# Patient Record
Sex: Male | Born: 1945
Health system: Southern US, Community
[De-identification: ages and names within clinical notes are randomized; demographics above are authoritative.]

## PROBLEM LIST (undated history)

## (undated) DIAGNOSIS — E7800 Pure hypercholesterolemia, unspecified: Secondary | ICD-10-CM

## (undated) DIAGNOSIS — M5030 Other cervical disc degeneration, unspecified cervical region: Secondary | ICD-10-CM

## (undated) DIAGNOSIS — K635 Polyp of colon: Secondary | ICD-10-CM

## (undated) DIAGNOSIS — D45 Polycythemia vera: Secondary | ICD-10-CM

## (undated) DIAGNOSIS — G43909 Migraine, unspecified, not intractable, without status migrainosus: Secondary | ICD-10-CM

## (undated) DIAGNOSIS — N400 Enlarged prostate without lower urinary tract symptoms: Secondary | ICD-10-CM

## (undated) HISTORY — PX: EYE SURGERY: SHX253

---

## 2015-06-30 ENCOUNTER — Ambulatory Visit
Admission: RE | Admit: 2015-06-30 | Discharge: 2015-06-30 | Disposition: A | Discharge status: Home / Self Care | Payer: Medicare Other | Source: Ambulatory Visit | Attending: Family Medicine | Admitting: Family Medicine

## 2015-06-30 ENCOUNTER — Other Ambulatory Visit: Payer: Self-pay | Admitting: Family Medicine

## 2015-06-30 DIAGNOSIS — M549 Dorsalgia, unspecified: Secondary | ICD-10-CM

## 2015-07-14 ENCOUNTER — Other Ambulatory Visit: Payer: Self-pay | Admitting: Family Medicine

## 2015-07-14 DIAGNOSIS — M5030 Other cervical disc degeneration, unspecified cervical region: Secondary | ICD-10-CM

## 2015-07-15 ENCOUNTER — Ambulatory Visit
Admission: RE | Admit: 2015-07-15 | Discharge: 2015-07-15 | Disposition: A | Discharge status: Home / Self Care | Payer: Medicare Other | Source: Ambulatory Visit | Attending: Family Medicine | Admitting: Family Medicine

## 2015-07-15 ENCOUNTER — Other Ambulatory Visit: Payer: Self-pay | Admitting: Family Medicine

## 2015-07-15 DIAGNOSIS — T1592XA Foreign body on external eye, part unspecified, left eye, initial encounter: Secondary | ICD-10-CM

## 2015-07-15 DIAGNOSIS — M5030 Other cervical disc degeneration, unspecified cervical region: Secondary | ICD-10-CM

## 2015-11-14 ENCOUNTER — Encounter (HOSPITAL_COMMUNITY): Payer: Self-pay | Admitting: *Deleted

## 2015-11-14 ENCOUNTER — Emergency Department (HOSPITAL_COMMUNITY): Payer: Medicare Other

## 2015-11-14 ENCOUNTER — Emergency Department (HOSPITAL_COMMUNITY)
Admission: EM | Admit: 2015-11-14 | Discharge: 2015-11-14 | Disposition: A | Discharge status: Home / Self Care | Payer: Medicare Other | Attending: Emergency Medicine | Admitting: Emergency Medicine

## 2015-11-14 DIAGNOSIS — N451 Epididymitis: Secondary | ICD-10-CM | POA: Diagnosis not present

## 2015-11-14 DIAGNOSIS — N50812 Left testicular pain: Secondary | ICD-10-CM | POA: Diagnosis present

## 2015-11-14 DIAGNOSIS — R52 Pain, unspecified: Secondary | ICD-10-CM

## 2015-11-14 LAB — URINALYSIS, ROUTINE W REFLEX MICROSCOPIC
Glucose, UA: NEGATIVE mg/dL
Hgb urine dipstick: NEGATIVE
Ketones, ur: 15 mg/dL — AB
LEUKOCYTES UA: NEGATIVE
NITRITE: NEGATIVE
PH: 6 (ref 5.0–8.0)
Protein, ur: 30 mg/dL — AB
SPECIFIC GRAVITY, URINE: 1.035 — AB (ref 1.005–1.030)

## 2015-11-14 LAB — URINE MICROSCOPIC-ADD ON: RBC / HPF: NONE SEEN RBC/hpf (ref 0–5)

## 2015-11-14 MED ORDER — CEPHALEXIN 500 MG PO CAPS: 500.0000 mg | ORAL_CAPSULE | Freq: Four times a day (QID) | ORAL | Status: DC

## 2015-11-14 MED ORDER — MORPHINE SULFATE (PF) 4 MG/ML IV SOLN
4.0000 mg | Freq: Once | INTRAVENOUS | Status: AC
  Administered 2015-11-14: 4 mg via INTRAVENOUS
  Filled 2015-11-14: qty 1

## 2015-11-14 MED ORDER — CEPHALEXIN 250 MG PO CAPS
500.0000 mg | ORAL_CAPSULE | Freq: Once | ORAL | Status: AC
  Administered 2015-11-14: 500 mg via ORAL
  Filled 2015-11-14: qty 2

## 2015-11-14 MED ORDER — HYDROCODONE-ACETAMINOPHEN 5-325 MG PO TABS: 1.0000 | ORAL_TABLET | Freq: Four times a day (QID) | ORAL | Status: DC | PRN

## 2015-11-14 MED ORDER — ONDANSETRON HCL 4 MG/2ML IJ SOLN
4.0000 mg | Freq: Once | INTRAMUSCULAR | Status: AC
  Administered 2015-11-14: 4 mg via INTRAVENOUS
  Filled 2015-11-14: qty 2

## 2015-11-14 MED ORDER — SODIUM CHLORIDE 0.9 % IV SOLN: INTRAVENOUS | Status: DC

## 2015-11-14 MED ORDER — HYDROCODONE-ACETAMINOPHEN 5-325 MG PO TABS
1.0000 | ORAL_TABLET | Freq: Once | ORAL | Status: AC
  Administered 2015-11-14: 1 via ORAL
  Filled 2015-11-14: qty 1

## 2015-11-14 NOTE — ED Notes (Signed)
Pt reports scrotal swelling for several days. Pt states that it has worsened today and he has had difficulty urinating since this morning as well.

## 2015-11-14 NOTE — ED Provider Notes (Signed)
CSN: [id number]     Arrival date & time 11/14/15  1025 History   First MD Initiated Contact with Patient 11/14/15 1110     Chief Complaint  Patient presents with  . Testicle Pain     (Consider location/radiation/quality/duration/timing/severity/associated sxs/prior Treatment) Patient is a 69 y.o. male presenting with testicular pain. The history is provided by the patient.  Testicle Pain Pertinent negatives include no chest pain, no abdominal pain, no headaches and no shortness of breath.  Patient c/o left testicle pain for the past couple days. Symptoms gradual in onset, constant, persistent, moderate-severe. No hx same pain. No trauma to area. No dysuria, although states at times feels as if not emptying bladder fully. No abd distension. No nv. No fever or chills.       History reviewed. No pertinent past medical history. Past Surgical History  Procedure Laterality Date  . Eye surgery     No family history on file. Social History  Substance Use Topics  . Smoking status: Never Smoker   . Smokeless tobacco: None  . Alcohol Use: None    Review of Systems  Constitutional: Negative for fever and chills.  HENT: Negative for sore throat.   Eyes: Negative for redness.  Respiratory: Negative for shortness of breath.   Cardiovascular: Negative for chest pain.  Gastrointestinal: Negative for vomiting, abdominal pain and diarrhea.  Genitourinary: Positive for testicular pain. Negative for dysuria and flank pain.  Musculoskeletal: Negative for back pain and neck pain.  Skin: Negative for rash.  Neurological: Negative for headaches.  Hematological: Does not bruise/bleed easily.  Psychiatric/Behavioral: Negative for confusion.      Allergies  Review of patient's allergies indicates no known allergies.  Home Medications   Prior to Admission medications   Not on File   BP 110/79 mmHg  Pulse 62  Temp(Src) 97.3 F (36.3 C) (Axillary)  Resp 18  SpO2 100% Physical Exam   Constitutional: He is oriented to person, place, and time. He appears well-developed and well-nourished. No distress.  HENT:  Mouth/Throat: Oropharynx is clear and moist.  Eyes: Conjunctivae are normal.  Neck: Neck supple. No tracheal deviation present.  Cardiovascular: Normal rate.   Pulmonary/Chest: Effort normal. No accessory muscle usage. No respiratory distress.  Abdominal: Soft. Bowel sounds are normal. He exhibits no distension and no mass. There is no tenderness. There is no rebound and no guarding.  Genitourinary:  Normal ext genitalia. Left testicle tender diffusely and ?esp epididymus, ?mild swelling.  Musculoskeletal: Normal range of motion.  Neurological: He is alert and oriented to person, place, and time.  Skin: Skin is warm and dry. No rash noted. He is not diaphoretic.  Psychiatric: He has a normal mood and affect.  Nursing note and vitals reviewed.   ED Course  Procedures (including critical care time) Labs Review  Results for orders placed or performed during the hospital encounter of 11/14/15  Urinalysis, Routine w reflex microscopic (not at [facility])  Result Value Ref Range   Color, Urine AMBER (A) YELLOW   APPearance CLEAR CLEAR   Specific Gravity, Urine 1.035 (H) 1.005 - 1.030   pH 6.0 5.0 - 8.0   Glucose, UA NEGATIVE NEGATIVE mg/dL   Hgb urine dipstick NEGATIVE NEGATIVE   Bilirubin Urine SMALL (A) NEGATIVE   Ketones, ur 15 (A) NEGATIVE mg/dL   Protein, ur 30 (A) NEGATIVE mg/dL   Nitrite NEGATIVE NEGATIVE   Leukocytes, UA NEGATIVE NEGATIVE  Urine microscopic-add on  Result Value Ref Range   Squamous Epithelial /  LPF 0-5 (A) NONE SEEN   WBC, UA 0-5 0 - 5 WBC/hpf   RBC / HPF NONE SEEN 0 - 5 RBC/hpf   Bacteria, UA FEW (A) NONE SEEN   Urine-Other MUCOUS PRESENT    US Scrotum  11/14/2015  CLINICAL DATA:  Left testicular pain for 3 days EXAM: ULTRASOUND OF SCROTUM TECHNIQUE: Complete ultrasound examination of the testicles, epididymis, and other scrotal  structures was performed. COMPARISON:  None. FINDINGS: Right testicle Measurements: 4.9 x 2.6 x 3.1 cm. No mass or microlithiasis visualized. Left testicle Measurements: 4.2 x 2.4 x 3.1 cm. No mass or microlithiasis visualized. Right epididymis:  Normal in size and appearance. Left epididymis: The left epididymal tail is enlarged without increased flow. Hydrocele:  There is a right hydrocele. Varicocele:  There is a left varicocele. IMPRESSION: Normal bilateral testis. Left epididymal tail is enlarged without increased flow to suggest epididymitis. This is nonspecific. Right hydrocele.  Left varicocele. Electronically Signed   By: Abelardo Diesel M.D.   On: 11/14/2015 11:58   [name] Art/ven Flow Abd Pelv Doppler  11/14/2015  CLINICAL DATA:  Left testicular pain for 3 days EXAM: ULTRASOUND OF SCROTUM TECHNIQUE: Complete ultrasound examination of the testicles, epididymis, and other scrotal structures was performed. COMPARISON:  None. FINDINGS: Right testicle Measurements: 4.9 x 2.6 x 3.1 cm. No mass or microlithiasis visualized. Left testicle Measurements: 4.2 x 2.4 x 3.1 cm. No mass or microlithiasis visualized. Right epididymis:  Normal in size and appearance. Left epididymis: The left epididymal tail is enlarged without increased flow. Hydrocele:  There is a right hydrocele. Varicocele:  There is a left varicocele. IMPRESSION: Normal bilateral testis. Left epididymal tail is enlarged without increased flow to suggest epididymitis. This is nonspecific. Right hydrocele.  Left varicocele. Electronically Signed   By: Abelardo Diesel M.D.   On: 11/14/2015 11:58      I have personally reviewed and evaluated these images and lab results as part of my medical decision-making.    MDM   Iv ns. Pt requests pain med. Morphine iv. zofran iv.  Ultrasound.  Reviewed nursing notes and prior charts for additional history.   Bacteria in urine, and markedly tender left epididymus - will culture urine and rx.   U/s neg  for torsion.  Recheck pain improved.  No nv.    Recheck pt.  No abd or flank pain. abd soft nt. Afeb. Pt indicates feels ready for d/c.       Lajean Saver, MD 11/14/15 1359

## 2015-11-14 NOTE — Discharge Instructions (Signed)
It was our pleasure to provide your ER care today - we hope that you feel better.  Drink plenty of fluids.  Take motrin or aleve as need for pain. You may also take hydrocodone as need for pain. No driving when taking hydrocodone. Also, do not take tylenol or acetaminophen containing medication when taking hydrocodone.  Take keflex (antibiotic) as prescribed.  Follow up with your doctor in the next couple days for recheck if symptoms fail to improve/resolve.  Return to ER right away if worse, new symptoms, fevers, severe or intractable pain, severe abdominal or flank pain, other concern.  You were given pain medication in the ER - no driving for the next 4 hours.    Epididymitis Epididymitis is swelling (inflammation) of the epididymis. The epididymis is a cord-like structure that is located along the top and back part of the testicle. It collects and stores sperm from the testicle. This condition can also cause pain and swelling of the testicle and scrotum. Symptoms usually start suddenly (acute epididymitis). Sometimes epididymitis starts gradually and lasts for a while (chronic epididymitis). This type may be harder to treat. CAUSES In men 93 and younger, this condition is usually caused by a bacterial infection or sexually transmitted disease (STD), such as:  Gonorrhea.  Chlamydia.  In men 77 and older who do not have anal sex, this condition is usually caused by bacteria from a blockage or abnormalities in the urinary system. These can result from:  Having a tube placed into the bladder (urinary catheter).  Having an enlarged or inflamed prostate gland.  Having recent urinary tract surgery. In men who have a condition that weakens the body's defense system (immune system), such as HIV, this condition can be caused by:   Other bacteria, including tuberculosis and syphilis.  Viruses.  Fungi. Sometimes this condition occurs without infection. That may happen if urine flows  backward into the epididymis after heavy lifting or straining. RISK FACTORS This condition is more likely to develop in men:  Who have unprotected sex with more than one partner.  Who have anal sex.   Who have recently had surgery.   Who have a urinary catheter.  Who have urinary problems.  Who have a suppressed immune system. SYMPTOMS  This condition usually begins suddenly with chills, fever, and pain behind the scrotum and in the testicle. Other symptoms include:   Swelling of the scrotum, testicle, or both.  Pain whenejaculatingor urinating.  Pain in the back or belly.  Nausea.  Itching and discharge from the penis.  Frequent need to pass urine.  Redness and tenderness of the scrotum. DIAGNOSIS Your health care provider can diagnose this condition based on your symptoms and medical history. Your health care provider will also do a physical exam to ask about your symptoms and check your scrotum and testicle for swelling, pain, and redness. You may also have other tests, including:   Examination of discharge from the penis.  Urine tests for infections, such as STDs.  Your health care provider may test you for other STDs, including HIV. TREATMENT Treatment for this condition depends on the cause. If your condition is caused by a bacterial infection, oral antibiotic medicine may be prescribed. If the bacterial infection has spread to your blood, you may need to receive IV antibiotics. Nonbacterial epididymitis is treated with home care that includes bed rest and elevation of the scrotum. Surgery may be needed to treat:  Bacterial epididymitis that causes pus to build up in the  scrotum (abscess).  Chronic epididymitis that has not responded to other treatments. HOME CARE INSTRUCTIONS Medicines  Take over-the-counter and prescription medicines only as told by your health care provider.   If you were prescribed an antibiotic medicine, take it as told by your  health care provider. Do not stop taking the antibiotic even if your condition improves. Sexual Activity  If your epididymitis was caused by an STD, avoid sexual activity until your treatment is complete.  Inform your sexual partner or partners if you test positive for an STD. They may need to be treated.Do not engage in sexual activity with your partner or partners until their treatment is completed. General Instructions  Return to your normal activities as told by your health care provider. Ask your health care provider what activities are safe for you.  Keep your scrotum elevated and supported while resting. Ask your health care provider if you should wear a scrotal support, such as a jockstrap. Wear it as told by your health care provider.  If directed, apply ice to the affected area:   Put ice in a plastic bag.  Place a towel between your skin and the bag.  Leave the ice on for 20 minutes, 2-3 times per day.  Try taking a sitz bath to help with discomfort. This is a warm water bath that is taken while you are sitting down. The water should only come up to your hips and should cover your buttocks. Do this 3-4 times per day or as told by your health care provider.  Keep all follow-up visits as told by your health care provider. This is important. SEEK MEDICAL CARE IF:   You have a fever.   Your pain medicine is not helping.   Your pain is getting worse.   Your symptoms do not improve within three days.   This information is not intended to replace advice given to you by your health care provider. Make sure you discuss any questions you have with your health care provider.   Document Released: 11/02/2000 Document Revised: 07/27/2015 Document Reviewed: 03/23/2015 Elsevier Interactive Patient Education [employer].

## 2015-11-14 NOTE — ED Notes (Signed)
Returned from U/S

## 2015-11-14 NOTE — ED Notes (Signed)
Patient transported to Ultrasound 

## 2015-11-15 LAB — URINE CULTURE: CULTURE: NO GROWTH

## 2019-10-23 ENCOUNTER — Inpatient Hospital Stay (HOSPITAL_BASED_OUTPATIENT_CLINIC_OR_DEPARTMENT_OTHER)
Admission: EM | Admit: 2019-10-23 | Discharge: 2019-10-27 | DRG: 092 | Disposition: A | Discharge status: Home / Self Care | Payer: Medicare Other | Attending: Internal Medicine | Admitting: Internal Medicine

## 2019-10-23 ENCOUNTER — Emergency Department (HOSPITAL_BASED_OUTPATIENT_CLINIC_OR_DEPARTMENT_OTHER): Payer: Medicare Other

## 2019-10-23 ENCOUNTER — Encounter (HOSPITAL_BASED_OUTPATIENT_CLINIC_OR_DEPARTMENT_OTHER): Payer: Self-pay

## 2019-10-23 ENCOUNTER — Other Ambulatory Visit: Payer: Self-pay

## 2019-10-23 DIAGNOSIS — E785 Hyperlipidemia, unspecified: Secondary | ICD-10-CM | POA: Diagnosis present

## 2019-10-23 DIAGNOSIS — M5030 Other cervical disc degeneration, unspecified cervical region: Secondary | ICD-10-CM | POA: Diagnosis present

## 2019-10-23 DIAGNOSIS — D473 Essential (hemorrhagic) thrombocythemia: Secondary | ICD-10-CM | POA: Diagnosis present

## 2019-10-23 DIAGNOSIS — Z79891 Long term (current) use of opiate analgesic: Secondary | ICD-10-CM | POA: Diagnosis not present

## 2019-10-23 DIAGNOSIS — R42 Dizziness and giddiness: Secondary | ICD-10-CM | POA: Diagnosis not present

## 2019-10-23 DIAGNOSIS — G43909 Migraine, unspecified, not intractable, without status migrainosus: Secondary | ICD-10-CM | POA: Diagnosis present

## 2019-10-23 DIAGNOSIS — Z791 Long term (current) use of non-steroidal anti-inflammatories (NSAID): Secondary | ICD-10-CM

## 2019-10-23 DIAGNOSIS — N183 Chronic kidney disease, stage 3 unspecified: Secondary | ICD-10-CM | POA: Diagnosis present

## 2019-10-23 DIAGNOSIS — S40022A Contusion of left upper arm, initial encounter: Secondary | ICD-10-CM

## 2019-10-23 DIAGNOSIS — G08 Intracranial and intraspinal phlebitis and thrombophlebitis: Principal | ICD-10-CM | POA: Diagnosis present

## 2019-10-23 DIAGNOSIS — I161 Hypertensive emergency: Secondary | ICD-10-CM | POA: Diagnosis present

## 2019-10-23 DIAGNOSIS — D45 Polycythemia vera: Secondary | ICD-10-CM | POA: Diagnosis present

## 2019-10-23 DIAGNOSIS — D708 Other neutropenia: Secondary | ICD-10-CM | POA: Diagnosis present

## 2019-10-23 DIAGNOSIS — X58XXXA Exposure to other specified factors, initial encounter: Secondary | ICD-10-CM | POA: Diagnosis present

## 2019-10-23 DIAGNOSIS — E7800 Pure hypercholesterolemia, unspecified: Secondary | ICD-10-CM | POA: Diagnosis present

## 2019-10-23 DIAGNOSIS — N179 Acute kidney failure, unspecified: Secondary | ICD-10-CM | POA: Diagnosis not present

## 2019-10-23 DIAGNOSIS — D471 Chronic myeloproliferative disease: Secondary | ICD-10-CM

## 2019-10-23 DIAGNOSIS — Z86010 Personal history of colonic polyps: Secondary | ICD-10-CM | POA: Diagnosis not present

## 2019-10-23 DIAGNOSIS — Z20828 Contact with and (suspected) exposure to other viral communicable diseases: Secondary | ICD-10-CM | POA: Diagnosis present

## 2019-10-23 DIAGNOSIS — Z79899 Other long term (current) drug therapy: Secondary | ICD-10-CM

## 2019-10-23 DIAGNOSIS — N400 Enlarged prostate without lower urinary tract symptoms: Secondary | ICD-10-CM | POA: Diagnosis present

## 2019-10-23 DIAGNOSIS — D75839 Thrombocytosis, unspecified: Secondary | ICD-10-CM

## 2019-10-23 HISTORY — DX: Other cervical disc degeneration, unspecified cervical region: M50.30

## 2019-10-23 HISTORY — DX: Migraine, unspecified, not intractable, without status migrainosus: G43.909

## 2019-10-23 HISTORY — DX: Pure hypercholesterolemia, unspecified: E78.00

## 2019-10-23 HISTORY — DX: Benign prostatic hyperplasia without lower urinary tract symptoms: N40.0

## 2019-10-23 HISTORY — DX: Polycythemia vera: D45

## 2019-10-23 HISTORY — DX: Polyp of colon: K63.5

## 2019-10-23 LAB — CBC WITH DIFFERENTIAL/PLATELET
Abs Immature Granulocytes: 0.26 10*3/uL — ABNORMAL HIGH (ref 0.00–0.07)
Basophils Absolute: 0.8 10*3/uL — ABNORMAL HIGH (ref 0.0–0.1)
Basophils Relative: 5 %
Eosinophils Absolute: 1.1 10*3/uL — ABNORMAL HIGH (ref 0.0–0.5)
Eosinophils Relative: 7 %
HCT: 51.7 % (ref 39.0–52.0)
Hemoglobin: 14.7 g/dL (ref 13.0–17.0)
Immature Granulocytes: 2 %
Lymphocytes Relative: 8 %
Lymphs Abs: 1.3 10*3/uL (ref 0.7–4.0)
MCH: 19.5 pg — ABNORMAL LOW (ref 26.0–34.0)
MCHC: 28.4 g/dL — ABNORMAL LOW (ref 30.0–36.0)
MCV: 68.8 fL — ABNORMAL LOW (ref 80.0–100.0)
Monocytes Absolute: 1.4 10*3/uL — ABNORMAL HIGH (ref 0.1–1.0)
Monocytes Relative: 9 %
Neutro Abs: 11.2 10*3/uL — ABNORMAL HIGH (ref 1.7–7.7)
Neutrophils Relative %: 69 %
Platelets: 1005 10*3/uL (ref 150–400)
RBC: 7.52 MIL/uL — ABNORMAL HIGH (ref 4.22–5.81)
RDW: 27.1 % — ABNORMAL HIGH (ref 11.5–15.5)
Smear Review: INCREASED
WBC: 16.1 10*3/uL — ABNORMAL HIGH (ref 4.0–10.5)
nRBC: 0.8 % — ABNORMAL HIGH (ref 0.0–0.2)

## 2019-10-23 LAB — PROTIME-INR
INR: 1.1 (ref 0.8–1.2)
Prothrombin Time: 14.3 seconds (ref 11.4–15.2)

## 2019-10-23 LAB — BASIC METABOLIC PANEL
Anion gap: 8 (ref 5–15)
BUN: 28 mg/dL — ABNORMAL HIGH (ref 8–23)
CO2: 23 mmol/L (ref 22–32)
Calcium: 9.2 mg/dL (ref 8.9–10.3)
Chloride: 105 mmol/L (ref 98–111)
Creatinine, Ser: 1.51 mg/dL — ABNORMAL HIGH (ref 0.61–1.24)
GFR calc Af Amer: 53 mL/min — ABNORMAL LOW (ref >60)
GFR calc non Af Amer: 45 mL/min — ABNORMAL LOW (ref >60)
Glucose, Bld: 99 mg/dL (ref 70–99)
Potassium: 4.2 mmol/L (ref 3.5–5.1)
Sodium: 136 mmol/L (ref 135–145)

## 2019-10-23 LAB — SARS CORONAVIRUS 2 AG (30 MIN TAT): SARS Coronavirus 2 Ag: NEGATIVE

## 2019-10-23 MED ORDER — IOHEXOL 300 MG/ML  SOLN
100.0000 mL | Freq: Once | INTRAMUSCULAR | Status: AC | PRN
  Administered 2019-10-23: 19:00:00 75 mL via INTRAVENOUS

## 2019-10-23 NOTE — ED Provider Notes (Signed)
[facility] EMERGENCY DEPARTMENT Provider Note   CSN: [id number] Arrival date & time: 10/23/19  1323     History   Chief Complaint Chief Complaint  Patient presents with  . Arm Problem    HPI Dillon Hill is a 73 y.o. male.  He is here with complaints of left arm pain and swelling that began 4 to 5 days ago.  He does not recall any trauma.  The pain is primarily at his inner elbow but extends into his forearm and up into the biceps.  There is a large area of bruising throughout this area.  He went to [facility] urgent care today who referred him here with concerns of a blood clot.  He has no prior history of blood clots and is not on blood thinners.  No fevers or chills no chest pain or shortness of breath.  Has not noticed any other bleeding from gums or blood in his stool or urine.     The history is provided by the patient.  Arm Injury Location:  Arm and elbow Arm location:  L forearm and L upper arm Elbow location:  L elbow Injury: no   Pain details:    Quality:  Aching   Severity:  Moderate   Onset quality:  Sudden   Duration:  4 days   Timing:  Constant   Progression:  Unchanged Relieved by:  None tried Worsened by:  Movement Ineffective treatments:  None tried Associated symptoms: swelling   Associated symptoms: no fever, no muscle weakness, no neck pain, no numbness and no tingling     Past Medical History:  Diagnosis Date  . BPH (benign prostatic hyperplasia)   . Colon polyp   . DDD (degenerative disc disease), cervical   . High cholesterol   . Migraine     There are no active problems to display for this patient.   Past Surgical History:  Procedure Laterality Date  . EYE SURGERY          Home Medications    Prior to Admission medications   Medication Sig Start Date End Date Taking? Authorizing Provider  cephALEXin (KEFLEX) 500 MG capsule Take 1 capsule (500 mg total) by mouth 4 (four) times daily. 11/14/15   Lajean Saver, MD   HYDROcodone-acetaminophen (NORCO/VICODIN) 5-325 MG tablet Take 1-2 tablets by mouth every 6 (six) hours as needed for moderate pain. 11/14/15   Lajean Saver, MD  SUMAtriptan (IMITREX) 100 MG tablet Take 100 mg by mouth as needed for migraine or headache.  10/21/15   [provider]  topiramate (TOPAMAX) 50 MG tablet Take 50 mg by mouth daily. 11/11/15   [provider]    Family History No family history on file.  Social History Social History   Tobacco Use  . Smoking status: Never Smoker  . Smokeless tobacco: Never Used  . Tobacco comment: smoked x 1 year at age 65  Substance Use Topics  . Alcohol use: Yes    Comment: occ  . Drug use: Never     Allergies   Patient has no known allergies.   Review of Systems Review of Systems  Constitutional: Negative for fever.  HENT: Negative for sore throat.   Eyes: Negative for visual disturbance.  Respiratory: Negative for shortness of breath.   Cardiovascular: Negative for chest pain.  Gastrointestinal: Negative for abdominal pain.  Genitourinary: Negative for dysuria.  Musculoskeletal: Negative for neck pain.  Skin: Positive for color change. Negative for rash.  Neurological: Negative  for headaches.     Physical Exam Updated Vital Signs BP (!) 164/116 (BP Location: Right Arm)   Pulse 80   Temp 98.7 F (37.1 C) (Oral)   Resp 14   Ht 5\' 11"  (1.803 m)   Wt 70.3 kg   SpO2 97%   BMI 21.62 kg/m   Physical Exam Vitals signs and nursing note reviewed.  Constitutional:      Appearance: He is well-developed.  HENT:     Head: Normocephalic and atraumatic.  Eyes:     Conjunctiva/sclera: Conjunctivae normal.  Neck:     Musculoskeletal: Neck supple.  Cardiovascular:     Rate and Rhythm: Normal rate and regular rhythm.     Heart sounds: No murmur.  Pulmonary:     Effort: Pulmonary effort is normal. No respiratory distress.     Breath sounds: Normal breath sounds.  Abdominal:     Palpations: Abdomen is  soft.     Tenderness: There is no abdominal tenderness.  Musculoskeletal: Normal range of motion.        General: Tenderness present.     Comments: He has some tenderness and bruising throughout his medial upper arm medial elbow and forearm.  Full range of motion.  Distal pulses and sensation intact.  Cap refill brisk.  Skin:    General: Skin is warm and dry.     Capillary Refill: Capillary refill takes less than 2 seconds.     Findings: Bruising present.  Neurological:     General: No focal deficit present.     Mental Status: He is alert.     Sensory: No sensory deficit.     Motor: No weakness.      ED Treatments / Results  Labs (all labs ordered are listed, but only abnormal results are displayed) Labs Reviewed  BASIC METABOLIC PANEL - Abnormal; Notable for the following components:      Result Value   BUN 28 (*)    Creatinine, Ser 1.51 (*)    GFR calc non Af Amer 45 (*)    GFR calc Af Amer 53 (*)    All other components within normal limits  CBC WITH DIFFERENTIAL/PLATELET - Abnormal; Notable for the following components:   WBC 16.1 (*)    RBC 7.52 (*)    MCV 68.8 (*)    MCH 19.5 (*)    MCHC 28.4 (*)    RDW 27.1 (*)    Platelets 1,005 (*)    nRBC 0.8 (*)    Neutro Abs 11.2 (*)    Monocytes Absolute 1.4 (*)    Eosinophils Absolute 1.1 (*)    Basophils Absolute 0.8 (*)    Abs Immature Granulocytes 0.26 (*)    All other components within normal limits  PROTIME-INR  PATHOLOGIST SMEAR REVIEW    EKG None  Radiology Dg Elbow Complete Left  Result Date: 10/23/2019 CLINICAL DATA:  Arm pain and swelling EXAM: LEFT ELBOW - COMPLETE 3+ VIEW COMPARISON:  None. FINDINGS: No fracture or malalignment. No significant elbow effusion. Diffuse soft tissue edema IMPRESSION: No acute osseous abnormality Electronically Signed   By: Donavan Foil M.D.   On: 10/23/2019 16:48   US Venous Img Upper Left (dvt Study)  Result Date: 10/23/2019 CLINICAL DATA:  Bruising and swelling of  left arm from the biceps to the wrist for past 5 days. Evaluate for DVT. EXAM: LEFT UPPER EXTREMITY VENOUS DOPPLER ULTRASOUND TECHNIQUE: Gray-scale sonography with graded compression, as well as color Doppler and duplex ultrasound were  performed to evaluate the upper extremity deep venous system from the level of the subclavian vein and including the jugular, axillary, basilic, radial, ulnar and upper cephalic vein. Spectral Doppler was utilized to evaluate flow at rest and with distal augmentation maneuvers. COMPARISON:  None. FINDINGS: Contralateral Subclavian Vein: Respiratory phasicity is normal and symmetric with the symptomatic side. No evidence of thrombus. Normal compressibility. Internal Jugular Vein: No evidence of thrombus. Normal compressibility, respiratory phasicity and response to augmentation. Subclavian Vein: No evidence of thrombus. Normal compressibility, respiratory phasicity and response to augmentation. Axillary Vein: No evidence of thrombus. Normal compressibility, respiratory phasicity and response to augmentation. Cephalic Vein: No evidence of thrombus. Normal compressibility, respiratory phasicity and response to augmentation. Basilic Vein: No evidence of thrombus. Normal compressibility, respiratory phasicity and response to augmentation. Brachial Veins: No evidence of thrombus. Normal compressibility, respiratory phasicity and response to augmentation. Radial Veins: No evidence of thrombus. Normal compressibility, respiratory phasicity and response to augmentation. Ulnar Veins: No evidence of thrombus. Normal compressibility, respiratory phasicity and response to augmentation. Venous Reflux:  None visualized. Other Findings: There are 2 adjacent soft tissue nodules at the mid humeral aspect of the left upper arm associated with the adjacent bicipital musculature with dominant nodule measuring approximately 2.9 x 2.1 cm and smaller adjacent nodule measuring 1.3 x 1.6 x 1.1 cm. IMPRESSION:  1. No evidence of DVT within the left upper extremity. 2. Soft tissue nodules at the mid humeral aspect of the left upper arm associated with the bicipital musculature, nonspecific though could represent a bicipital muscular tear (either partial or complete) with associated hematoma. Clinical correlation is advised. Electronically Signed   By: Sandi Mariscal M.D.   On: 10/23/2019 17:00    Procedures Procedures (including critical care time)  Medications Ordered in ED Medications - No data to display   Initial Impression / Assessment and Plan / ED Course  I have reviewed the triage vital signs and the nursing notes.  Pertinent labs & imaging results that were available during my care of the patient were reviewed by me and considered in my medical decision making (see chart for details).  Clinical Course as of Oct 22 1758  Fri Oct 23, 2019  1409 Patient here with acute onset of atraumatic left elbow and arm pain with bruising.  Differential includes unrealized trauma, hematoma, less likely DVT.  Arterial injury unlikely as has great distal pulses and sensation.   [MB]  [id number] Patient's creatinine is at 1.5 unclear baseline no prior labs.  CBC coming back with elevated white count normal hemoglobin and markedly elevated platelets.  Patient signed out to oncoming physician Dr. Sherry Ruffing to follow-up on ultrasound and probably to communicate with heme-onc regarding recommendations.   [MB]    Clinical Course User Index [MB] Hayden Rasmussen, MD       Final Clinical Impressions(s) / ED Diagnoses   Final diagnoses:  Hematoma of arm, left, initial encounter  Thrombocythemia [facility])    ED Discharge Orders    None       Hayden Rasmussen, MD 10/23/19 1800

## 2019-10-23 NOTE — ED Notes (Signed)
Patient transported to CT 

## 2019-10-23 NOTE — ED Triage Notes (Signed)
Pt c/o pain to left arm x 5 days-bruising/swelling x 4 days-denies injury-sent from [facility] to r/o blood clot per pt-NAD-steady gait

## 2019-10-23 NOTE — ED Notes (Signed)
  Patient ambulated to restroom.  Patient tolerated well and required no assistance.

## 2019-10-23 NOTE — ED Notes (Signed)
ED Provider at bedside. 

## 2019-10-23 NOTE — ED Provider Notes (Signed)
3:19 PM Care assumed from Dr. Melina Copa.  At time of transfer of care, patient is awaiting results of x-ray and ultrasound of the upper extremity to help determine etiology of the patient's bruising and arm pain.  Patient had some lab work that returned showing elevated creatinine 1.51 although there is no baseline to compare for AKI.  CBC also is abnormal with platelets elevated over 1 million and white blood cell count elevated.  No elevation in hemoglobin.  INR is normal.  Anticipate touching base with hematology oncology to determine follow-up recommendations for the elevated cell lines.  If DVT ultrasound is positive, anticipate admission for further work-up of the blood abnormalities and anticoagulation in the setting of possibly elevated creatinine.  6:30 PM  Patient's ultrasound of the arm did not show DVT and showed what is likely a hematoma related to muscle tear in the biceps.  This fits with the patient's bruising and pain.  However, patient's lab abnormalities were concerning so I spoke with hematology to further discuss the plan.  Just spoke with hematology/oncology with Dr. Irene Limbo who recommends the patient be admitted to [facility] long for hematology evaluation tomorrow morning and to figure out his thrombocytosis.  They are concerned about a possible blood malignancy.  I informed the patient of this concern and he also would like to be worked up because he says his blood pressures have been elevated in the 190s today when he does not have high blood pressure normally and is also had headache for the last several weeks which is new.  In the setting of this new thrombocytosis, will get CTV to rule out a venous sinus thrombosis.  Once CT has returned, anticipate admission to [facility] long for further work-up.  8:22 PM Radiologist called to report the CTV was concerning for possible dural venous sinus thrombosis.  They did not feel completely confident and recommended MRI with and without contrast  and MRV to evaluate tonight.  Given his need for MRI overnight, will likely call to admit at [facility] instead of [place] [facility] given the likely neurology involvement.  We will touch base with neurology to confirm plan.  8:37 PM Neurology feels that patient would best be served at [place] long so the oncology team can see him.  As his headache is gone on for several weeks, they did not think emergent anticoagulation was needed until he has the MR the and MRI with and without contrast.  Neurology did not feel ED to ED transfer to [facility] was needed at this time.  They recommended admission to [facility] long as we can get his evaluation by [place] and MRIs in the morning or overnight if he gets to [place] in time.   Medicine team will be called at [facility] long for admission.  Patient will be admitted to [facility] long for further work-up.    Clinical Impression: 1. Hematoma of arm, left, initial encounter   2. Thrombocythemia ([place])     Disposition: Admit  This note was prepared with assistance of Dragon voice recognition software. Occasional wrong-word or sound-a-like substitutions may have occurred due to the inherent limitations of voice recognition software.     , Gwenyth Allegra, MD 10/23/19 [phone]

## 2019-10-24 ENCOUNTER — Inpatient Hospital Stay (HOSPITAL_COMMUNITY): Payer: Medicare Other

## 2019-10-24 ENCOUNTER — Encounter: Payer: Self-pay | Admitting: Internal Medicine

## 2019-10-24 DIAGNOSIS — S40022A Contusion of left upper arm, initial encounter: Secondary | ICD-10-CM

## 2019-10-24 DIAGNOSIS — D473 Essential (hemorrhagic) thrombocythemia: Secondary | ICD-10-CM | POA: Diagnosis present

## 2019-10-24 DIAGNOSIS — D75839 Thrombocytosis, unspecified: Secondary | ICD-10-CM | POA: Diagnosis present

## 2019-10-24 DIAGNOSIS — N179 Acute kidney failure, unspecified: Secondary | ICD-10-CM

## 2019-10-24 LAB — IRON AND TIBC
Iron: 35 ug/dL — ABNORMAL LOW (ref 45–182)
Saturation Ratios: 8 % — ABNORMAL LOW (ref 17.9–39.5)
TIBC: 426 ug/dL (ref 250–450)
UIBC: 391 ug/dL

## 2019-10-24 LAB — URINALYSIS, ROUTINE W REFLEX MICROSCOPIC
Bacteria, UA: NONE SEEN
Bilirubin Urine: NEGATIVE
Glucose, UA: NEGATIVE mg/dL
Hgb urine dipstick: NEGATIVE
Ketones, ur: NEGATIVE mg/dL
Leukocytes,Ua: NEGATIVE
Nitrite: NEGATIVE
Protein, ur: 100 mg/dL — AB
Specific Gravity, Urine: 1.013 (ref 1.005–1.030)
pH: 6 (ref 5.0–8.0)

## 2019-10-24 LAB — SARS CORONAVIRUS 2 (TAT 6-24 HRS): SARS Coronavirus 2: NEGATIVE

## 2019-10-24 LAB — CBC
HCT: 53.8 % — ABNORMAL HIGH (ref 39.0–52.0)
Hemoglobin: 15 g/dL (ref 13.0–17.0)
MCH: 19.2 pg — ABNORMAL LOW (ref 26.0–34.0)
MCHC: 27.9 g/dL — ABNORMAL LOW (ref 30.0–36.0)
MCV: 69 fL — ABNORMAL LOW (ref 80.0–100.0)
Platelets: 917 10*3/uL (ref 150–400)
RBC: 7.8 MIL/uL — ABNORMAL HIGH (ref 4.22–5.81)
RDW: 26.9 % — ABNORMAL HIGH (ref 11.5–15.5)
WBC: 16.7 10*3/uL — ABNORMAL HIGH (ref 4.0–10.5)
nRBC: 0.9 % — ABNORMAL HIGH (ref 0.0–0.2)

## 2019-10-24 LAB — C-REACTIVE PROTEIN: CRP: 0.6 mg/dL (ref <1.0)

## 2019-10-24 LAB — CREATININE, SERUM
Creatinine, Ser: 1.42 mg/dL — ABNORMAL HIGH (ref 0.61–1.24)
GFR calc Af Amer: 57 mL/min — ABNORMAL LOW (ref >60)
GFR calc non Af Amer: 49 mL/min — ABNORMAL LOW (ref >60)

## 2019-10-24 LAB — SEDIMENTATION RATE: Sed Rate: 2 mm/hr (ref 0–16)

## 2019-10-24 LAB — FERRITIN: Ferritin: 31 ng/mL (ref 24–336)

## 2019-10-24 LAB — PROTIME-INR
INR: 1.2 (ref 0.8–1.2)
Prothrombin Time: 14.6 seconds (ref 11.4–15.2)

## 2019-10-24 LAB — LACTATE DEHYDROGENASE: LDH: 339 U/L — ABNORMAL HIGH (ref 98–192)

## 2019-10-24 LAB — APTT: aPTT: 43 seconds — ABNORMAL HIGH (ref 24–36)

## 2019-10-24 LAB — SODIUM, URINE, RANDOM: Sodium, Ur: 70 mmol/L

## 2019-10-24 MED ORDER — POTASSIUM CHLORIDE IN NACL 20-0.9 MEQ/L-% IV SOLN
INTRAVENOUS | Status: DC
  Administered 2019-10-24: 15:00:00 via INTRAVENOUS
  Filled 2019-10-24: qty 1000

## 2019-10-24 MED ORDER — HYDROCODONE-ACETAMINOPHEN 5-325 MG PO TABS
1.0000 | ORAL_TABLET | ORAL | Status: DC | PRN
  Administered 2019-10-24 – 2019-10-25 (×4): 1 via ORAL
  Administered 2019-10-25: 2 via ORAL
  Administered 2019-10-25 – 2019-10-26 (×2): 1 via ORAL
  Administered 2019-10-26 – 2019-10-27 (×2): 2 via ORAL
  Filled 2019-10-24 (×3): qty 1
  Filled 2019-10-24 (×2): qty 2
  Filled 2019-10-24: qty 1
  Filled 2019-10-24: qty 2
  Filled 2019-10-24 (×2): qty 1

## 2019-10-24 MED ORDER — SODIUM CHLORIDE 0.9 % IV SOLN: 250.0000 mL | INTRAVENOUS | Status: DC | PRN

## 2019-10-24 MED ORDER — HEPARIN SODIUM (PORCINE) 5000 UNIT/ML IJ SOLN
5000.0000 [IU] | Freq: Three times a day (TID) | INTRAMUSCULAR | Status: DC
  Administered 2019-10-24 (×2): 5000 [IU] via SUBCUTANEOUS
  Filled 2019-10-24 (×2): qty 1

## 2019-10-24 MED ORDER — ADULT MULTIVITAMIN W/MINERALS CH
1.0000 | ORAL_TABLET | Freq: Every day | ORAL | Status: DC
  Administered 2019-10-24 – 2019-10-27 (×4): 1 via ORAL
  Filled 2019-10-24 (×4): qty 1

## 2019-10-24 MED ORDER — HEPARIN (PORCINE) 25000 UT/250ML-% IV SOLN
850.0000 [IU]/h | INTRAVENOUS | Status: DC
  Administered 2019-10-24: 21:00:00 850 [IU]/h via INTRAVENOUS
  Filled 2019-10-24: qty 250

## 2019-10-24 MED ORDER — SODIUM CHLORIDE 0.9 % IV SOLN
INTRAVENOUS | Status: DC
  Administered 2019-10-24 – 2019-10-26 (×6): via INTRAVENOUS

## 2019-10-24 MED ORDER — ONDANSETRON HCL 4 MG/2ML IJ SOLN
4.0000 mg | Freq: Four times a day (QID) | INTRAMUSCULAR | Status: DC | PRN
  Administered 2019-10-25: 4 mg via INTRAVENOUS
  Filled 2019-10-24 (×4): qty 2

## 2019-10-24 MED ORDER — SODIUM CHLORIDE 0.9% FLUSH
3.0000 mL | Freq: Two times a day (BID) | INTRAVENOUS | Status: DC
  Administered 2019-10-26 – 2019-10-27 (×2): 3 mL via INTRAVENOUS

## 2019-10-24 MED ORDER — GADOBUTROL 1 MMOL/ML IV SOLN
7.5000 mL | Freq: Once | INTRAVENOUS | Status: AC | PRN
  Administered 2019-10-24: 17:00:00 7.5 mL via INTRAVENOUS

## 2019-10-24 MED ORDER — ASPIRIN EC 81 MG PO TBEC
81.0000 mg | DELAYED_RELEASE_TABLET | Freq: Every day | ORAL | Status: DC
  Administered 2019-10-24 – 2019-10-27 (×4): 81 mg via ORAL
  Filled 2019-10-24 (×4): qty 1

## 2019-10-24 MED ORDER — SODIUM CHLORIDE 0.9% FLUSH
3.0000 mL | Freq: Two times a day (BID) | INTRAVENOUS | Status: DC
  Administered 2019-10-26 – 2019-10-27 (×4): 3 mL via INTRAVENOUS

## 2019-10-24 MED ORDER — FOLIC ACID 1 MG PO TABS
1.0000 mg | ORAL_TABLET | Freq: Every day | ORAL | Status: DC
  Administered 2019-10-24 – 2019-10-27 (×4): 1 mg via ORAL
  Filled 2019-10-24 (×4): qty 1

## 2019-10-24 MED ORDER — SODIUM CHLORIDE 0.9% FLUSH
3.0000 mL | INTRAVENOUS | Status: DC | PRN
  Administered 2019-10-26: 10:00:00 3 mL via INTRAVENOUS
  Filled 2019-10-24: qty 3

## 2019-10-24 MED ORDER — HYDROXYUREA 500 MG PO CAPS
500.0000 mg | ORAL_CAPSULE | Freq: Two times a day (BID) | ORAL | Status: DC
  Administered 2019-10-24 – 2019-10-27 (×6): 500 mg via ORAL
  Filled 2019-10-24 (×8): qty 1

## 2019-10-24 MED ORDER — ONDANSETRON HCL 4 MG PO TABS: 4.0000 mg | ORAL_TABLET | Freq: Four times a day (QID) | ORAL | Status: DC | PRN

## 2019-10-24 NOTE — Plan of Care (Signed)
Plan of care initiated.

## 2019-10-24 NOTE — Consult Note (Signed)
[name]    HEMATOLOGY/ONCOLOGY CONSULTATION NOTE  Date of Service: 10/24/2019  Patient Care Team: [name], MD as PCP - General (Family Medicine)  CHIEF COMPLAINTS/PURPOSE OF CONSULTATION:  Severe thrombocytosis, polycythemia and leukocytosis concerning for myeloproliferative neoplasm Dural venous sinus thrombosis  HISTORY OF PRESENTING ILLNESS:   Dillon Hill is a wonderful 73 y.o. male who has been referred to [name] by Dr Hosie Poisson, MD  for evaluation and management of Severe thrombocytosis, polycythemia and leukocytosis concerning for myeloproliferative neoplasm.  Patient has a history of migraine headaches, dyslipidemia, cervical degenerative disc disease, BPH and overall poor primary care follow-up.  He presented with left arm hematoma ( no overt trauma but was moving some boxes) to [place] emergency room and had labs which showed platelets of 1005k, hematocrit of 51.7 and leukocytosis of 16.1k with ANC of 11.2k.  He apparently was also complaining of a headache occurring daily for the last 2 months occurring on both sides of his head.  He has been using ibuprofen quite frequently to address his headache. CT venogram was done which showed concerns for right sigmoid sinus dural venous sinus thrombosis. No intracranial hemorrhage or demarcated infarct noted.  Patient was admitted to [facility].  Neurology was consulted and MRI of the brain and MR venogram has been recommended.  Concern also for medication overuse headache and ibuprofen was limited.  I was consulted today by Dr. Karleen Hampshire from hospital medicine to weigh in regarding his severe thrombocytosis leukocytosis and polycythemia.  We discussed my initial concerns for a myeloproliferative neoplasm possibly polycythemia vera versus essential thrombocytosis.  Patient notes no other evidence of bleeding.  No previous history of venous thromboembolism. No family history of abnormal bleeding disorders or  clotting disorders.  No fevers/chills/night sweats/abnormal weight loss.  No acute new bone pains. No other acute new symptoms. Denies any focal neurological deficits. Denies being an active smoker.  No known history of COPD or chronic lung disease.   MEDICAL HISTORY:  Past Medical History:  Diagnosis Date   BPH (benign prostatic hyperplasia)    Colon polyp    DDD (degenerative disc disease), cervical    High cholesterol    Migraine     SURGICAL HISTORY: Past Surgical History:  Procedure Laterality Date   EYE SURGERY      SOCIAL HISTORY: Social History   Socioeconomic History   Marital status: Married    Spouse name: Not on file   Number of children: Not on file   Years of education: Not on file   Highest education level: Not on file  Occupational History   Not on file  Social Needs   Financial resource strain: Not on file   Food insecurity    Worry: Not on file    Inability: Not on file   Transportation needs    Medical: Not on file    Non-medical: Not on file  Tobacco Use   Smoking status: Never Smoker   Smokeless tobacco: Never Used   Tobacco comment: smoked x 1 year at age 41  Substance and Sexual Activity   Alcohol use: Yes    Comment: occ   Drug use: Never   Sexual activity: Not on file  Lifestyle   Physical activity    Days per week: Not on file    Minutes per session: Not on file   Stress: Not on file  Relationships   Social connections    Talks on phone: Not on file  Gets together: Not on file    Attends religious service: Not on file    Active member of club or organization: Not on file    Attends meetings of clubs or organizations: Not on file    Relationship status: Not on file   Intimate partner violence    Fear of current or ex partner: Not on file    Emotionally abused: Not on file    Physically abused: Not on file    Forced sexual activity: Not on file  Other Topics Concern   Not on file  Social History  Narrative   Not on file    FAMILY HISTORY: No family history on file.  ALLERGIES:  has No Known Allergies.  MEDICATIONS:  Current Facility-Administered Medications  Medication Dose Route Frequency Provider Last Rate Last Dose   0.9 %  sodium chloride infusion  250 mL Intravenous PRN Haq, Albertine Grates T, MD       0.9 % NaCl with KCl 20 mEq/ L  infusion   Intravenous Continuous Haq, Hadley Pen, MD       aspirin EC tablet 81 mg  81 mg Oral Daily Pearlean Brownie, MD   81 mg at [id number]   folic acid (FOLVITE) tablet 1 mg  1 mg Oral Daily Pearlean Brownie, MD   1 mg at 10/24/19 [id number]   heparin injection 5,000 Units  5,000 Units Subcutaneous Q8H Pearlean Brownie, MD   5,000 Units at 10/24/19 [id number]   HYDROcodone-acetaminophen (NORCO/VICODIN) 5-325 MG per tablet 1-2 tablet  1-2 tablet Oral Q4H PRN Pearlean Brownie, MD   1 tablet at 10/24/19 [id number]   multivitamin with minerals tablet 1 tablet  1 tablet Oral Daily Pearlean Brownie, MD   1 tablet at 10/24/19 0813   ondansetron (ZOFRAN) tablet 4 mg  4 mg Oral Q6H PRN Pearlean Brownie, MD       Or   ondansetron (ZOFRAN) injection 4 mg  4 mg Intravenous Q6H PRN Pearlean Brownie, MD       sodium chloride flush (NS) 0.9 % injection 3 mL  3 mL Intravenous Q12H Haq, Hadley Pen, MD       sodium chloride flush (NS) 0.9 % injection 3 mL  3 mL Intravenous Q12H Haq, Ahmad T, MD       sodium chloride flush (NS) 0.9 % injection 3 mL  3 mL Intravenous PRN Pearlean Brownie, MD        REVIEW OF SYSTEMS:    10 Point review of Systems was done is negative except as noted above.  PHYSICAL EXAMINATION: ECOG PERFORMANCE STATUS: 1 - Symptomatic but completely ambulatory  . Vitals:   10/24/19 0223 10/24/19 0602  BP: (!) 171/110 (!) 152/97  Pulse: 68 72  Resp: 18 16  Temp: 97.9 F (36.6 C) 97.9 F (36.6 C)  SpO2: 98% 98%   Filed Weights   10/23/19 1336  Weight: 155 lb (70.3 kg)   .Body mass index is 21.62 kg/m.  GENERAL:alert, in no acute distress and comfortable SKIN: no acute  rashes, no significant lesions EYES: conjunctiva are pink and non-injected, sclera anicteric OROPHARYNX: MMM, no exudates, no oropharyngeal erythema or ulceration NECK: supple, no JVD LYMPH:  no palpable lymphadenopathy in the cervical, axillary or inguinal regions LUNGS: clear to auscultation b/l with normal respiratory effort HEART: regular rate & rhythm ABDOMEN:  normoactive bowel sounds , non tender, not distended. Extremity: no pedal edema PSYCH: alert & oriented x 3 with fluent  speech NEURO: no focal motor/sensory deficits  LABORATORY DATA:  I have reviewed the data as listed  . CBC Latest Ref Rng & Units 10/23/2019  WBC 4.0 - 10.5 K/uL 16.1(H)  Hemoglobin 13.0 - 17.0 g/dL 14.7  Hematocrit 39.0 - 52.0 % 51.7  Platelets 150 - 400 K/uL 1,005(HH)    . CMP Latest Ref Rng & Units 10/24/2019 10/23/2019  Glucose 70 - 99 mg/dL - 99  BUN 8 - 23 mg/dL - 28(H)  Creatinine 0.61 - 1.24 mg/dL 1.42(H) 1.51(H)  Sodium 135 - 145 mmol/L - 136  Potassium 3.5 - 5.1 mmol/L - 4.2  Chloride 98 - 111 mmol/L - 105  CO2 22 - 32 mmol/L - 23  Calcium 8.9 - 10.3 mg/dL - 9.2     RADIOGRAPHIC STUDIES: I have personally reviewed the radiological images as listed and agreed with the findings in the report. Dg Elbow Complete Left  Result Date: 10/23/2019 CLINICAL DATA:  Arm pain and swelling EXAM: LEFT ELBOW - COMPLETE 3+ VIEW COMPARISON:  None. FINDINGS: No fracture or malalignment. No significant elbow effusion. Diffuse soft tissue edema IMPRESSION: No acute osseous abnormality Electronically Signed   By: Donavan Foil M.D.   On: 10/23/2019 16:48   US Venous Img Upper Left (dvt Study)  Result Date: 10/23/2019 CLINICAL DATA:  Bruising and swelling of left arm from the biceps to the wrist for past 5 days. Evaluate for DVT. EXAM: LEFT UPPER EXTREMITY VENOUS DOPPLER ULTRASOUND TECHNIQUE: Gray-scale sonography with graded compression, as well as color Doppler and duplex ultrasound were performed to  evaluate the upper extremity deep venous system from the level of the subclavian vein and including the jugular, axillary, basilic, radial, ulnar and upper cephalic vein. Spectral Doppler was utilized to evaluate flow at rest and with distal augmentation maneuvers. COMPARISON:  None. FINDINGS: Contralateral Subclavian Vein: Respiratory phasicity is normal and symmetric with the symptomatic side. No evidence of thrombus. Normal compressibility. Internal Jugular Vein: No evidence of thrombus. Normal compressibility, respiratory phasicity and response to augmentation. Subclavian Vein: No evidence of thrombus. Normal compressibility, respiratory phasicity and response to augmentation. Axillary Vein: No evidence of thrombus. Normal compressibility, respiratory phasicity and response to augmentation. Cephalic Vein: No evidence of thrombus. Normal compressibility, respiratory phasicity and response to augmentation. Basilic Vein: No evidence of thrombus. Normal compressibility, respiratory phasicity and response to augmentation. Brachial Veins: No evidence of thrombus. Normal compressibility, respiratory phasicity and response to augmentation. Radial Veins: No evidence of thrombus. Normal compressibility, respiratory phasicity and response to augmentation. Ulnar Veins: No evidence of thrombus. Normal compressibility, respiratory phasicity and response to augmentation. Venous Reflux:  None visualized. Other Findings: There are 2 adjacent soft tissue nodules at the mid humeral aspect of the left upper arm associated with the adjacent bicipital musculature with dominant nodule measuring approximately 2.9 x 2.1 cm and smaller adjacent nodule measuring 1.3 x 1.6 x 1.1 cm. IMPRESSION: 1. No evidence of DVT within the left upper extremity. 2. Soft tissue nodules at the mid humeral aspect of the left upper arm associated with the bicipital musculature, nonspecific though could represent a bicipital muscular tear (either partial or  complete) with associated hematoma. Clinical correlation is advised. Electronically Signed   By: Sandi Mariscal M.D.   On: 10/23/2019 17:00   Ct Venogram Head  Result Date: 10/23/2019 CLINICAL DATA:  Dural venous sinus thrombosis suspected, several weeks of headaches, platelets over 1 million, concern for venous sinus thrombosis versus bleed. EXAM: CT VENOGRAM HEAD TECHNIQUE: Noncontrast head CT  was performed. Coronal and sagittal reconstructions were also submitted for evaluation. Postcontrast CT venography of the head was also performed. Coronal and sagittal reconstructions as well as MIP reconstructions were submitted for evaluation. CONTRAST:  15mL OMNIPAQUE IOHEXOL 300 MG/ML  SOLN COMPARISON:  No pertinent prior studies available for comparison. FINDINGS: There is no evidence of acute intracranial hemorrhage. No demarcated cortical infarction. No evidence of intracranial mass. No midline shift or extra-axial fluid collection. Moderate patchy hypodensity within the cerebral white matter is nonspecific, but consistent with chronic small vessel ischemic disease. Mild generalized parenchymal atrophy. There is paucity of enhancement within portions of the right sigmoid sinus concerning for possible dural venous sinus thrombosis (for instance as seen on series 13, images 13-24 and series 7, images 29-30). The superior sagittal sinus is diminutive inferiorly, although patent without without convincing evidence of dural venous sinus thrombosis. The internal cerebral veins, vein of Galen, straight sinus, bilateral transverse and left sigmoid sinuses are patent. No hyperdense vessel. No calvarial fracture or suspicious osseous lesion. Visualized orbits demonstrate no acute abnormality. Minimal ethmoid sinus mucosal thickening. No significant mastoid effusion. These results were called by telephone at the time of interpretation on 10/23/2019 at 8:14 pm to provider [facility] , who verbally acknowledged these  results. IMPRESSION: 1. Paucity of enhancement within portions of the right sigmoid sinus concerning for possible dural venous sinus thrombosis. MRI and MRV head with and without contrast is recommended for further evaluation. 2. No evidence of intracranial hemorrhage or demarcated infarct. 3. Mild generalized parenchymal atrophy and chronic small vessel ischemic disease. Electronically Signed   By: Kellie Simmering DO   On: 10/23/2019 20:15    ASSESSMENT & PLAN:   73 year old male with  #1 Severe thrombocytosis with platelets of 1005k. #2 leukocytosis/primary neutrophilia. #3 polycythemia with a hematocrit of 51.7. #4 concern for right sigmoid dural venous sinus thrombosis. #5 left arm hematoma likely related to a bicipital tendon injury. #6 RBC microcytosis likely from iron depletion from possible MPN Plan -Patient's overall presentation is highly concerning for a newly diagnosed myeloproliferative neoplasm likely polycythemia vera or essential thrombocytosis. No acute inflammatory process or infection to suggest a reactive picture though his hematoma could cause some slight reactive thrombocytosis but not usually to this degree. -Polycythemia vera/essential thrombocytosis can certainly be a risk factor for venous thromboembolism and could potentially be a risk factor for his suspected right sigmoid dural venous sinus thrombosis. -Polycythemia could also potentially cause headaches and neurological deficits. -In this patient with severely high platelets of more than 1 million there would be significant risk of thrombotic events unless his platelet counts are lowered. -Also with platelet counts more than 1 million there is increased risk of bleeding due to possible acquired von Willebrand disease due to absorption of von Willebrand factor on the platelet surface making it unavailable for its function. -Would avoid excessive use of NSAIDs. -Patient will likely need therapeutic anticoagulation once  dural venous sinus thrombosis is confirmed. -I have ordered work-up for clonal studies including Jak 2 V617F mutation, JAK2 exon 12 mutation, Calreticulin mutation and BCR ABL mutation. -Would recommend aggressive IV hydration to address hypovolemia and get a true sense of the degree of polycythemia. -If after aggressive hydration the patient is still significantly polycythemic would need to consider possible therapeutic phlebotomy to target a hematocrit of 45 or below to reduce the risk of thrombosis. -While awaiting confirmation of the patient's myeloproliferative neoplasm with his clonal mutation testing would need to start the patient on  hydroxyurea to start addressing his thrombocytosis to reduce his risk of further thrombosis or bleeding. -I explained in detail all the findings and recommendations to the patient and obtained his consent for the work-up and to start hydroxyurea. -We discussed in detail the pros and cons of starting hydroxyurea, the possible diagnosis, the rational for use of hydroxyurea and possible adverse effects. -Did send out a ferritin and iron profile to evaluate for iron deficiency.  I would recommend against replacing iron since the iron deficiency is likely controlling his degree of polycythemia and giving the patient iron would worsen this. -Hematology service will continue to follow. -Appreciate excellent care by the hospitalist team  All of the patients questions were answered with apparent satisfaction. The patient knows to call the clinic with any problems, questions or concerns.  I spent 60 minutes counseling the patient face to face. The total time spent in the appointment was 80 minutes and more than 50% was on counseling and direct patient cares.    Sullivan Lone MD [place] AAHIVMS [facility] Hematology/Oncology Physician [facility]  (Office):       [phone] (Work cell):  [phone] (Fax):           [phone]  10/24/2019 1:22 PM

## 2019-10-24 NOTE — Consult Note (Signed)
Requesting Physician: Dr. Sherry Ruffing    Chief Complaint: Left arm swelling  History obtained from: Patient and Chart    HPI:                                                                                                                                       Dillon Hill is a 73 y.o. male with past medical history significant for migraine headaches, BPH, hyperlipidemia presents to [place] [facility] with large hematoma in the left arm and noted to have thrombocytosis with platelets greater than 1000.  The patient also has been complaining of a headache for the last 2 months that this daily.  He describes his headache as occurring on both side of his head as well as the top of his head, pressure-like, intensity is the most in the morning after which she takes 3 ibuprofen tabs which relieves his headache but returns in about 4 hours prompting him to take additional ibuprofen.  For the last 2 months he has been taking 9 tablets of ibuprofen daily.  Headache is associated with photophobia at times.  He also has noticed his blood pressure is elevated during his headache.  He has a history of migraines throughout his adult life, however frequency had reduced as he had gotten older to one episode every 2 to 3 months.  Due to headache history and thrombocytosis, CT venogram was performed at [facility] was concerning for right sigmoid sinus filling defect.    Past Medical History:  Diagnosis Date  . BPH (benign prostatic hyperplasia)   . Colon polyp   . DDD (degenerative disc disease), cervical   . High cholesterol   . Migraine     Past Surgical History:  Procedure Laterality Date  . EYE SURGERY      No family history on file. Social History:  reports that he has never smoked. He has never used smokeless tobacco. He reports current alcohol use. He reports that he does not use drugs.  Allergies: No Known Allergies  Medications:                                                                                                                         I reviewed home medications   ROS:  14 systems reviewed and negative except above    Examination:                                                                                                      General: Appears well-developed and well-nourished.  Psych: Affect appropriate to situation Eyes: No scleral injection HENT: No OP obstrucion Head: Normocephalic.  Cardiovascular: Normal rate and regular rhythm.  Respiratory: Effort normal and breath sounds normal to anterior ascultation GI: Soft.  No distension. There is no tenderness.  Skin: WDI    Neurological Examination Mental Status: Alert, oriented, thought content appropriate.  Speech fluent without evidence of aphasia. Able to follow 3 step commands without difficulty. Cranial Nerves: II: Visual fields grossly normal,  III,IV, VI: ptosis not present, extra-ocular motions intact bilaterally, pupils equal, round, reactive to light and accommodation V,VII: smile symmetric, facial light touch sensation normal bilaterally VIII: hearing normal bilaterally IX,X: uvula rises symmetrically XI: bilateral shoulder shrug XII: midline tongue extension Motor: Right : Upper extremity   5/5    Left:     Upper extremity   5/5  Lower extremity   5/5     Lower extremity   5/5 Tone and bulk:normal tone throughout; no atrophy noted Sensory: Pinprick and light touch intact throughout, bilaterally Deep Tendon Reflexes: 2+ and symmetric throughout Plantars: Right: downgoing   Left: downgoing Cerebellar: normal finger-to-nose, normal rapid alternating movements and normal heel-to-shin test Gait: normal gait and station     Lab Results: Basic Metabolic Panel: Recent Labs  Lab 10/23/19 1411  NA 136  K 4.2  CL 105  CO2 23  GLUCOSE 99  BUN 28*   CREATININE 1.51*  CALCIUM 9.2    CBC: Recent Labs  Lab 10/23/19 1411  WBC 16.1*  NEUTROABS 11.2*  HGB 14.7  HCT 51.7  MCV 68.8*  PLT 1,005*    Coagulation Studies: Recent Labs    10/23/19 1411  LABPROT 14.3  INR 1.1    Imaging: Dg Elbow Complete Left  Result Date: 10/23/2019 CLINICAL DATA:  Arm pain and swelling EXAM: LEFT ELBOW - COMPLETE 3+ VIEW COMPARISON:  None. FINDINGS: No fracture or malalignment. No significant elbow effusion. Diffuse soft tissue edema IMPRESSION: No acute osseous abnormality Electronically Signed   By: Donavan Foil M.D.   On: 10/23/2019 16:48   US Venous Img Upper Left (dvt Study)  Result Date: 10/23/2019 CLINICAL DATA:  Bruising and swelling of left arm from the biceps to the wrist for past 5 days. Evaluate for DVT. EXAM: LEFT UPPER EXTREMITY VENOUS DOPPLER ULTRASOUND TECHNIQUE: Gray-scale sonography with graded compression, as well as color Doppler and duplex ultrasound were performed to evaluate the upper extremity deep venous system from the level of the subclavian vein and including the jugular, axillary, basilic, radial, ulnar and upper cephalic vein. Spectral Doppler was utilized to evaluate flow at rest and with distal augmentation maneuvers. COMPARISON:  None. FINDINGS: Contralateral Subclavian Vein: Respiratory phasicity is normal and symmetric with the symptomatic side. No evidence of thrombus. Normal compressibility. Internal Jugular Vein: No evidence of thrombus. Normal compressibility,  respiratory phasicity and response to augmentation. Subclavian Vein: No evidence of thrombus. Normal compressibility, respiratory phasicity and response to augmentation. Axillary Vein: No evidence of thrombus. Normal compressibility, respiratory phasicity and response to augmentation. Cephalic Vein: No evidence of thrombus. Normal compressibility, respiratory phasicity and response to augmentation. Basilic Vein: No evidence of thrombus. Normal compressibility,  respiratory phasicity and response to augmentation. Brachial Veins: No evidence of thrombus. Normal compressibility, respiratory phasicity and response to augmentation. Radial Veins: No evidence of thrombus. Normal compressibility, respiratory phasicity and response to augmentation. Ulnar Veins: No evidence of thrombus. Normal compressibility, respiratory phasicity and response to augmentation. Venous Reflux:  None visualized. Other Findings: There are 2 adjacent soft tissue nodules at the mid humeral aspect of the left upper arm associated with the adjacent bicipital musculature with dominant nodule measuring approximately 2.9 x 2.1 cm and smaller adjacent nodule measuring 1.3 x 1.6 x 1.1 cm. IMPRESSION: 1. No evidence of DVT within the left upper extremity. 2. Soft tissue nodules at the mid humeral aspect of the left upper arm associated with the bicipital musculature, nonspecific though could represent a bicipital muscular tear (either partial or complete) with associated hematoma. Clinical correlation is advised. Electronically Signed   By: Sandi Mariscal M.D.   On: 10/23/2019 17:00   Ct Venogram Head  Result Date: 10/23/2019 CLINICAL DATA:  Dural venous sinus thrombosis suspected, several weeks of headaches, platelets over 1 million, concern for venous sinus thrombosis versus bleed. EXAM: CT VENOGRAM HEAD TECHNIQUE: Noncontrast head CT was performed. Coronal and sagittal reconstructions were also submitted for evaluation. Postcontrast CT venography of the head was also performed. Coronal and sagittal reconstructions as well as MIP reconstructions were submitted for evaluation. CONTRAST:  65mL OMNIPAQUE IOHEXOL 300 MG/ML  SOLN COMPARISON:  No pertinent prior studies available for comparison. FINDINGS: There is no evidence of acute intracranial hemorrhage. No demarcated cortical infarction. No evidence of intracranial mass. No midline shift or extra-axial fluid collection. Moderate patchy hypodensity within the  cerebral white matter is nonspecific, but consistent with chronic small vessel ischemic disease. Mild generalized parenchymal atrophy. There is paucity of enhancement within portions of the right sigmoid sinus concerning for possible dural venous sinus thrombosis (for instance as seen on series 13, images 13-24 and series 7, images 29-30). The superior sagittal sinus is diminutive inferiorly, although patent without without convincing evidence of dural venous sinus thrombosis. The internal cerebral veins, vein of Galen, straight sinus, bilateral transverse and left sigmoid sinuses are patent. No hyperdense vessel. No calvarial fracture or suspicious osseous lesion. Visualized orbits demonstrate no acute abnormality. Minimal ethmoid sinus mucosal thickening. No significant mastoid effusion. These results were called by telephone at the time of interpretation on 10/23/2019 at 8:14 pm to provider [facility] , who verbally acknowledged these results. IMPRESSION: 1. Paucity of enhancement within portions of the right sigmoid sinus concerning for possible dural venous sinus thrombosis. MRI and MRV head with and without contrast is recommended for further evaluation. 2. No evidence of intracranial hemorrhage or demarcated infarct. 3. Mild generalized parenchymal atrophy and chronic small vessel ischemic disease. Electronically Signed   By: Kellie Simmering DO   On: 10/23/2019 20:15     I have reviewed the above imaging: CT head    ASSESSMENT AND PLAN  73 y.o. male with past medical history significant for migraine headaches, BPH, hyperlipidemia presents to [place] [facility] with large hematoma in the left arm and noted to have thrombocytosis with platelets greater than 1000. Neurology consulted  for history of headache as well as concerning CT findings.  Description of headache as well as waxing and waning symptoms favors migraine double medication overuse headache given his excessive ibuprofen  use. However MRI brain and MR venogram pending which would confirm whether patient has sinus venous thrombosis. There is no need to emergently anticoagulate the patient as CT head does not show any significant edema, stroke or hemorrhage.   Medication overuse headache versus migraine Possible sinus venous thrombosis, pending MR venogram   Recommendations MRI brain and MR venogram Limit ibuprofen to less than 4 times per week Consider nortriptyline for headache prophylaxis if MR venogram is negative  We will continue to follow and make further recommendations  Sushanth Aroor [facility] Pager Number [phone]

## 2019-10-24 NOTE — Progress Notes (Signed)
ANTICOAGULATION CONSULT NOTE - Initial Consult  Pharmacy Consult for heparin Indication: Dural venous sinus thrombosis  No Known Allergies  Patient Measurements: Height: 5\' 11"  (180.3 cm) Weight: 155 lb (70.3 kg) IBW/kg (Calculated) : 75.3 Heparin Dosing Weight = n/a. Use TBW since TBW < IBW. TBW = 70 kg  Vital Signs: Temp: 97.8 F (36.6 C) (12/05 1731) Temp Source: Oral (12/05 1731) BP: 158/97 (12/05 1731) Pulse Rate: 74 (12/05 1731)  Labs: Recent Labs    10/23/19 1411 10/24/19 0756  HGB 14.7 15.0  HCT 51.7 53.8*  PLT 1,005* 917*  LABPROT 14.3  --   INR 1.1  --   CREATININE 1.51* 1.42*    Estimated Creatinine Clearance: 46.8 mL/min (A) (by C-G formula based on SCr of 1.42 mg/dL (H)).   Medical History: Past Medical History:  Diagnosis Date  . BPH (benign prostatic hyperplasia)   . Colon polyp   . DDD (degenerative disc disease), cervical   . High cholesterol   . Migraine     Assessment: Pharmacy consulted to dose/monitor heparin infusion in this 67 yoM diagnosed with dural venous sinus thrombosis. Pt was not on anticoagulants PTA, has been on subcutaneous heparin for DVT ppx while inpatient. Pt found to have thrombocytopenia on admission, neurology and oncology following.   Discussed with MD - will initiate heparin infusion with NO boluses and target lower end of therapeutic range due to bleeding risk.  Today, 10/24/19  Hgb 15 - WNL  Plt 917, significantly elevated  Last dose of subcutaneous heparin given 12/5 @ 1506. Has been discontinued.  SCr 1.42, CrCl 46 mL/min  Goal of Therapy:  Heparin level 0.3-0.5 units/ml Monitor platelets by anticoagulation protocol: Yes   Plan:   No heparin bolus  Initiate heparin infusion at 850 units/hr  Check HL in 8 hours  CBC and HL daily while on heparin infusion  Monitor closely for any signs/symptoms of bleeding  Follow along for transition to PO anticoagulation.   Lenis Noon,  PharmD 10/24/2019,7:24 PM

## 2019-10-24 NOTE — Progress Notes (Signed)
CRITICAL VALUE ALERT  Critical Value:  Platelets, 917  Date & Time Notied:  10/24/2019; report given from lab to Geoffry Paradise at 1415  Provider Notified: Karleen Hampshire, MD at 1450 via text page  Orders Received/Actions taken: N/A

## 2019-10-24 NOTE — H&P (Signed)
History and Physical    Dillon Hill [id number] DOB: Jan 25, 1946 DOA: 10/23/2019  PCP:  I have personally briefly reviewed patient's medical records from the emergency room.  Chief Complaint: Left arm hematoma  HPI: Dillon Hill is a 73 y.o. male with medical history significant of for migraine headaches benign prostatic hypertrophy and hyperlipidemia who presented to the [facility] in [facility] with large hematoma to left arm.  He was noted to have thrombocytopenia.  Other concerns will be noted.  He has been transferred for further work-up here ED Course: In the emergency room patient had work-up including including which was concerning for possible dural venous sinus thrombosis and MRI with and without contrast and MRV to evaluate was slated to be done at [facility] long.. Patient had no significant symptoms of any kind.  Review of Systems: As per HPI otherwise 10 point review of systems negative.  He does not have any significant pain in the site of the hematoma.  He denies any shortness of breath.  He has not had any chest pain.  He denies any abdominal pain.  Has not had any nausea or vomiting.  He has not noted any blood in the stool or the urine.  He has not had any neurologic symptoms stigmata such as headache dizziness or focal weakness.  Urinary complaints have been noted  Past Medical History:  Diagnosis Date   BPH (benign prostatic hyperplasia)    Colon polyp    DDD (degenerative disc disease), cervical    High cholesterol    Migraine     Past Surgical History:  Procedure Laterality Date   EYE SURGERY       reports that he has never smoked. He has never used smokeless tobacco. He reports current alcohol use. He reports that he does not use drugs.  No Known Allergies  No family history on file.  Prior to Admission medications   Medication Sig Start Date End Date Taking? Authorizing Provider  hydroxypropyl methylcellulose / hypromellose (ISOPTO TEARS /  GONIOVISC) 2.5 % ophthalmic solution Place 1 drop into both eyes 3 (three) times daily as needed for dry eyes.   Yes [provider]  ibuprofen (ADVIL) 200 MG tablet Take 600 mg by mouth every 6 (six) hours as needed for moderate pain.   Yes [provider]  naproxen sodium (ALEVE) 220 MG tablet Take 220 mg by mouth 2 (two) times daily as needed (pain).   Yes [provider]  SUMAtriptan (IMITREX) 100 MG tablet Take 100 mg by mouth as needed for migraine or headache.  10/21/15  Yes [provider]  cephALEXin (KEFLEX) 500 MG capsule Take 1 capsule (500 mg total) by mouth 4 (four) times daily. 11/14/15   Lajean Saver, MD  HYDROcodone-acetaminophen (NORCO/VICODIN) 5-325 MG tablet Take 1-2 tablets by mouth every 6 (six) hours as needed for moderate pain. Patient not taking: Reported on 10/24/2019 11/14/15   Lajean Saver, MD  topiramate (TOPAMAX) 50 MG tablet Take 50 mg by mouth daily. 11/11/15   [provider]    Physical Exam: Vitals:   10/23/19 2345 10/24/19 0015 10/24/19 0223 10/24/19 0602  BP:   (!) 171/110 (!) 152/97  Pulse: 69 66 68 72  Resp: 13 13 18 16   Temp:   97.9 F (36.6 C) 97.9 F (36.6 C)  TempSrc:   Oral Oral  SpO2: 98% 97% 98% 98%  Weight:      Height:        Constitutional: NAD, calm,  comfortable.  Resting comfortably Vitals:   10/23/19 2345 10/24/19 0015 10/24/19 0223 10/24/19 0602  BP:   (!) 171/110 (!) 152/97  Pulse: 69 66 68 72  Resp: 13 13 18 16   Temp:   97.9 F (36.6 C) 97.9 F (36.6 C)  TempSrc:   Oral Oral  SpO2: 98% 97% 98% 98%  Weight:      Height:      Blood pressure has starting to come down. Eye examination is unremarkable, Throat shows no erythema or exudate Neck is supple no JVD is noted. Chest is symmetric, Lungs are clear with diminished breath sounds in lower lung fields. Abdomen is nontender bowel sounds are present. Hematoma in the medial left arm noted. Skin turgor is otherwise  normal. Grossly nonfocal neurologic examination. Mood is normal he does not appear to be depressed Extremities reveal no significant edema.  No calf tenderness is noted Pedal pulses are diminished bilateral.   (Labs on Admission: I have personally reviewed following labs and imaging studies: White count 16.1 with absolute neutrophil count at 11.2 MCV is 68.8 platelet count is 100,5000 His BUN is 28 and creatinine is 1.51 with estimated GFR of 44 Recent Labs  Lab 10/23/19 1411  WBC 16.1*  NEUTROABS 11.2*  HGB 14.7  HCT 51.7  MCV 68.8*  PLT 0000000*   Basic Metabolic Panel: Recent Labs  Lab 10/23/19 1411  NA 136  K 4.2  CL 105  CO2 23  GLUCOSE 99  BUN 28*  CREATININE 1.51*  CALCIUM 9.2   GFR: Estimated Creatinine Clearance: 44 mL/min (A) (by C-G formula based on SCr of 1.51 mg/dL (H)). Liver Function Tests: No results for input(s): AST, ALT, ALKPHOS, BILITOT, PROT, ALBUMIN in the last 168 hours. No results for input(s): LIPASE, AMYLASE in the last 168 hours. No results for input(s): AMMONIA in the last 168 hours. Coagulation Profile: Recent Labs  Lab 10/23/19 1411  INR 1.1   Cardiac Enzymes: No results for input(s): CKTOTAL, CKMB, CKMBINDEX, TROPONINI in the last 168 hours. BNP (last 3 results) No results for input(s): PROBNP in the last 8760 hours. HbA1C: No results for input(s): HGBA1C in the last 72 hours. CBG: No results for input(s): GLUCAP in the last 168 hours. Lipid Profile: No results for input(s): CHOL, HDL, LDLCALC, TRIG, CHOLHDL, LDLDIRECT in the last 72 hours. Thyroid Function Tests: No results for input(s): TSH, T4TOTAL, FREET4, T3FREE, THYROIDAB in the last 72 hours. Anemia Panel: No results for input(s): VITAMINB12, FOLATE, FERRITIN, TIBC, IRON, RETICCTPCT in the last 72 hours. Urine analysis:    Component Value Date/Time   COLORURINE AMBER (A) 11/14/2015 1218   APPEARANCEUR CLEAR 11/14/2015 1218   LABSPEC 1.035 (H) 11/14/2015 1218    PHURINE 6.0 11/14/2015 [place] 11/14/2015 1218   HGBUR NEGATIVE 11/14/2015 1218   BILIRUBINUR SMALL (A) 11/14/2015 1218   KETONESUR 15 (A) 11/14/2015 1218   PROTEINUR 30 (A) 11/14/2015 1218   NITRITE NEGATIVE 11/14/2015 1218   LEUKOCYTESUR NEGATIVE 11/14/2015 1218    Radiological Exams on Admission: Dg Elbow Complete Left  Result Date: 10/23/2019 CLINICAL DATA:  Arm pain and swelling EXAM: LEFT ELBOW - COMPLETE 3+ VIEW COMPARISON:  None. FINDINGS: No fracture or malalignment. No significant elbow effusion. Diffuse soft tissue edema IMPRESSION: No acute osseous abnormality Electronically Signed   By: Donavan Foil M.D.   On: 10/23/2019 16:48   US Venous Img Upper Left (dvt Study)  Result Date: 10/23/2019 CLINICAL DATA:  Bruising and swelling of left  arm from the biceps to the wrist for past 5 days. Evaluate for DVT. EXAM: LEFT UPPER EXTREMITY VENOUS DOPPLER ULTRASOUND TECHNIQUE: Gray-scale sonography with graded compression, as well as color Doppler and duplex ultrasound were performed to evaluate the upper extremity deep venous system from the level of the subclavian vein and including the jugular, axillary, basilic, radial, ulnar and upper cephalic vein. Spectral Doppler was utilized to evaluate flow at rest and with distal augmentation maneuvers. COMPARISON:  None. FINDINGS: Contralateral Subclavian Vein: Respiratory phasicity is normal and symmetric with the symptomatic side. No evidence of thrombus. Normal compressibility. Internal Jugular Vein: No evidence of thrombus. Normal compressibility, respiratory phasicity and response to augmentation. Subclavian Vein: No evidence of thrombus. Normal compressibility, respiratory phasicity and response to augmentation. Axillary Vein: No evidence of thrombus. Normal compressibility, respiratory phasicity and response to augmentation. Cephalic Vein: No evidence of thrombus. Normal compressibility, respiratory phasicity and response to  augmentation. Basilic Vein: No evidence of thrombus. Normal compressibility, respiratory phasicity and response to augmentation. Brachial Veins: No evidence of thrombus. Normal compressibility, respiratory phasicity and response to augmentation. Radial Veins: No evidence of thrombus. Normal compressibility, respiratory phasicity and response to augmentation. Ulnar Veins: No evidence of thrombus. Normal compressibility, respiratory phasicity and response to augmentation. Venous Reflux:  None visualized. Other Findings: There are 2 adjacent soft tissue nodules at the mid humeral aspect of the left upper arm associated with the adjacent bicipital musculature with dominant nodule measuring approximately 2.9 x 2.1 cm and smaller adjacent nodule measuring 1.3 x 1.6 x 1.1 cm. IMPRESSION: 1. No evidence of DVT within the left upper extremity. 2. Soft tissue nodules at the mid humeral aspect of the left upper arm associated with the bicipital musculature, nonspecific though could represent a bicipital muscular tear (either partial or complete) with associated hematoma. Clinical correlation is advised. Electronically Signed   By: Sandi Mariscal M.D.   On: 10/23/2019 17:00   Ct Venogram Head  Result Date: 10/23/2019 CLINICAL DATA:  Dural venous sinus thrombosis suspected, several weeks of headaches, platelets over 1 million, concern for venous sinus thrombosis versus bleed. EXAM: CT VENOGRAM HEAD TECHNIQUE: Noncontrast head CT was performed. Coronal and sagittal reconstructions were also submitted for evaluation. Postcontrast CT venography of the head was also performed. Coronal and sagittal reconstructions as well as MIP reconstructions were submitted for evaluation. CONTRAST:  17mL OMNIPAQUE IOHEXOL 300 MG/ML  SOLN COMPARISON:  No pertinent prior studies available for comparison. FINDINGS: There is no evidence of acute intracranial hemorrhage. No demarcated cortical infarction. No evidence of intracranial mass. No midline  shift or extra-axial fluid collection. Moderate patchy hypodensity within the cerebral white matter is nonspecific, but consistent with chronic small vessel ischemic disease. Mild generalized parenchymal atrophy. There is paucity of enhancement within portions of the right sigmoid sinus concerning for possible dural venous sinus thrombosis (for instance as seen on series 13, images 13-24 and series 7, images 29-30). The superior sagittal sinus is diminutive inferiorly, although patent without without convincing evidence of dural venous sinus thrombosis. The internal cerebral veins, vein of Galen, straight sinus, bilateral transverse and left sigmoid sinuses are patent. No hyperdense vessel. No calvarial fracture or suspicious osseous lesion. Visualized orbits demonstrate no acute abnormality. Minimal ethmoid sinus mucosal thickening. No significant mastoid effusion. These results were called by telephone at the time of interpretation on 10/23/2019 at 8:14 pm to provider [facility] , who verbally acknowledged these results. IMPRESSION: 1. Paucity of enhancement within portions of the right sigmoid sinus concerning for  possible dural venous sinus thrombosis. MRI and MRV head with and without contrast is recommended for further evaluation. 2. No evidence of intracranial hemorrhage or demarcated infarct. 3. Mild generalized parenchymal atrophy and chronic small vessel ischemic disease. Electronically Signed   By: Kellie Simmering DO   On: 10/23/2019 20:15  No DVT noted in the arm. CT venogram showed possible dural venous sinus thrombosis RIN MRV are recommended he also has chronic small vessel disease  EKG: N/A  Assessment/Plan Active Problems:   Thrombocytosis (HCC) Hematoma of the arm. Significant microcytosis is noted without anemia.  Thrombocytosis could be in response to the microcytosis however it will need to be elucidated further Plan: Gentle hydration.  Supportive treatment. Hematology  consultation. Daughter and an MR I and MRV of the head to elucidate the extent of venous sinus thrombosis further  DVT prophylaxis: Contraindicated at this time Code Status: Full Family Communication: No family at bedside Disposition Plan: Home Consults called: Neurology has been consulted Admission status: Full   Pearlean Brownie MD [facility] Pager [phone]  If 7PM-7AM, please contact night-coverage [email or web address] Password [facility]  10/24/2019, 6:26 AM

## 2019-10-24 NOTE — Plan of Care (Addendum)
MRV positive for dural venous sinus thrombosis right sigmoid sinus. No acute stroke on MRI.  No cerebral edema.  Recommendations: -Full dose Lovenox. -Transition to oral Coumadin. -Appreciate hematology/oncology recommendations-please follow-up with the hematology oncology given the new confirmed findings of the dural venous sinus thrombosis. -Outpatient neurology follow-up in 4 to 6 weeks after discharge  Neurology service will be available as needed.  Plan relayed to primary hospitalist Dr. Karleen Hampshire  -- Amie Portland, MD [facility] Pager: [phone] If 7pm to 7am, please call on call as listed on AMION.

## 2019-10-24 NOTE — Progress Notes (Signed)
PROGRESS NOTE    Dillon Hill  JTT:[id number] DOB: [birth year] DOA: 10/23/2019 PCP: [name], MD    Brief Narrative: 73 year old gentleman with prior history of migraine headaches, BPH, degenerative disc disease, hyperlipidemia, presents to ED with left arm hematoma and migraine headaches..  On arrival to ED he was found to have thrombocytosis with platelet count in 1000000, polycythemia.  CT venogram of the head Paucity of enhancement within portions of the right sigmoid sinus concerning for possible dural venous sinus thrombosis. MRI and MRV head with and without contrast is recommended for further evaluation.  Neurology was consulted who recommended getting MRI of the head with and without contrast and MRV of the head.  Meanwhile oncology consulted for evaluation of thrombocytosis.  Patient seen and examined today and he reports his migraine headache is around 3-4 out of 10.  Assessment & Plan:   Active Problems:   Thrombocytosis (HCC)   Thrombocythemia ([place])   Acute migraine headaches Going on for few months now.  He reports his pain is better today.  Differential include right sigmoid dural venous sinus thrombosis.  Neurology consulted and recommended getting an MRI and MRV of the head with and without contrast.    Severe thrombocytosis with platelet counts greater than 1000k Oncology consulted and appreciate recommendations.  Patient's differential diagnosis include essential thrombocytosis versus polycythemia vera. Jak 2 V617F mutation, JAK2 exon 12 mutation, Calreticulin mutation and BCR ABL mutation ordered by oncology. Further recommendations to follow depending on the MRI and MRV of the head and the gene studies.  Meanwhile patient was started on hydroxyurea and aggressive hydration.   Stage III CKD Unclear what his baseline creatinine is.  Get urine analysis and ultrasound kidneys. Hydrate and repeat renal parameters in the morning.   DVT prophylaxis:  (Heparin Code Status: Full code Family Communication: None at bedside Disposition Plan: Pending clinical improvement and further evaluation of thrombocytosis and venous thrombosis   Consultants:   Oncology  Neurology  Procedures: (MRI of the head without and with contrast MRV of the head    Antimicrobials: None  Subjective: Headache better.  Patient denies any nausea vomiting or abdominal pain patient denies any dizziness or blurry vision.  No chest pain or shortness of breath  Objective: Vitals:   10/23/19 2345 10/24/19 0015 10/24/19 0223 10/24/19 0602  BP:   (!) 171/110 (!) 152/97  Pulse: 69 66 68 72  Resp: _0 Temp:   97.9 F (36.6 C) 97.9 F (36.6 C)  TempSrc:   Oral Oral  SpO2: 98% 97% 98% 98%  Weight:      Height:       No intake or output data in the 24 hours ending 10/24/19 1558 Filed Weights   10/23/19 1336  Weight: 70.3 kg    Examination:  General exam: Appears calm and comfortable  Respiratory system: Clear to auscultation. Respiratory effort normal. Cardiovascular system: S1 & S2 heard, RRR.[name] No pedal edema. Gastrointestinal system: Abdomen is nondistended, soft and nontender. No organomegaly or masses felt. Normal bowel sounds heard. Central nervous system: Alert and oriented. No focal neurological deficits. Extremities: Left upper extremity swelling and tenderness present Skin: No rashes, lesions or ulcers Psychiatry:  Mood & affect appropriate.     Data Reviewed: I have personally reviewed following labs and imaging studies  CBC: Recent Labs  Lab 10/23/19 1411 10/24/19 0756  WBC 16.1* 16.7*  NEUTROABS 11.2*  --   HGB 14.7 15.0  HCT 51.7 53.8*  MCV 68.8*  69.0*  PLT 1,005* 852*   Basic Metabolic Panel: Recent Labs  Lab 10/23/19 1411 10/24/19 0756  NA 136  --   K 4.2  --   CL 105  --   CO2 23  --   GLUCOSE 99  --   BUN 28*  --   CREATININE 1.51* 1.42*  CALCIUM 9.2  --    GFR: Estimated Creatinine Clearance: 46.8  mL/min (A) (by C-G formula based on SCr of 1.42 mg/dL (H)). Liver Function Tests: No results for input(s): AST, ALT, ALKPHOS, BILITOT, PROT, ALBUMIN in the last 168 hours. No results for input(s): LIPASE, AMYLASE in the last 168 hours. No results for input(s): AMMONIA in the last 168 hours. Coagulation Profile: Recent Labs  Lab 10/23/19 1411  INR 1.1   Cardiac Enzymes: No results for input(s): CKTOTAL, CKMB, CKMBINDEX, TROPONINI in the last 168 hours. BNP (last 3 results) No results for input(s): PROBNP in the last 8760 hours. HbA1C: No results for input(s): HGBA1C in the last 72 hours. CBG: No results for input(s): GLUCAP in the last 168 hours. Lipid Profile: No results for input(s): CHOL, HDL, LDLCALC, TRIG, CHOLHDL, LDLDIRECT in the last 72 hours. Thyroid Function Tests: No results for input(s): TSH, T4TOTAL, FREET4, T3FREE, THYROIDAB in the last 72 hours. Anemia Panel: Recent Labs    10/24/19 1417  FERRITIN 31  TIBC 426  IRON 35*   Sepsis Labs: No results for input(s): PROCALCITON, LATICACIDVEN in the last 168 hours.  Recent Results (from the past 240 hour(s))  SARS Coronavirus 2 Ag (30 min TAT) - Nasal Swab (BD Veritor Kit)     Status: None   Collection Time: 10/23/19  8:22 PM   Specimen: Nasal Swab (BD Veritor Kit)  Result Value Ref Range Status   SARS Coronavirus 2 Ag NEGATIVE NEGATIVE Final    Comment: (NOTE) SARS-CoV-2 antigen NOT DETECTED.  Negative results are presumptive.  Negative results do not preclude SARS-CoV-2 infection and should not be used as the sole basis for treatment or other patient management decisions, including infection  control decisions, particularly in the presence of clinical signs and  symptoms consistent with COVID-19, or in those who have been in contact with the virus.  Negative results must be combined with clinical observations, patient history, and epidemiological information. The expected result is Negative. Fact Sheet for  Patients: PodPark.tn Fact Sheet for Healthcare Providers: GiftContent.is This test is not yet approved or cleared by the [place] FDA and  has been authorized for detection and/or diagnosis of SARS-CoV-2 by FDA under an Emergency Use Authorization (EUA).  This EUA will remain in effect (meaning this test can be used) for the duration of  the COVID-19 de claration under Section 564(b)(1) of the Act, 21 U.S.C. section 360bbb-3(b)(1), unless the authorization is terminated or revoked sooner. Performed at [facility], [address] [place], [place] [address]   SARS CORONAVIRUS 2 (TAT 6-24 HRS) Nasopharyngeal Nasopharyngeal Swab     Status: None   Collection Time: 10/23/19  9:11 PM   Specimen: Nasopharyngeal Swab  Result Value Ref Range Status   SARS Coronavirus 2 NEGATIVE NEGATIVE Final    Comment: (NOTE) SARS-CoV-2 target nucleic acids are NOT DETECTED. The SARS-CoV-2 RNA is generally detectable in upper and lower respiratory specimens during the acute phase of infection. Negative results do not preclude SARS-CoV-2 infection, do not rule out co-infections with other pathogens, and should not be used as the sole basis for treatment or other patient  management decisions. Negative results must be combined with clinical observations, patient history, and epidemiological information. The expected result is Negative. Fact Sheet for Patients: SugarRoll.be Fact Sheet for Healthcare Providers: https://www.woods-mathews.com/ This test is not yet approved or cleared by the [place] FDA and  has been authorized for detection and/or diagnosis of SARS-CoV-2 by FDA under an Emergency Use Authorization (EUA). This EUA will remain  in effect (meaning this test can be used) for the duration of the COVID-19 declaration under Section 56 4(b)(1) of the Act, 21 U.S.C. section  360bbb-3(b)(1), unless the authorization is terminated or revoked sooner. Performed at [facility], [place] [address], [place], [place] [address]          Radiology Studies: Dg Elbow Complete Left  Result Date: 10/23/2019 CLINICAL DATA:  Arm pain and swelling EXAM: LEFT ELBOW - COMPLETE 3+ VIEW COMPARISON:  None. FINDINGS: No fracture or malalignment. No significant elbow effusion. Diffuse soft tissue edema IMPRESSION: No acute osseous abnormality Electronically Signed   By: Donavan Foil M.D.   On: 10/23/2019 16:48   US Venous Img Upper Left (dvt Study)  Result Date: 10/23/2019 CLINICAL DATA:  Bruising and swelling of left arm from the biceps to the wrist for past 5 days. Evaluate for DVT. EXAM: LEFT UPPER EXTREMITY VENOUS DOPPLER ULTRASOUND TECHNIQUE: Gray-scale sonography with graded compression, as well as color Doppler and duplex ultrasound were performed to evaluate the upper extremity deep venous system from the level of the subclavian vein and including the jugular, axillary, basilic, radial, ulnar and upper cephalic vein. Spectral Doppler was utilized to evaluate flow at rest and with distal augmentation maneuvers. COMPARISON:  None. FINDINGS: Contralateral Subclavian Vein: Respiratory phasicity is normal and symmetric with the symptomatic side. No evidence of thrombus. Normal compressibility. Internal Jugular Vein: No evidence of thrombus. Normal compressibility, respiratory phasicity and response to augmentation. Subclavian Vein: No evidence of thrombus. Normal compressibility, respiratory phasicity and response to augmentation. Axillary Vein: No evidence of thrombus. Normal compressibility, respiratory phasicity and response to augmentation. Cephalic Vein: No evidence of thrombus. Normal compressibility, respiratory phasicity and response to augmentation. Basilic Vein: No evidence of thrombus. Normal compressibility, respiratory phasicity and response to augmentation. Brachial  Veins: No evidence of thrombus. Normal compressibility, respiratory phasicity and response to augmentation. Radial Veins: No evidence of thrombus. Normal compressibility, respiratory phasicity and response to augmentation. Ulnar Veins: No evidence of thrombus. Normal compressibility, respiratory phasicity and response to augmentation. Venous Reflux:  None visualized. Other Findings: There are 2 adjacent soft tissue nodules at the mid humeral aspect of the left upper arm associated with the adjacent bicipital musculature with dominant nodule measuring approximately 2.9 x 2.1 cm and smaller adjacent nodule measuring 1.3 x 1.6 x 1.1 cm. IMPRESSION: 1. No evidence of DVT within the left upper extremity. 2. Soft tissue nodules at the mid humeral aspect of the left upper arm associated with the bicipital musculature, nonspecific though could represent a bicipital muscular tear (either partial or complete) with associated hematoma. Clinical correlation is advised. Electronically Signed   By: Sandi Mariscal M.D.   On: 10/23/2019 17:00   Ct Venogram Head  Result Date: 10/23/2019 CLINICAL DATA:  Dural venous sinus thrombosis suspected, several weeks of headaches, platelets over 1 million, concern for venous sinus thrombosis versus bleed. EXAM: CT VENOGRAM HEAD TECHNIQUE: Noncontrast head CT was performed. Coronal and sagittal reconstructions were also submitted for evaluation. Postcontrast CT venography of the head was also performed. Coronal and sagittal reconstructions as well as  MIP reconstructions were submitted for evaluation. CONTRAST:  46m OMNIPAQUE IOHEXOL 300 MG/ML  SOLN COMPARISON:  No pertinent prior studies available for comparison. FINDINGS: There is no evidence of acute intracranial hemorrhage. No demarcated cortical infarction. No evidence of intracranial mass. No midline shift or extra-axial fluid collection. Moderate patchy hypodensity within the cerebral white matter is nonspecific, but consistent with  chronic small vessel ischemic disease. Mild generalized parenchymal atrophy. There is paucity of enhancement within portions of the right sigmoid sinus concerning for possible dural venous sinus thrombosis (for instance as seen on series 13, images 13-24 and series 7, images 29-30). The superior sagittal sinus is diminutive inferiorly, although patent without without convincing evidence of dural venous sinus thrombosis. The internal cerebral veins, vein of [place], straight sinus, bilateral transverse and left sigmoid sinuses are patent. No hyperdense vessel. No calvarial fracture or suspicious osseous lesion. Visualized orbits demonstrate no acute abnormality. Minimal ethmoid sinus mucosal thickening. No significant mastoid effusion. These results were called by telephone at the time of interpretation on 10/23/2019 at 8:14 pm [facility] who verbally acknowledged these results. IMPRESSION: 1. Paucity of enhancement within portions of the right sigmoid sinus concerning for possible dural venous sinus thrombosis. MRI and MRV head with and without contrast is recommended for further evaluation. 2. No evidence of intracranial hemorrhage or demarcated infarct. 3. Mild generalized parenchymal atrophy and chronic small vessel ischemic disease. Electronically Signed   By: KKellie SimmeringDO   On: 10/23/2019 20:15        Scheduled Meds:  aspirin EC  81 mg Oral Daily   folic acid  1 mg Oral Daily   heparin  5,000 Units Subcutaneous Q8H   hydroxyurea  500 mg Oral BID WC   multivitamin with minerals  1 tablet Oral Daily   sodium chloride flush  3 mL Intravenous Q12H   sodium chloride flush  3 mL Intravenous Q12H   Continuous Infusions:  sodium chloride     0.9 % NaCl with KCl 20 mEq / L 75 mL/hr at 10/24/19 1510     LOS: 1 day        VHosie Poisson MD [facility] 10/24/2019, 3:58 PM

## 2019-10-25 ENCOUNTER — Inpatient Hospital Stay (HOSPITAL_COMMUNITY): Payer: Medicare Other

## 2019-10-25 ENCOUNTER — Encounter (HOSPITAL_COMMUNITY): Payer: Self-pay | Admitting: Hematology

## 2019-10-25 DIAGNOSIS — D473 Essential (hemorrhagic) thrombocythemia: Secondary | ICD-10-CM

## 2019-10-25 DIAGNOSIS — D471 Chronic myeloproliferative disease: Secondary | ICD-10-CM

## 2019-10-25 DIAGNOSIS — D45 Polycythemia vera: Secondary | ICD-10-CM

## 2019-10-25 DIAGNOSIS — G08 Intracranial and intraspinal phlebitis and thrombophlebitis: Principal | ICD-10-CM

## 2019-10-25 LAB — CBC WITH DIFFERENTIAL/PLATELET
Abs Immature Granulocytes: 0.34 10*3/uL — ABNORMAL HIGH (ref 0.00–0.07)
Basophils Absolute: 0.7 10*3/uL — ABNORMAL HIGH (ref 0.0–0.1)
Basophils Relative: 4 %
Eosinophils Absolute: 1.1 10*3/uL — ABNORMAL HIGH (ref 0.0–0.5)
Eosinophils Relative: 7 %
HCT: 51.9 % (ref 39.0–52.0)
Hemoglobin: 14.3 g/dL (ref 13.0–17.0)
Immature Granulocytes: 2 %
Lymphocytes Relative: 11 %
Lymphs Abs: 1.9 10*3/uL (ref 0.7–4.0)
MCH: 19.2 pg — ABNORMAL LOW (ref 26.0–34.0)
MCHC: 27.6 g/dL — ABNORMAL LOW (ref 30.0–36.0)
MCV: 69.7 fL — ABNORMAL LOW (ref 80.0–100.0)
Monocytes Absolute: 1.6 10*3/uL — ABNORMAL HIGH (ref 0.1–1.0)
Monocytes Relative: 10 %
Neutro Abs: 11 10*3/uL — ABNORMAL HIGH (ref 1.7–7.7)
Neutrophils Relative %: 66 %
Platelets: 865 10*3/uL — ABNORMAL HIGH (ref 150–400)
RBC: 7.45 MIL/uL — ABNORMAL HIGH (ref 4.22–5.81)
RDW: 26.7 % — ABNORMAL HIGH (ref 11.5–15.5)
WBC: 16.7 10*3/uL — ABNORMAL HIGH (ref 4.0–10.5)
nRBC: 1 % — ABNORMAL HIGH (ref 0.0–0.2)

## 2019-10-25 LAB — COMPREHENSIVE METABOLIC PANEL
ALT: 13 U/L (ref 0–44)
AST: 27 U/L (ref 15–41)
Albumin: 3.2 g/dL — ABNORMAL LOW (ref 3.5–5.0)
Alkaline Phosphatase: 112 U/L (ref 38–126)
Anion gap: 10 (ref 5–15)
BUN: 27 mg/dL — ABNORMAL HIGH (ref 8–23)
CO2: 24 mmol/L (ref 22–32)
Calcium: 8.9 mg/dL (ref 8.9–10.3)
Chloride: 105 mmol/L (ref 98–111)
Creatinine, Ser: 1.63 mg/dL — ABNORMAL HIGH (ref 0.61–1.24)
GFR calc Af Amer: 48 mL/min — ABNORMAL LOW (ref >60)
GFR calc non Af Amer: 41 mL/min — ABNORMAL LOW (ref >60)
Glucose, Bld: 91 mg/dL (ref 70–99)
Potassium: 4.1 mmol/L (ref 3.5–5.1)
Sodium: 139 mmol/L (ref 135–145)
Total Bilirubin: 1.2 mg/dL (ref 0.3–1.2)
Total Protein: 6 g/dL — ABNORMAL LOW (ref 6.5–8.1)

## 2019-10-25 LAB — PROTIME-INR
INR: 1.2 (ref 0.8–1.2)
Prothrombin Time: 15 seconds (ref 11.4–15.2)

## 2019-10-25 LAB — HEPARIN LEVEL (UNFRACTIONATED)
Heparin Unfractionated: 0.18 IU/mL — ABNORMAL LOW (ref 0.30–0.70)
Heparin Unfractionated: 0.17 IU/mL — ABNORMAL LOW (ref 0.30–0.70)

## 2019-10-25 MED ORDER — IBUPROFEN 200 MG PO TABS: 400.0000 mg | ORAL_TABLET | Freq: Four times a day (QID) | ORAL | Status: DC | PRN

## 2019-10-25 MED ORDER — LABETALOL HCL 5 MG/ML IV SOLN
10.0000 mg | INTRAVENOUS | Status: DC | PRN
  Filled 2019-10-25 (×2): qty 4

## 2019-10-25 MED ORDER — MORPHINE SULFATE (PF) 2 MG/ML IV SOLN
2.0000 mg | INTRAVENOUS | Status: DC | PRN
  Administered 2019-10-26: 4 mg via INTRAVENOUS
  Filled 2019-10-25: qty 2

## 2019-10-25 MED ORDER — HYDRALAZINE HCL 20 MG/ML IJ SOLN
10.0000 mg | Freq: Four times a day (QID) | INTRAMUSCULAR | Status: DC | PRN
  Administered 2019-10-25 – 2019-10-27 (×3): 10 mg via INTRAVENOUS
  Filled 2019-10-25 (×5): qty 1

## 2019-10-25 MED ORDER — CHLORHEXIDINE GLUCONATE CLOTH 2 % EX PADS
6.0000 | MEDICATED_PAD | Freq: Every day | CUTANEOUS | Status: DC
  Administered 2019-10-26 – 2019-10-27 (×2): 6 via TOPICAL

## 2019-10-25 MED ORDER — SUMATRIPTAN SUCCINATE 50 MG PO TABS
100.0000 mg | ORAL_TABLET | ORAL | Status: DC | PRN
  Administered 2019-10-25 – 2019-10-27 (×4): 100 mg via ORAL
  Filled 2019-10-25 (×9): qty 2

## 2019-10-25 MED ORDER — HYDRALAZINE HCL 20 MG/ML IJ SOLN
10.0000 mg | Freq: Once | INTRAMUSCULAR | Status: AC
  Administered 2019-10-25: 10 mg via INTRAVENOUS
  Filled 2019-10-25: qty 1

## 2019-10-25 MED ORDER — MORPHINE SULFATE (PF) 2 MG/ML IV SOLN
1.0000 mg | INTRAVENOUS | Status: DC | PRN
  Administered 2019-10-25: 1 mg via INTRAVENOUS
  Administered 2019-10-25: 2 mg via INTRAVENOUS
  Filled 2019-10-25 (×2): qty 1

## 2019-10-25 MED ORDER — HYDRALAZINE HCL 50 MG PO TABS: 50.0000 mg | ORAL_TABLET | Freq: Four times a day (QID) | ORAL | Status: DC | PRN

## 2019-10-25 MED ORDER — HYDRALAZINE HCL 25 MG PO TABS
25.0000 mg | ORAL_TABLET | Freq: Four times a day (QID) | ORAL | Status: DC | PRN
  Administered 2019-10-25: 25 mg via ORAL
  Filled 2019-10-25: qty 1

## 2019-10-25 MED ORDER — HEPARIN (PORCINE) 25000 UT/250ML-% IV SOLN
1200.0000 [IU]/h | INTRAVENOUS | Status: AC
  Administered 2019-10-25: 23:00:00 1200 [IU]/h via INTRAVENOUS
  Filled 2019-10-25 (×2): qty 250

## 2019-10-25 MED ORDER — HEPARIN (PORCINE) 25000 UT/250ML-% IV SOLN
1000.0000 [IU]/h | INTRAVENOUS | Status: DC
  Filled 2019-10-25: qty 250

## 2019-10-25 MED ORDER — ORAL CARE MOUTH RINSE: 15.0000 mL | Freq: Two times a day (BID) | OROMUCOSAL | Status: DC

## 2019-10-25 NOTE — Progress Notes (Signed)
Patient noted to have elevated blood pressure (190/114) taken on  Right leg . Repeat BP 162/112 in right arm. Patient   complaining of a headache 10/10. Dr. Karleen Hampshire notified new orders received

## 2019-10-25 NOTE — Progress Notes (Signed)
[name]   HEMATOLOGY/ONCOLOGY INPATIENT PROGRESS NOTE  Date of Service: 10/25/2019  Inpatient Attending: .Hosie Poisson, MD   SUBJECTIVE  Patient was seen for medical oncology/hematology follow-up today.  He notes no acute new focal neurological deficits.  Still has some headaches and has been feeling a little woozy after receiving narcotic pain medications.  Notes his left arm/forearm swelling is resolving.  His MRI/MRV yesterday confirmed dural venous sinus thrombosis and the patient was started on IV heparin.  His platelets are starting to trend downwards with his hydroxyurea.  He has received IV fluids overnight but notes that he could do a better job with oral fluid intake which was encouraged.  Hematocrit still at around 52.  Given dural venous sinus thrombosis and concern for polycythemia did recommend therapeutic phlebotomy of about 1 pint of blood.  Patient has agreed to this.    OBJECTIVE:  NAD  PHYSICAL EXAMINATION: . Vitals:   10/25/19 0938 10/25/19 1140 10/25/19 1245 10/25/19 1246  BP: (!) 184/104 (!) 187/106 (!) 188/100 (!) 188/84  Pulse: 79 83    Resp:      Temp:      TempSrc:      SpO2: 99% 99%    Weight:      Height:       Filed Weights   10/23/19 1336  Weight: 155 lb (70.3 kg)   .Body mass index is 21.62 kg/m.  GENERAL:alert, in no acute distress and comfortable SKIN: skin color, texture, turgor are normal, no rashes or significant lesions EYES: normal, conjunctiva are pink and non-injected, sclera clear OROPHARYNX:no exudate, no erythema and lips, buccal mucosa, and tongue normal  NECK: supple, no JVD, thyroid normal size, non-tender, without nodularity LYMPH:  no palpable lymphadenopathy in the cervical, axillary or inguinal LUNGS: clear to auscultation with normal respiratory effort HEART: regular rate & rhythm,  no murmurs and no lower extremity edema ABDOMEN: abdomen soft, non-tender, normoactive bowel sounds  Musculoskeletal: Decreasing left upper  extremity arm and forearm swelling. PSYCH: alert & oriented x 3 with fluent speech NEURO: no focal motor/sensory deficits  MEDICAL HISTORY:  Past Medical History:  Diagnosis Date   BPH (benign prostatic hyperplasia)    Colon polyp    DDD (degenerative disc disease), cervical    High cholesterol    Migraine     SURGICAL HISTORY: Past Surgical History:  Procedure Laterality Date   EYE SURGERY      SOCIAL HISTORY: Social History   Socioeconomic History   Marital status: Married    Spouse name: Not on file   Number of children: Not on file   Years of education: Not on file   Highest education level: Not on file  Occupational History   Not on file  Social Needs   Financial resource strain: Not on file   Food insecurity    Worry: Not on file    Inability: Not on file   Transportation needs    Medical: Not on file    Non-medical: Not on file  Tobacco Use   Smoking status: Never Smoker   Smokeless tobacco: Never Used   Tobacco comment: smoked x 1 year at age 44  Substance and Sexual Activity   Alcohol use: Yes    Comment: occ   Drug use: Never   Sexual activity: Not on file  Lifestyle   Physical activity    Days per week: Not on file    Minutes per session: Not on file   Stress: Not on file  Relationships   Social Herbalist on phone: Not on file    Gets together: Not on file    Attends religious service: Not on file    Active member of club or organization: Not on file    Attends meetings of clubs or organizations: Not on file    Relationship status: Not on file   Intimate partner violence    Fear of current or ex partner: Not on file    Emotionally abused: Not on file    Physically abused: Not on file    Forced sexual activity: Not on file  Other Topics Concern   Not on file  Social History Narrative   Not on file    FAMILY HISTORY: No family history on file.  ALLERGIES:  has No Known Allergies.  MEDICATIONS:    Scheduled Meds:  aspirin EC  81 mg Oral Daily   folic acid  1 mg Oral Daily   hydroxyurea  500 mg Oral BID WC   multivitamin with minerals  1 tablet Oral Daily   sodium chloride flush  3 mL Intravenous Q12H   sodium chloride flush  3 mL Intravenous Q12H   Continuous Infusions:  sodium chloride     sodium chloride 125 mL/hr at 10/25/19 1042   heparin 1,000 Units/hr (10/25/19 0849)   PRN Meds:.sodium chloride, hydrALAZINE, HYDROcodone-acetaminophen, ondansetron **OR** ondansetron (ZOFRAN) IV, sodium chloride flush  REVIEW OF SYSTEMS:    10 Point review of Systems was done is negative except as noted above.   LABORATORY DATA:  I have reviewed the data as listed  . CBC Latest Ref Rng & Units 10/25/2019 10/24/2019 10/23/2019  WBC 4.0 - 10.5 K/uL 16.7(H) 16.7(H) 16.1(H)  Hemoglobin 13.0 - 17.0 g/dL 14.3 15.0 14.7  Hematocrit 39.0 - 52.0 % 51.9 53.8(H) 51.7  Platelets 150 - 400 K/uL 865(H) 917(HH) 1,005(HH)    . CMP Latest Ref Rng & Units 10/25/2019 10/24/2019 10/23/2019  Glucose 70 - 99 mg/dL 91 - 99  BUN 8 - 23 mg/dL 27(H) - 28(H)  Creatinine 0.61 - 1.24 mg/dL 1.63(H) 1.42(H) 1.51(H)  Sodium 135 - 145 mmol/L 139 - 136  Potassium 3.5 - 5.1 mmol/L 4.1 - 4.2  Chloride 98 - 111 mmol/L 105 - 105  CO2 22 - 32 mmol/L 24 - 23  Calcium 8.9 - 10.3 mg/dL 8.9 - 9.2  Total Protein 6.5 - 8.1 g/dL 6.0(L) - -  Total Bilirubin 0.3 - 1.2 mg/dL 1.2 - -  Alkaline Phos 38 - 126 U/L 112 - -  AST 15 - 41 U/L 27 - -  ALT 0 - 44 U/L 13 - -     RADIOGRAPHIC STUDIES: I have personally reviewed the radiological images as listed and agreed with the findings in the report. Dg Elbow Complete Left  Result Date: 10/23/2019 CLINICAL DATA:  Arm pain and swelling EXAM: LEFT ELBOW - COMPLETE 3+ VIEW COMPARISON:  None. FINDINGS: No fracture or malalignment. No significant elbow effusion. Diffuse soft tissue edema IMPRESSION: No acute osseous abnormality Electronically Signed   By: Donavan Foil M.D.    On: 10/23/2019 16:48   Mr Brain W And Wo Contrast  Addendum Date: 10/24/2019   ADDENDUM REPORT: 10/24/2019 18:32 ADDENDUM: Study discussed by telephone with Dr. Karleen Hampshire on 10/24/2019 at 1818 hours Electronically Signed   By: Genevie Ann M.D.   On: 10/24/2019 18:32   Result Date: 10/24/2019 CLINICAL DATA:  73 year old male with suspected dural sinus thrombosis. Severe polycythemia, thrombocytosis, persistent  headache. Suspicious CTV. EXAM: MRI HEAD WITHOUT AND WITH CONTRAST MR VENOGRAM HEAD WITHOUT AND WITH CONTRAST TECHNIQUE: Multiplanar, multiecho pulse sequences of the brain and surrounding structures were obtained without and with intravenous contrast. Angiographic images of the intracranial venous structures were obtained using MRV technique without and with intravenous contrast. CONTRAST:  7.24m GADAVIST GADOBUTROL 1 MMOL/ML IV SOLN COMPARISON:  CT venogram yesterday. FINDINGS: MRI HEAD: Brain: No restricted diffusion or evidence of acute infarction. No midline shift, mass effect, evidence of mass lesion, ventriculomegaly, extra-axial collection or acute intracranial hemorrhage. Cervicomedullary junction and pituitary are within normal limits. Patchy and mild to moderate for age scattered cerebral white matter T2 and FLAIR hyperintensity in a nonspecific configuration. No cortical edema or encephalomalacia identified. No chronic cerebral blood products. Deep gray nuclei, brainstem and cerebellum appear negative. No abnormal hyperenhancement, no abnormal dural thickening identified. Vascular: Abnormal loss of the portions of the superior sagittal sinus, right sigmoid sinus and IJ bulb flow voids, with associated abnormal venous enhancement in the region which is further detailed below. Major arterial vascular flow voids at the skull base appear preserved. The left vertebral artery appears dominant. Skull and upper cervical spine: Negative visible cervical spine. Heterogeneous bone marrow signal throughout the  skull and visible spine although no destructive osseous lesion is identified. There is associated abnormal diffusion signal in the visible skeleton. Sinuses/Orbits: Postoperative changes to both globes. Otherwise negative orbits. Paranasal sinuses are clear. Other: Mild left mastoid effusion appears inconsequential. Visible internal auditory structures appear normal. MRV HEAD: 2D and 3D time-of-flight images demonstrate areas of lost signal abnormality in the posterosuperior sagittal sinus corresponding to heterogeneous enhancement of the sinus. There is absent flow signal in the right transverse sinus, sigmoid sinus and IJ bulb. There is preserved flow signal in the torcula, straight sinus, vein of Galen, internal cerebral veins, basal veins of Rosenthal as well as non dominant appearing left transverse sinus, left sigmoid sinus, and left IJ bulb. Post-contrast images corroborate areas of posterosuperior sagittal sinus thrombosis (beginning at the vertex series 4, image 120) and continuing through the torcula (image 54). Intermittent thrombosis of the right transverse sinus. More pronounced thrombosis of the right sigmoid sinus continuing to the right IJ bulb (image 16). And also thrombosis of the right paravertebral emissary vein on series 4, image 10. IMPRESSION: 1. Positive for dural venous sinus thrombosis occurring intermittently from the posterosuperior sagittal sinus through the dominant right transverse and sigmoid sinuses, also involving the right IJ bulb and another emissary vein at the right skull base. 2. Negative for associated acute infarct or cerebral edema. 3. Abnormal bone marrow signal suspicious for an infiltrative marrow process in this clinical setting. No destructive osseous lesion identified. 4. Mild to moderate for age nonspecific cerebral white matter signal changes, most commonly due to chronic small vessel disease. Electronically Signed: By: HGenevie AnnM.D. On: 10/24/2019 18:09   [place]orea Renal  Result Date: 10/24/2019 CLINICAL DATA:  Acute renal insufficiency EXAM: RENAL / URINARY TRACT ULTRASOUND COMPLETE COMPARISON:  None. FINDINGS: Right Kidney: Renal measurements: 10.3 x 3.5 x 5.1 cm = volume: 96.7 mL . Echogenicity within normal limits. No mass or hydronephrosis visualized. Left Kidney: Renal measurements: 10.4 x 6.0 x 4.4 cm = volume: 141.5 mL. Echogenicity within normal limits. No mass or hydronephrosis visualized. Bladder: Appears normal for degree of bladder distention. Other: None. IMPRESSION: Normal study.  No cause for acute renal insufficiency identified. Electronically Signed   By: DDorise BullionIII M.D  On: 10/24/2019 19:16   US Venous Img Upper Left (dvt Study)  Result Date: 10/23/2019 CLINICAL DATA:  Bruising and swelling of left arm from the biceps to the wrist for past 5 days. Evaluate for DVT. EXAM: LEFT UPPER EXTREMITY VENOUS DOPPLER ULTRASOUND TECHNIQUE: Gray-scale sonography with graded compression, as well as color Doppler and duplex ultrasound were performed to evaluate the upper extremity deep venous system from the level of the subclavian vein and including the jugular, axillary, basilic, radial, ulnar and upper cephalic vein. Spectral Doppler was utilized to evaluate flow at rest and with distal augmentation maneuvers. COMPARISON:  None. FINDINGS: Contralateral Subclavian Vein: Respiratory phasicity is normal and symmetric with the symptomatic side. No evidence of thrombus. Normal compressibility. Internal Jugular Vein: No evidence of thrombus. Normal compressibility, respiratory phasicity and response to augmentation. Subclavian Vein: No evidence of thrombus. Normal compressibility, respiratory phasicity and response to augmentation. Axillary Vein: No evidence of thrombus. Normal compressibility, respiratory phasicity and response to augmentation. Cephalic Vein: No evidence of thrombus. Normal compressibility, respiratory phasicity and response to augmentation.  Basilic Vein: No evidence of thrombus. Normal compressibility, respiratory phasicity and response to augmentation. Brachial Veins: No evidence of thrombus. Normal compressibility, respiratory phasicity and response to augmentation. Radial Veins: No evidence of thrombus. Normal compressibility, respiratory phasicity and response to augmentation. Ulnar Veins: No evidence of thrombus. Normal compressibility, respiratory phasicity and response to augmentation. Venous Reflux:  None visualized. Other Findings: There are 2 adjacent soft tissue nodules at the mid humeral aspect of the left upper arm associated with the adjacent bicipital musculature with dominant nodule measuring approximately 2.9 x 2.1 cm and smaller adjacent nodule measuring 1.3 x 1.6 x 1.1 cm. IMPRESSION: 1. No evidence of DVT within the left upper extremity. 2. Soft tissue nodules at the mid humeral aspect of the left upper arm associated with the bicipital musculature, nonspecific though could represent a bicipital muscular tear (either partial or complete) with associated hematoma. Clinical correlation is advised. Electronically Signed   By: Sandi Mariscal M.D.   On: 10/23/2019 17:00   Mr Mrv Miguel Dibble FH Contrast  Addendum Date: 10/24/2019   ADDENDUM REPORT: 10/24/2019 18:32 ADDENDUM: Study discussed by telephone with Dr. Karleen Hampshire on 10/24/2019 at 1818 hours Electronically Signed   By: Genevie Ann M.D.   On: 10/24/2019 18:32   Result Date: 10/24/2019 CLINICAL DATA:  73 year old male with suspected dural sinus thrombosis. Severe polycythemia, thrombocytosis, persistent headache. Suspicious CTV. EXAM: MRI HEAD WITHOUT AND WITH CONTRAST MR VENOGRAM HEAD WITHOUT AND WITH CONTRAST TECHNIQUE: Multiplanar, multiecho pulse sequences of the brain and surrounding structures were obtained without and with intravenous contrast. Angiographic images of the intracranial venous structures were obtained using MRV technique without and with intravenous contrast. CONTRAST:   7.80m GADAVIST GADOBUTROL 1 MMOL/ML IV SOLN COMPARISON:  CT venogram yesterday. FINDINGS: MRI HEAD: Brain: No restricted diffusion or evidence of acute infarction. No midline shift, mass effect, evidence of mass lesion, ventriculomegaly, extra-axial collection or acute intracranial hemorrhage. Cervicomedullary junction and pituitary are within normal limits. Patchy and mild to moderate for age scattered cerebral white matter T2 and FLAIR hyperintensity in a nonspecific configuration. No cortical edema or encephalomalacia identified. No chronic cerebral blood products. Deep gray nuclei, brainstem and cerebellum appear negative. No abnormal hyperenhancement, no abnormal dural thickening identified. Vascular: Abnormal loss of the portions of the superior sagittal sinus, right sigmoid sinus and IJ bulb flow voids, with associated abnormal venous enhancement in the region which is further detailed below. Major arterial vascular  flow voids at the skull base appear preserved. The left vertebral artery appears dominant. Skull and upper cervical spine: Negative visible cervical spine. Heterogeneous bone marrow signal throughout the skull and visible spine although no destructive osseous lesion is identified. There is associated abnormal diffusion signal in the visible skeleton. Sinuses/Orbits: Postoperative changes to both globes. Otherwise negative orbits. Paranasal sinuses are clear. Other: Mild left mastoid effusion appears inconsequential. Visible internal auditory structures appear normal. MRV HEAD: 2D and 3D time-of-flight images demonstrate areas of lost signal abnormality in the posterosuperior sagittal sinus corresponding to heterogeneous enhancement of the sinus. There is absent flow signal in the right transverse sinus, sigmoid sinus and IJ bulb. There is preserved flow signal in the torcula, straight sinus, vein of Galen, internal cerebral veins, basal veins of Rosenthal as well as non dominant appearing left  transverse sinus, left sigmoid sinus, and left IJ bulb. Post-contrast images corroborate areas of posterosuperior sagittal sinus thrombosis (beginning at the vertex series 4, image 120) and continuing through the torcula (image 54). Intermittent thrombosis of the right transverse sinus. More pronounced thrombosis of the right sigmoid sinus continuing to the right IJ bulb (image 16). And also thrombosis of the right paravertebral emissary vein on series 4, image 10. IMPRESSION: 1. Positive for dural venous sinus thrombosis occurring intermittently from the posterosuperior sagittal sinus through the dominant right transverse and sigmoid sinuses, also involving the right IJ bulb and another emissary vein at the right skull base. 2. Negative for associated acute infarct or cerebral edema. 3. Abnormal bone marrow signal suspicious for an infiltrative marrow process in this clinical setting. No destructive osseous lesion identified. 4. Mild to moderate for age nonspecific cerebral white matter signal changes, most commonly due to chronic small vessel disease. Electronically Signed: By: Genevie Ann M.D. On: 10/24/2019 18:09   Ct Venogram Head  Result Date: 10/23/2019 CLINICAL DATA:  Dural venous sinus thrombosis suspected, several weeks of headaches, platelets over 1 million, concern for venous sinus thrombosis versus bleed. EXAM: CT VENOGRAM HEAD TECHNIQUE: Noncontrast head CT was performed. Coronal and sagittal reconstructions were also submitted for evaluation. Postcontrast CT venography of the head was also performed. Coronal and sagittal reconstructions as well as MIP reconstructions were submitted for evaluation. CONTRAST:  72m OMNIPAQUE IOHEXOL 300 MG/ML  SOLN COMPARISON:  No pertinent prior studies available for comparison. FINDINGS: There is no evidence of acute intracranial hemorrhage. No demarcated cortical infarction. No evidence of intracranial mass. No midline shift or extra-axial fluid collection. Moderate  patchy hypodensity within the cerebral white matter is nonspecific, but consistent with chronic small vessel ischemic disease. Mild generalized parenchymal atrophy. There is paucity of enhancement within portions of the right sigmoid sinus concerning for possible dural venous sinus thrombosis (for instance as seen on series 13, images 13-24 and series 7, images 29-30). The superior sagittal sinus is diminutive inferiorly, although patent without without convincing evidence of dural venous sinus thrombosis. The internal cerebral veins, vein of Galen, straight sinus, bilateral transverse and left sigmoid sinuses are patent. No hyperdense vessel. No calvarial fracture or suspicious osseous lesion. Visualized orbits demonstrate no acute abnormality. Minimal ethmoid sinus mucosal thickening. No significant mastoid effusion. These results were called by telephone at the time of interpretation on 10/23/2019 at 8:14 pm [facility] who verbally acknowledged these results. IMPRESSION: 1. Paucity of enhancement within portions of the right sigmoid sinus concerning for possible dural venous sinus thrombosis. MRI and MRV head with and without contrast is recommended for further  evaluation. 2. No evidence of intracranial hemorrhage or demarcated infarct. 3. Mild generalized parenchymal atrophy and chronic small vessel ischemic disease. Electronically Signed   By: Kellie Simmering DO   On: 10/23/2019 20:15    ASSESSMENT & PLAN:   73 year old male with  #1 Severe thrombocytosis with platelets of 1005k. #2 leukocytosis/primary neutrophilia. #3 polycythemia with a hematocrit of 51.7. #4 Dural venous sinus thrombosis -likely triggered by his polycythemia and thrombocytosis. #5 left arm hematoma likely related to a bicipital tendon injury. #6 RBC microcytosis likely from iron depletion from possible MPN  Patient's overall presentation is highly concerning for a newly diagnosed myeloproliferative neoplasm  likely polycythemia vera or essential thrombocytosis.  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 Plan -Labs done today are reviewed. -Platelets coming down to the 800k range. -No prohibitive toxicity from continuing hydroxyurea 5 mg p.o. twice daily with meals. -Hematocrit still around 52.  After obtaining consent from patient did place an order for therapeutic phlebotomy of about 1 pint of blood at bedside today. -Encouraged patient to maintain good p.o. hydration in addition to the IV fluids he is receiving. -Continues to be on IV heparin as per neurology and hospitalist.  Would be reasonable to transition to an oral anticoagulant on discharge. -We will check factor V Leiden and prothrombin gene mutation with a.m. labs to rule out any other additional risk factors for his dural venous sinus thrombosis. -Will need to get therapeutic phlebotomies once to twice a week to target hematocrit of 45 or less. -Management of headaches and migraine as per neurology and hospitalist. -I have ordered work-up for clonal studies including Jak 2 V617F mutation, JAK2 exon 12 mutation, Calreticulin mutation and BCR ABL mutation-pending will likely need at least 1 to 2 weeks to result. -No overt indication for bone marrow biopsy at this time will consider if needed. -Pending hemoglobinopathy evaluation in lab -I have sent a scheduling message to our oncology scheduler to schedule the patient for follow-up with me in about 1 week with labs and an appointment for his next therapeutic phlebotomy. -Anticipate discharge early next week if stable.   I spent 25 minutes counseling the patient face to face. The total time spent in the appointment was 35 minutes and more than 50% was on counseling and direct patient cares.    Sullivan Lone MD Dora AAHIVMS [facility] Hematology/Oncology Physician [facility]  (Office):       [phone] (Work cell):  [phone] (Fax):            [phone]  10/25/2019 1:41 PM

## 2019-10-25 NOTE — Progress Notes (Signed)
PROGRESS NOTE    Dillon Hill  OMB:[id number] DOB: 10-25-1946 DOA: 10/23/2019 PCP: [name], MD    Brief Narrative: 73 year old gentleman with prior history of migraine headaches, BPH, degenerative disc disease, hyperlipidemia, presents to ED with left arm hematoma and migraine headaches..  On arrival to ED he was found to have thrombocytosis with platelet count in 1000000, polycythemia.  CT venogram of the head Paucity of enhancement within portions of the right sigmoid sinus concerning for possible dural venous sinus thrombosis. MRI and MRV head with and without contrast is recommended for further evaluation.  Neurology was consulted who recommended getting MRI of the head with and without contrast and MRV of the head.  Meanwhile oncology consulted for evaluation of thrombocythemia.  Patient seen and examined today at bedside in the morning and with his daughter later this evening.  Patient reports headache is 10 out of 10.  Discussed with the patient and daughter the complications thrombocythemia and sinus thrombosis and IV anticoagulation.  Assessment & Plan:   Active Problems:   Thrombocytosis (HCC)   Thrombocythemia (HCC)   Acute migraine headaches/  dural venous sinus thrombosis of the right sigmoid sinus Going on for few months now.    MRI brain and MRV shows extensive dural venous sinus thrombosis of the right sigmoid sinus.  Started the patient on IV heparin. Discussed theresults with Dr Rory Percy and the patient and his daughter at bedside,  Recommended outpatient follow up with neurology in 4 to 6 weeks.   Patient's headache is 8 out of 10 today we will repeat a CT of the head without contrast for further evaluation.  And transfer the patient to stepdown for closer monitoring and for therapeutic phlebotomy.     Hypertensive urgency/emergency As needed IV labetalol and IV hydralazine ordered.  Last blood pressure around 160/80.   Severe thrombocytosis with platelet  counts greater than 1000 k on admission.  Improving with aggressive hydration and hydroxyurea. Oncology consulted and appreciate recommendations.  Patient's differential diagnosis include essential thrombocytosis versus polycythemia vera.  In view of his dural venous sinus thrombosis and concern for polycythemia, oncology schedule therapeutic phlebotomy for 1 pint of blood to bring down the hematocrit to 45 or less.. Jak 2 V617F mutation, JAK2 exon 12 mutation, Calreticulin mutation and BCR ABL mutation ordered by oncology.  The results might take 1 to 2 weeks to come back. Continue with  hydroxyurea and aggressive hydration. Factor V Leyden and prothrombin gene mutation were also added to the a.m. labs.   Stage III CKD Unclear what his baseline creatinine is.  Ultrasound renal negative for hydronephrosis. Creatinine appears to be stable at 1.6   DVT prophylaxis: (Heparin Code Status: Full code Family Communication: Discussed with daughter at bedside Disposition Plan: Pending clinical improvement and further evaluation of thrombocytosis and dural venous sinus thrombosis   Consultants:   Oncology  Neurology  Procedures: MRI of the head without and with contrast MRV of the head    Antimicrobials: None  Subjective: Headache is still 8 out of 10.  Patient denies any nausea vomiting dizziness.  Patient denies any chest pain shortness of breath and cough.  Objective: Vitals:   10/25/19 0603 10/25/19 0938 10/25/19 0938 10/25/19 1140  BP: (!) 190/86 (!) 184/104 (!) 184/104 (!) 187/106  Pulse: 71  79 83  Resp: 16     Temp: 97.8 F (36.6 C)     TempSrc: Oral     SpO2: 98%  99% 99%  Weight:  Height:        Intake/Output Summary (Last 24 hours) at 10/25/2019 1231 Last data filed at 10/25/2019 1000 Gross per 24 hour  Intake 2836.65 ml  Output 150 ml  Net 2686.65 ml   Filed Weights   10/23/19 1336  Weight: 70.3 kg    Examination:  General exam: Mild distress from  headache Respiratory system: Clear to auscultation bilaterally, no wheezing or rhonchi Cardiovascular system: S1-S2 heard, regular rate rhythm, no pedal edema Gastrointestinal system: Abdomen is soft, nontender, nondistended, bowel sounds normal Central nervous system: Alert and oriented to place person and time, no focal deficits this morning. Extremities: Left upper extremity swelling improving Skin: No rashes Psychiatry: Patient appears anxious.    Data Reviewed: I have personally reviewed following labs and imaging studies  CBC: Recent Labs  Lab 10/23/19 1411 10/24/19 0756 10/25/19 0317  WBC 16.1* 16.7* 16.7*  NEUTROABS 11.2*  --  11.0*  HGB 14.7 15.0 14.3  HCT 51.7 53.8* 51.9  MCV 68.8* 69.0* 69.7*  PLT 1,005* 917* 902*   Basic Metabolic Panel: Recent Labs  Lab 10/23/19 1411 10/24/19 0756 10/25/19 0317  NA 136  --  139  K 4.2  --  4.1  CL 105  --  105  CO2 23  --  24  GLUCOSE 99  --  91  BUN 28*  --  27*  CREATININE 1.51* 1.42* 1.63*  CALCIUM 9.2  --  8.9   GFR: Estimated Creatinine Clearance: 40.7 mL/min (A) (by C-G formula based on SCr of 1.63 mg/dL (H)). Liver Function Tests: Recent Labs  Lab 10/25/19 0317  AST 27  ALT 13  ALKPHOS 112  BILITOT 1.2  PROT 6.0*  ALBUMIN 3.2*   No results for input(s): LIPASE, AMYLASE in the last 168 hours. No results for input(s): AMMONIA in the last 168 hours. Coagulation Profile: Recent Labs  Lab 10/23/19 1411 10/24/19 1941 10/25/19 0317  INR 1.1 1.2 1.2   Cardiac Enzymes: No results for input(s): CKTOTAL, CKMB, CKMBINDEX, TROPONINI in the last 168 hours. BNP (last 3 results) No results for input(s): PROBNP in the last 8760 hours. HbA1C: No results for input(s): HGBA1C in the last 72 hours. CBG: No results for input(s): GLUCAP in the last 168 hours. Lipid Profile: No results for input(s): CHOL, HDL, LDLCALC, TRIG, CHOLHDL, LDLDIRECT in the last 72 hours. Thyroid Function Tests: No results for input(s):  TSH, T4TOTAL, FREET4, T3FREE, THYROIDAB in the last 72 hours. Anemia Panel: Recent Labs    10/24/19 1417  FERRITIN 31  TIBC 426  IRON 35*   Sepsis Labs: No results for input(s): PROCALCITON, LATICACIDVEN in the last 168 hours.  Recent Results (from the past 240 hour(s))  SARS Coronavirus 2 Ag (30 min TAT) - Nasal Swab (BD Veritor Kit)     Status: None   Collection Time: 10/23/19  8:22 PM   Specimen: Nasal Swab (BD Veritor Kit)  Result Value Ref Range Status   SARS Coronavirus 2 Ag NEGATIVE NEGATIVE Final    Comment: (NOTE) SARS-CoV-2 antigen NOT DETECTED.  Negative results are presumptive.  Negative results do not preclude SARS-CoV-2 infection and should not be used as the sole basis for treatment or other patient management decisions, including infection  control decisions, particularly in the presence of clinical signs and  symptoms consistent with COVID-19, or in those who have been in contact with the virus.  Negative results must be combined with clinical observations, patient history, and epidemiological information. The expected result is Negative.  Fact Sheet for Patients: PodPark.tn Fact Sheet for Healthcare Providers: GiftContent.is This test is not yet approved or cleared by the [place] FDA and  has been authorized for detection and/or diagnosis of SARS-CoV-2 by FDA under an Emergency Use Authorization (EUA).  This EUA will remain in effect (meaning this test can be used) for the duration of  the COVID-19 de claration under Section 564(b)(1) of the Act, 21 U.S.C. section 360bbb-3(b)(1), unless the authorization is terminated or revoked sooner. Performed at [facility], [address] [place], [place] [address]   SARS CORONAVIRUS 2 (TAT 6-24 HRS) Nasopharyngeal Nasopharyngeal Swab     Status: None   Collection Time: 10/23/19  9:11 PM   Specimen: Nasopharyngeal Swab  Result Value Ref  Range Status   SARS Coronavirus 2 NEGATIVE NEGATIVE Final    Comment: (NOTE) SARS-CoV-2 target nucleic acids are NOT DETECTED. The SARS-CoV-2 RNA is generally detectable in upper and lower respiratory specimens during the acute phase of infection. Negative results do not preclude SARS-CoV-2 infection, do not rule out co-infections with other pathogens, and should not be used as the sole basis for treatment or other patient management decisions. Negative results must be combined with clinical observations, patient history, and epidemiological information. The expected result is Negative. Fact Sheet for Patients: SugarRoll.be Fact Sheet for Healthcare Providers: https://www.woods-mathews.com/ This test is not yet approved or cleared by the [place] FDA and  has been authorized for detection and/or diagnosis of SARS-CoV-2 by FDA under an Emergency Use Authorization (EUA). This EUA will remain  in effect (meaning this test can be used) for the duration of the COVID-19 declaration under Section 56 4(b)(1) of the Act, 21 U.S.C. section 360bbb-3(b)(1), unless the authorization is terminated or revoked sooner. Performed at [facility], [place], [place],  [address]          Radiology Studies: Dg Elbow Complete Left  Result Date: 10/23/2019 CLINICAL DATA:  Arm pain and swelling EXAM: LEFT ELBOW - COMPLETE 3+ VIEW COMPARISON:  None. FINDINGS: No fracture or malalignment. No significant elbow effusion. Diffuse soft tissue edema IMPRESSION: No acute osseous abnormality Electronically Signed   By: Donavan Foil M.D.   On: 10/23/2019 16:48   Mr Brain W And Wo Contrast  Addendum Date: 10/24/2019   ADDENDUM REPORT: 10/24/2019 18:32 ADDENDUM: Study discussed by telephone with Dr. Karleen Hampshire on 10/24/2019 at 1818 hours Electronically Signed   By: Genevie Ann M.D.   On: 10/24/2019 18:32   Result Date: 10/24/2019 CLINICAL DATA:  73 year old  male with suspected dural sinus thrombosis. Severe polycythemia, thrombocytosis, persistent headache. Suspicious CTV. EXAM: MRI HEAD WITHOUT AND WITH CONTRAST MR VENOGRAM HEAD WITHOUT AND WITH CONTRAST TECHNIQUE: Multiplanar, multiecho pulse sequences of the brain and surrounding structures were obtained without and with intravenous contrast. Angiographic images of the intracranial venous structures were obtained using MRV technique without and with intravenous contrast. CONTRAST:  7.63m GADAVIST GADOBUTROL 1 MMOL/ML IV SOLN COMPARISON:  CT venogram yesterday. FINDINGS: MRI HEAD: Brain: No restricted diffusion or evidence of acute infarction. No midline shift, mass effect, evidence of mass lesion, ventriculomegaly, extra-axial collection or acute intracranial hemorrhage. Cervicomedullary junction and pituitary are within normal limits. Patchy and mild to moderate for age scattered cerebral white matter T2 and FLAIR hyperintensity in a nonspecific configuration. No cortical edema or encephalomalacia identified. No chronic cerebral blood products. Deep gray nuclei, brainstem and cerebellum appear negative. No abnormal hyperenhancement, no abnormal dural thickening identified. Vascular: Abnormal loss  of the portions of the superior sagittal sinus, right sigmoid sinus and IJ bulb flow voids, with associated abnormal venous enhancement in the region which is further detailed below. Major arterial vascular flow voids at the skull base appear preserved. The left vertebral artery appears dominant. Skull and upper cervical spine: Negative visible cervical spine. Heterogeneous bone marrow signal throughout the skull and visible spine although no destructive osseous lesion is identified. There is associated abnormal diffusion signal in the visible skeleton. Sinuses/Orbits: Postoperative changes to both globes. Otherwise negative orbits. Paranasal sinuses are clear. Other: Mild left mastoid effusion appears inconsequential.  Visible internal auditory structures appear normal. MRV HEAD: 2D and 3D time-of-flight images demonstrate areas of lost signal abnormality in the posterosuperior sagittal sinus corresponding to heterogeneous enhancement of the sinus. There is absent flow signal in the right transverse sinus, sigmoid sinus and IJ bulb. There is preserved flow signal in the torcula, straight sinus, vein of Galen, internal cerebral veins, basal veins of Rosenthal as well as non dominant appearing left transverse sinus, left sigmoid sinus, and left IJ bulb. Post-contrast images corroborate areas of posterosuperior sagittal sinus thrombosis (beginning at the vertex series 4, image 120) and continuing through the torcula (image 54). Intermittent thrombosis of the right transverse sinus. More pronounced thrombosis of the right sigmoid sinus continuing to the right IJ bulb (image 16). And also thrombosis of the right paravertebral emissary vein on series 4, image 10. IMPRESSION: 1. Positive for dural venous sinus thrombosis occurring intermittently from the posterosuperior sagittal sinus through the dominant right transverse and sigmoid sinuses, also involving the right IJ bulb and another emissary vein at the right skull base. 2. Negative for associated acute infarct or cerebral edema. 3. Abnormal bone marrow signal suspicious for an infiltrative marrow process in this clinical setting. No destructive osseous lesion identified. 4. Mild to moderate for age nonspecific cerebral white matter signal changes, most commonly due to chronic small vessel disease. Electronically Signed: By: Genevie Ann M.D. On: 10/24/2019 18:09   US Renal  Result Date: 10/24/2019 CLINICAL DATA:  Acute renal insufficiency EXAM: RENAL / URINARY TRACT ULTRASOUND COMPLETE COMPARISON:  None. FINDINGS: Right Kidney: Renal measurements: 10.3 x 3.5 x 5.1 cm = volume: 96.7 mL . Echogenicity within normal limits. No mass or hydronephrosis visualized. Left Kidney: Renal  measurements: 10.4 x 6.0 x 4.4 cm = volume: 141.5 mL. Echogenicity within normal limits. No mass or hydronephrosis visualized. Bladder: Appears normal for degree of bladder distention. Other: None. IMPRESSION: Normal study.  No cause for acute renal insufficiency identified. Electronically Signed   By: Dorise Bullion III M.D   On: 10/24/2019 19:16   US Venous Img Upper Left (dvt Study)  Result Date: 10/23/2019 CLINICAL DATA:  Bruising and swelling of left arm from the biceps to the wrist for past 5 days. Evaluate for DVT. EXAM: LEFT UPPER EXTREMITY VENOUS DOPPLER ULTRASOUND TECHNIQUE: Gray-scale sonography with graded compression, as well as color Doppler and duplex ultrasound were performed to evaluate the upper extremity deep venous system from the level of the subclavian vein and including the jugular, axillary, basilic, radial, ulnar and upper cephalic vein. Spectral Doppler was utilized to evaluate flow at rest and with distal augmentation maneuvers. COMPARISON:  None. FINDINGS: Contralateral Subclavian Vein: Respiratory phasicity is normal and symmetric with the symptomatic side. No evidence of thrombus. Normal compressibility. Internal Jugular Vein: No evidence of thrombus. Normal compressibility, respiratory phasicity and response to augmentation. Subclavian Vein: No evidence of thrombus. Normal compressibility, respiratory phasicity and  response to augmentation. Axillary Vein: No evidence of thrombus. Normal compressibility, respiratory phasicity and response to augmentation. Cephalic Vein: No evidence of thrombus. Normal compressibility, respiratory phasicity and response to augmentation. Basilic Vein: No evidence of thrombus. Normal compressibility, respiratory phasicity and response to augmentation. Brachial Veins: No evidence of thrombus. Normal compressibility, respiratory phasicity and response to augmentation. Radial Veins: No evidence of thrombus. Normal compressibility, respiratory phasicity  and response to augmentation. Ulnar Veins: No evidence of thrombus. Normal compressibility, respiratory phasicity and response to augmentation. Venous Reflux:  None visualized. Other Findings: There are 2 adjacent soft tissue nodules at the mid humeral aspect of the left upper arm associated with the adjacent bicipital musculature with dominant nodule measuring approximately 2.9 x 2.1 cm and smaller adjacent nodule measuring 1.3 x 1.6 x 1.1 cm. IMPRESSION: 1. No evidence of DVT within the left upper extremity. 2. Soft tissue nodules at the mid humeral aspect of the left upper arm associated with the bicipital musculature, nonspecific though could represent a bicipital muscular tear (either partial or complete) with associated hematoma. Clinical correlation is advised. Electronically Signed   By: Sandi Mariscal M.D.   On: 10/23/2019 17:00   Mr Mrv Miguel Dibble RJ Contrast  Addendum Date: 10/24/2019   ADDENDUM REPORT: 10/24/2019 18:32 ADDENDUM: Study discussed by telephone with Dr. Karleen Hampshire on 10/24/2019 at 1818 hours Electronically Signed   By: Genevie Ann M.D.   On: 10/24/2019 18:32   Result Date: 10/24/2019 CLINICAL DATA:  73 year old male with suspected dural sinus thrombosis. Severe polycythemia, thrombocytosis, persistent headache. Suspicious CTV. EXAM: MRI HEAD WITHOUT AND WITH CONTRAST MR VENOGRAM HEAD WITHOUT AND WITH CONTRAST TECHNIQUE: Multiplanar, multiecho pulse sequences of the brain and surrounding structures were obtained without and with intravenous contrast. Angiographic images of the intracranial venous structures were obtained using MRV technique without and with intravenous contrast. CONTRAST:  7.86m GADAVIST GADOBUTROL 1 MMOL/ML IV SOLN COMPARISON:  CT venogram yesterday. FINDINGS: MRI HEAD: Brain: No restricted diffusion or evidence of acute infarction. No midline shift, mass effect, evidence of mass lesion, ventriculomegaly, extra-axial collection or acute intracranial hemorrhage. Cervicomedullary  junction and pituitary are within normal limits. Patchy and mild to moderate for age scattered cerebral white matter T2 and FLAIR hyperintensity in a nonspecific configuration. No cortical edema or encephalomalacia identified. No chronic cerebral blood products. Deep gray nuclei, brainstem and cerebellum appear negative. No abnormal hyperenhancement, no abnormal dural thickening identified. Vascular: Abnormal loss of the portions of the superior sagittal sinus, right sigmoid sinus and IJ bulb flow voids, with associated abnormal venous enhancement in the region which is further detailed below. Major arterial vascular flow voids at the skull base appear preserved. The left vertebral artery appears dominant. Skull and upper cervical spine: Negative visible cervical spine. Heterogeneous bone marrow signal throughout the skull and visible spine although no destructive osseous lesion is identified. There is associated abnormal diffusion signal in the visible skeleton. Sinuses/Orbits: Postoperative changes to both globes. Otherwise negative orbits. Paranasal sinuses are clear. Other: Mild left mastoid effusion appears inconsequential. Visible internal auditory structures appear normal. MRV HEAD: 2D and 3D time-of-flight images demonstrate areas of lost signal abnormality in the posterosuperior sagittal sinus corresponding to heterogeneous enhancement of the sinus. There is absent flow signal in the right transverse sinus, sigmoid sinus and IJ bulb. There is preserved flow signal in the torcula, straight sinus, vein of Galen, internal cerebral veins, basal veins of Rosenthal as well as non dominant appearing left transverse sinus, left sigmoid sinus, and left IJ  bulb. Post-contrast images corroborate areas of posterosuperior sagittal sinus thrombosis (beginning at the vertex series 4, image 120) and continuing through the torcula (image 54). Intermittent thrombosis of the right transverse sinus. More pronounced thrombosis  of the right sigmoid sinus continuing to the right IJ bulb (image 16). And also thrombosis of the right paravertebral emissary vein on series 4, image 10. IMPRESSION: 1. Positive for dural venous sinus thrombosis occurring intermittently from the posterosuperior sagittal sinus through the dominant right transverse and sigmoid sinuses, also involving the right IJ bulb and another emissary vein at the right skull base. 2. Negative for associated acute infarct or cerebral edema. 3. Abnormal bone marrow signal suspicious for an infiltrative marrow process in this clinical setting. No destructive osseous lesion identified. 4. Mild to moderate for age nonspecific cerebral white matter signal changes, most commonly due to chronic small vessel disease. Electronically Signed: By: Genevie Ann M.D. On: 10/24/2019 18:09   Ct Venogram Head  Result Date: 10/23/2019 CLINICAL DATA:  Dural venous sinus thrombosis suspected, several weeks of headaches, platelets over 1 million, concern for venous sinus thrombosis versus bleed. EXAM: CT VENOGRAM HEAD TECHNIQUE: Noncontrast head CT was performed. Coronal and sagittal reconstructions were also submitted for evaluation. Postcontrast CT venography of the head was also performed. Coronal and sagittal reconstructions as well as MIP reconstructions were submitted for evaluation. CONTRAST:  83m OMNIPAQUE IOHEXOL 300 MG/ML  SOLN COMPARISON:  No pertinent prior studies available for comparison. FINDINGS: There is no evidence of acute intracranial hemorrhage. No demarcated cortical infarction. No evidence of intracranial mass. No midline shift or extra-axial fluid collection. Moderate patchy hypodensity within the cerebral white matter is nonspecific, but consistent with chronic small vessel ischemic disease. Mild generalized parenchymal atrophy. There is paucity of enhancement within portions of the right sigmoid sinus concerning for possible dural venous sinus thrombosis (for instance as seen  on series 13, images 13-24 and series 7, images 29-30). The superior sagittal sinus is diminutive inferiorly, although patent without without convincing evidence of dural venous sinus thrombosis. The internal cerebral veins, vein of Galen, straight sinus, bilateral transverse and left sigmoid sinuses are patent. No hyperdense vessel. No calvarial fracture or suspicious osseous lesion. Visualized orbits demonstrate no acute abnormality. Minimal ethmoid sinus mucosal thickening. No significant mastoid effusion. These results were called by telephone at the time of interpretation on 10/23/2019 at 8:14 pm [facility] who verbally acknowledged these results. IMPRESSION: 1. Paucity of enhancement within portions of the right sigmoid sinus concerning for possible dural venous sinus thrombosis. MRI and MRV head with and without contrast is recommended for further evaluation. 2. No evidence of intracranial hemorrhage or demarcated infarct. 3. Mild generalized parenchymal atrophy and chronic small vessel ischemic disease. Electronically Signed   By: KKellie SimmeringDO   On: 10/23/2019 20:15        Scheduled Meds:  aspirin EC  81 mg Oral Daily   folic acid  1 mg Oral Daily   hydroxyurea  500 mg Oral BID WC   multivitamin with minerals  1 tablet Oral Daily   sodium chloride flush  3 mL Intravenous Q12H   sodium chloride flush  3 mL Intravenous Q12H   Continuous Infusions:  sodium chloride     sodium chloride 125 mL/hr at 10/25/19 1042   heparin 1,000 Units/hr (10/25/19 0849)     LOS: 2 days        VHosie Poisson MD [facility] 10/25/2019, 12:31 PM

## 2019-10-25 NOTE — Plan of Care (Signed)
  Problem: Clinical Measurements: Goal: Diagnostic test results will improve Outcome: Progressing   Problem: Nutrition: Goal: Adequate nutrition will be maintained Outcome: Progressing   

## 2019-10-25 NOTE — Plan of Care (Signed)

## 2019-10-25 NOTE — Progress Notes (Signed)
Therapeutic Phlebotomy removed 400 ml of blood from upper arm [id number] IV without complications or symptoms.

## 2019-10-25 NOTE — Progress Notes (Addendum)
ANTICOAGULATION CONSULT NOTE - Initial Consult  Pharmacy Consult for heparin Indication: Dural venous sinus thrombosis  No Known Allergies  Patient Measurements: Height: 5\' 11"  (180.3 cm) Weight: 155 lb (70.3 kg) IBW/kg (Calculated) : 75.3 Heparin Dosing Weight = n/a. Use TBW since TBW < IBW. TBW = 70 kg  Vital Signs: Temp: 97.8 F (36.6 C) (12/06 0603) Temp Source: Oral (12/06 0603) BP: 190/86 (12/06 0603) Pulse Rate: 71 (12/06 0603)  Labs: Recent Labs    10/23/19 1411 10/24/19 0756 10/24/19 1941 10/25/19 0317 10/25/19 0725  HGB 14.7 15.0  --  14.3  --   HCT 51.7 53.8*  --  51.9  --   PLT 1,005* 917*  --  865*  --   APTT  --   --  43*  --   --   LABPROT 14.3  --  14.6 15.0  --   INR 1.1  --  1.2 1.2  --   HEPARINUNFRC  --   --   --   --  0.18*  CREATININE 1.51* 1.42*  --  1.63*  --     Estimated Creatinine Clearance: 40.7 mL/min (A) (by C-G formula based on SCr of 1.63 mg/dL (H)).   Medical History: Past Medical History:  Diagnosis Date  . BPH (benign prostatic hyperplasia)   . Colon polyp   . DDD (degenerative disc disease), cervical   . High cholesterol   . Migraine     Assessment: Pharmacy consulted to dose/monitor heparin infusion in this 37 yoM diagnosed with dural venous sinus thrombosis. Pt was not on anticoagulants PTA, has been on subcutaneous heparin for DVT ppx while inpatient. Pt found to have thrombocythemia on admission, neurology and oncology following.   Discussed with MD - will initiate heparin infusion with NO boluses and target lower end of therapeutic range due to bleeding risk.  Today, 10/25/19  Hgb 14.3 - WNL, stable  Plt 865, remain significantly elevated. Trending down  SCr 1.63, CrCl 41 mL/min. Slight increase in SCr  HL = 0.18 is subtherapeutic on heparin infusion of 850 units/hr.   Confirmed with RN that heparin infusing at correct rate with no issues/interruptions. No signs/symptoms of bleeding. Noted that patient  presented to the hospital with left arm hematoma, does have bruising on that arm, but has not worsened/spread since initiation of heparin.  Goal of Therapy:  Heparin level 0.3-0.5 units/ml Monitor platelets by anticoagulation protocol: Yes   Plan:   No heparin bolus  Increase heparin infusion to 1000 units/hr  Check HL in 8 hours  CBC and HL daily while on heparin infusion  Monitor closely for any signs/symptoms of bleeding  Follow along for transition to PO anticoagulation.   Lenis Noon, PharmD 10/25/2019,8:45 AM  Addendum - Evening Follow Up:  Assessment:   HL = 0.17 remains subtherapeutic on heparin infusion of 1000 units/hr despite increasing rate of heparin  Confirmed with RN that heparin infusing at correct rate with no issues/interruptions. No signs of bleeding. Left arm hematoma has not worsened.   Patients BP elevated and complaining of headache this afternoon. STAT CT head ordered: no midline shift, no acute intracranial hemorrhage identified. Pt continues on heparin infusion  Plan:  No bolus  Increase heparin infusion to 1200 units/hr  Check HL in 8 hours  CBC daily  Monitor closely for signs/symptoms of bleeding  Lenis Noon, PharmD 10/25/19 7:14 PM

## 2019-10-26 ENCOUNTER — Telehealth: Payer: Self-pay | Admitting: Hematology

## 2019-10-26 LAB — COMPREHENSIVE METABOLIC PANEL
ALT: 13 U/L (ref 0–44)
AST: 26 U/L (ref 15–41)
Albumin: 3.2 g/dL — ABNORMAL LOW (ref 3.5–5.0)
Alkaline Phosphatase: 127 U/L — ABNORMAL HIGH (ref 38–126)
Anion gap: 12 (ref 5–15)
BUN: 21 mg/dL (ref 8–23)
CO2: 22 mmol/L (ref 22–32)
Calcium: 9 mg/dL (ref 8.9–10.3)
Chloride: 103 mmol/L (ref 98–111)
Creatinine, Ser: 1.4 mg/dL — ABNORMAL HIGH (ref 0.61–1.24)
GFR calc Af Amer: 58 mL/min — ABNORMAL LOW (ref >60)
GFR calc non Af Amer: 50 mL/min — ABNORMAL LOW (ref >60)
Glucose, Bld: 110 mg/dL — ABNORMAL HIGH (ref 70–99)
Potassium: 3.9 mmol/L (ref 3.5–5.1)
Sodium: 137 mmol/L (ref 135–145)
Total Bilirubin: 1.1 mg/dL (ref 0.3–1.2)
Total Protein: 6.4 g/dL — ABNORMAL LOW (ref 6.5–8.1)

## 2019-10-26 LAB — CBC WITH DIFFERENTIAL/PLATELET
Abs Immature Granulocytes: 0.5 10*3/uL — ABNORMAL HIGH (ref 0.00–0.07)
Band Neutrophils: 25 %
Basophils Absolute: 1 10*3/uL — ABNORMAL HIGH (ref 0.0–0.1)
Basophils Relative: 4 %
Eosinophils Absolute: 0.5 10*3/uL (ref 0.0–0.5)
Eosinophils Relative: 2 %
HCT: 49.5 % (ref 39.0–52.0)
Hemoglobin: 14.1 g/dL (ref 13.0–17.0)
Lymphocytes Relative: 7 %
Lymphs Abs: 1.8 10*3/uL (ref 0.7–4.0)
MCH: 19.7 pg — ABNORMAL LOW (ref 26.0–34.0)
MCHC: 28.5 g/dL — ABNORMAL LOW (ref 30.0–36.0)
MCV: 69.3 fL — ABNORMAL LOW (ref 80.0–100.0)
Monocytes Absolute: 0.5 10*3/uL (ref 0.1–1.0)
Monocytes Relative: 2 %
Myelocytes: 2 %
Neutro Abs: 20.8 10*3/uL — ABNORMAL HIGH (ref 1.7–7.7)
Neutrophils Relative %: 58 %
Platelets: 860 10*3/uL — ABNORMAL HIGH (ref 150–400)
RBC: 7.14 MIL/uL — ABNORMAL HIGH (ref 4.22–5.81)
RDW: 26.9 % — ABNORMAL HIGH (ref 11.5–15.5)
WBC Morphology: INCREASED
WBC: 25.1 10*3/uL — ABNORMAL HIGH (ref 4.0–10.5)
nRBC: 0.5 % — ABNORMAL HIGH (ref 0.0–0.2)

## 2019-10-26 LAB — CBC
HCT: 47.4 % (ref 39.0–52.0)
Hemoglobin: 13.5 g/dL (ref 13.0–17.0)
MCH: 19.6 pg — ABNORMAL LOW (ref 26.0–34.0)
MCHC: 28.5 g/dL — ABNORMAL LOW (ref 30.0–36.0)
MCV: 68.8 fL — ABNORMAL LOW (ref 80.0–100.0)
Platelets: 843 10*3/uL — ABNORMAL HIGH (ref 150–400)
RBC: 6.89 MIL/uL — ABNORMAL HIGH (ref 4.22–5.81)
RDW: 26.8 % — ABNORMAL HIGH (ref 11.5–15.5)
WBC: 24.6 10*3/uL — ABNORMAL HIGH (ref 4.0–10.5)
nRBC: 0.5 % — ABNORMAL HIGH (ref 0.0–0.2)

## 2019-10-26 LAB — HEPARIN LEVEL (UNFRACTIONATED)
Heparin Unfractionated: 0.37 IU/mL (ref 0.30–0.70)
Heparin Unfractionated: 0.32 IU/mL (ref 0.30–0.70)

## 2019-10-26 LAB — URIC ACID: Uric Acid, Serum: 7.4 mg/dL (ref 3.7–8.6)

## 2019-10-26 LAB — MRSA PCR SCREENING: MRSA by PCR: NEGATIVE

## 2019-10-26 MED ORDER — AMLODIPINE BESYLATE 10 MG PO TABS
10.0000 mg | ORAL_TABLET | Freq: Every day | ORAL | Status: DC
  Administered 2019-10-26 – 2019-10-27 (×2): 10 mg via ORAL
  Filled 2019-10-26 (×2): qty 1

## 2019-10-26 MED ORDER — HYDRALAZINE HCL 25 MG PO TABS
25.0000 mg | ORAL_TABLET | Freq: Three times a day (TID) | ORAL | Status: DC
  Administered 2019-10-26 – 2019-10-27 (×4): 25 mg via ORAL
  Filled 2019-10-26 (×5): qty 1

## 2019-10-26 MED ORDER — APIXABAN 5 MG PO TABS
5.0000 mg | ORAL_TABLET | Freq: Two times a day (BID) | ORAL | Status: DC
  Administered 2019-10-26 – 2019-10-27 (×2): 5 mg via ORAL
  Filled 2019-10-26 (×2): qty 1

## 2019-10-26 NOTE — Telephone Encounter (Signed)
A hospital follow up appt has been scheduled for Dillon Hill to see Dr. Irene Limbo on 12/15 at 11am w/labs at 1030am. Pt is currently in the hospital. I update Dillon Hill and Dr. Irene Limbo.

## 2019-10-26 NOTE — Progress Notes (Signed)
ANTICOAGULATION CONSULT NOTE - [place] for heparin Indication: Dural venous sinus thrombosis  No Known Allergies  Patient Measurements: Height: 5\' 11"  (180.3 cm) Weight: 151 lb 3.8 oz (68.6 kg) IBW/kg (Calculated) : 75.3 Heparin Dosing Weight = n/a. Use TBW since TBW < IBW. TBW = 70 kg  Vital Signs: Temp: 97.5 F (36.4 C) (12/07 1100) Temp Source: Oral (12/07 1100) BP: 126/84 (12/07 1100) Pulse Rate: 83 (12/07 1100)  Labs: Recent Labs    10/23/19 1411 10/24/19 0756 10/24/19 1941 10/25/19 0317  10/25/19 1704 10/26/19 0037 10/26/19 0357 10/26/19 1153  HGB 14.7 15.0  --  14.3  --   --  13.5 14.1  --   HCT 51.7 53.8*  --  51.9  --   --  47.4 49.5  --   PLT 1,005* 917*  --  865*  --   --  843* 860*  --   APTT  --   --  43*  --   --   --   --   --   --   LABPROT 14.3  --  14.6 15.0  --   --   --   --   --   INR 1.1  --  1.2 1.2  --   --   --   --   --   HEPARINUNFRC  --   --   --   --    < > 0.17*  --  0.37 0.32  CREATININE 1.51* 1.42*  --  1.63*  --   --   --  1.40*  --    < > = values in this interval not displayed.    Estimated Creatinine Clearance: 46.3 mL/min (A) (by C-G formula based on SCr of 1.4 mg/dL (H)).   Medical History: Past Medical History:  Diagnosis Date  . BPH (benign prostatic hyperplasia)   . Colon polyp   . DDD (degenerative disc disease), cervical   . High cholesterol   . Migraine   . Polycythemia vera ([place])     Assessment: Pharmacy consulted to dose/monitor heparin infusion in this 43 yoM diagnosed with dural venous sinus thrombosis. Pt was not on anticoagulants PTA, has been on subcutaneous heparin for DVT ppx while inpatient. Pt found to have thrombocythemia on admission, neurology and oncology following.   Discussed with MD - will initiate heparin infusion with NO boluses and target lower end of therapeutic range due to bleeding risk.  Today, 10/26/19  Hgb 14.1 - WNL, stable  Plt 860, remain significantly  elevated. Trending down - has been started on hydroxyurea 500 mg BID   SCr 1.40, CrCl 46 mL/min. Slight decrease in SCr  HL = 0.32 is subtherapeutic on heparin infusion of 850 units/hr.   Confirmed with RN that heparin infusing at correct rate with no issues/interruptions. No signs/symptoms of bleeding. Noted that patient presented to the hospital with left arm hematoma, does have bruising on that arm, but has not worsened/spread since initiation of heparin.  Goal of Therapy:  Heparin level 0.3-0.5 units/ml Monitor platelets by anticoagulation protocol: Yes   Plan:   Continue heparin infusion at 1200 units/hr  CBC and HL daily while on heparin infusion  Monitor closely for any signs/symptoms of bleeding  Follow along for transition to PO anticoagulation.      Royetta Asal, PharmD, BCPS 10/26/2019 12:43 PM

## 2019-10-26 NOTE — Discharge Instructions (Signed)
Information on my medicine - ELIQUIS (apixaban)  Why was Eliquis prescribed for you? Eliquis was prescribed for you to treat a clot found in the vessels of the brain known as a dural venous sinus thrombosis.  What do You need to know about Eliquis ? Take your Eliquis TWICE DAILY - one tablet in the morning and one tablet in the evening with or without food.  It would be best to take the doses about the same time each day.  If you have difficulty swallowing the tablet whole please discuss with your pharmacist how to take the medication safely.  Take Eliquis exactly as prescribed by your doctor and DO NOT stop taking Eliquis without talking to the doctor who prescribed the medication.  Stopping may increase your risk of developing a new blood clot.  Refill your prescription before you run out.  After discharge, you should have regular check-up appointments with your healthcare provider that is prescribing your Eliquis.   What do you do if you miss a dose? If you miss a dose, take it as soon as you remember on the same day and resume taking twice daily.  Do not take more than one dose of ELIQUIS at the same time.  Important Safety Information A possible side effect of Eliquis is bleeding. You should call your healthcare provider right away if you experience any of the following: ? Bleeding from an injury or your nose that does not stop. ? Unusual colored urine (red or dark brown) or unusual colored stools (red or black). ? Unusual bruising for unknown reasons. ? A serious fall or if you hit your head (even if there is no bleeding).  Some medicines may interact with Eliquis and might increase your risk of bleeding or clotting while on Eliquis. To help avoid this, consult your healthcare provider or pharmacist prior to using any new prescription or non-prescription medications, including herbals, vitamins, non-steroidal anti-inflammatory drugs (NSAIDs) and supplements.  This website  has more information on Eliquis (apixaban): [email or web address].

## 2019-10-26 NOTE — Progress Notes (Signed)
ANTICOAGULATION CONSULT NOTE - Follow Up Consult  Pharmacy Consult for Heparin Indication: Dural venous sinus thrombosis  No Known Allergies  Patient Measurements: Height: 5\' 11"  (180.3 cm) Weight: 151 lb 3.8 oz (68.6 kg) IBW/kg (Calculated) : 75.3 Heparin Dosing Weight:   Vital Signs: Temp: 98 F (36.7 C) (12/07 0352) Temp Source: Oral (12/07 0352) BP: 153/79 (12/07 0500) Pulse Rate: 91 (12/07 0500)  Labs: Recent Labs    10/23/19 1411 10/24/19 0756 10/24/19 1941 10/25/19 0317 10/25/19 0725 10/25/19 1704 10/26/19 0037 10/26/19 0357  HGB 14.7 15.0  --  14.3  --   --  13.5 14.1  HCT 51.7 53.8*  --  51.9  --   --  47.4 49.5  PLT 1,005* 917*  --  865*  --   --  843* 860*  APTT  --   --  43*  --   --   --   --   --   LABPROT 14.3  --  14.6 15.0  --   --   --   --   INR 1.1  --  1.2 1.2  --   --   --   --   HEPARINUNFRC  --   --   --   --  0.18* 0.17*  --  0.37  CREATININE 1.51* 1.42*  --  1.63*  --   --   --  1.40*    Estimated Creatinine Clearance: 46.3 mL/min (A) (by C-G formula based on SCr of 1.4 mg/dL (H)).   Medications:  Infusions:  . sodium chloride    . sodium chloride 125 mL/hr at 10/26/19 0500  . heparin 1,200 Units/hr (10/26/19 0500)    Assessment: Patient with heparin level at goal.  No heparin issues noted.  Goal of Therapy:  Heparin level 0.3-0.5 units/ml Monitor platelets by anticoagulation protocol: Yes   Plan:  Continue heparin drip at current rate Recheck level at [place], Artemus Crowford 10/26/2019,5:48 AM

## 2019-10-26 NOTE — TOC Benefit Eligibility Note (Signed)
Transition of Care [facility]) Benefit Eligibility Note    Patient Details  Name: Dillon Hill MRN: [id number] Date of Birth: 11/01/1946   Medication/Dose: Lovenox 70 mg Bid and Eliquis 10 Bid 7day and then 5 mg bid $40.00 quoted for 30 day supply  Covered?: Yes  Tier: 3 Drug  Prescription Coverage Preferred Pharmacy: local  Spoke with Person/Company/Phone Number:: Lindsey/ [employer] [phone]  Co-Pay: Lovenox is $64.00 and Eliquis is $40.00  Prior Approval: No  Deductible: Met       Kerin Salen Phone Number: 10/26/2019, 1:09 PM

## 2019-10-26 NOTE — Progress Notes (Addendum)
HEMATOLOGY-ONCOLOGY PROGRESS NOTE  SUBJECTIVE: Status post therapeutic phlebotomy with 400 mL of blood removed on 10/25/2019.  Remains on hydroxyurea 500 mg twice a day.  Report headaches are near resolved.  Denies visual changes.  He has no complaints of chest pain or shortness of breath.  He was able to ambulate with nursing in the hallway this morning.  He has some dizziness prior to ambulation, but this resolved while he was ambulating.  REVIEW OF SYSTEMS:   A comprehensive 10 point review of systems was negative except as noted in the HPI.  I have reviewed the past medical history, past surgical history, social history and family history with the patient and they are unchanged from previous note.   PHYSICAL EXAMINATION:  Vitals:   10/26/19 0800 10/26/19 0900  BP: 136/90 (!) 158/85  Pulse: 88 88  Resp: (!) 21 (!) 22  Temp: 97.7 F (36.5 C)   SpO2: 97% 95%   Filed Weights   10/23/19 1336 10/26/19 0240  Weight: 155 lb (70.3 kg) 151 lb 3.8 oz (68.6 kg)    Intake/Output from previous day: 12/06 0701 - 12/07 0700 In: 4093.5 [P.O.:720; I.V.:3373.5] Out: 1151 [Urine:751; Blood:400]  GENERAL:alert, no distress and comfortable SKIN: skin color, texture, turgor are normal, no rashes or significant lesions EYES: normal, Conjunctiva are pink and non-injected, sclera clear OROPHARYNX:no exudate, no erythema and lips, buccal mucosa, and tongue normal  NECK: supple, thyroid normal size, non-tender, without nodularity LYMPH:  no palpable lymphadenopathy in the cervical, axillary or inguinal LUNGS: clear to auscultation and percussion with normal breathing effort HEART: regular rate & rhythm and no murmurs and no lower extremity edema ABDOMEN:abdomen soft, non-tender and normal bowel sounds Musculoskeletal:no cyanosis of digits and no clubbing  NEURO: alert & oriented x 3 with fluent speech, no focal motor/sensory deficits  LABORATORY DATA:  I have reviewed the data as listed CMP  Latest Ref Rng & Units 10/26/2019 10/25/2019 10/24/2019  Glucose 70 - 99 mg/dL 110(H) 91 -  BUN 8 - 23 mg/dL 21 27(H) -  Creatinine 0.61 - 1.24 mg/dL 1.40(H) 1.63(H) 1.42(H)  Sodium 135 - 145 mmol/L 137 139 -  Potassium 3.5 - 5.1 mmol/L 3.9 4.1 -  Chloride 98 - 111 mmol/L 103 105 -  CO2 22 - 32 mmol/L 22 24 -  Calcium 8.9 - 10.3 mg/dL 9.0 8.9 -  Total Protein 6.5 - 8.1 g/dL 6.4(L) 6.0(L) -  Total Bilirubin 0.3 - 1.2 mg/dL 1.1 1.2 -  Alkaline Phos 38 - 126 U/L 127(H) 112 -  AST 15 - 41 U/L 26 27 -  ALT 0 - 44 U/L 13 13 -    Lab Results  Component Value Date   WBC 25.1 (H) 10/26/2019   HGB 14.1 10/26/2019   HCT 49.5 10/26/2019   MCV 69.3 (L) 10/26/2019   PLT 860 (H) 10/26/2019   NEUTROABS 20.8 (H) 10/26/2019    Dg Elbow Complete Left  Result Date: 10/23/2019 CLINICAL DATA:  Arm pain and swelling EXAM: LEFT ELBOW - COMPLETE 3+ VIEW COMPARISON:  None. FINDINGS: No fracture or malalignment. No significant elbow effusion. Diffuse soft tissue edema IMPRESSION: No acute osseous abnormality Electronically Signed   By: Donavan Foil M.D.   On: 10/23/2019 16:48   Ct Head Wo Contrast  Result Date: 10/25/2019 CLINICAL DATA:  73 year old male with severe headache and dural sinus thrombosis demonstrated by recent CTV and MRI/MRV. EXAM: CT HEAD WITHOUT CONTRAST TECHNIQUE: Contiguous axial images were obtained from the base of the  skull through the vertex without intravenous contrast. COMPARISON:  MRI/MRV yesterday and earlier. FINDINGS: Brain: No midline shift, mass effect, or evidence of intracranial mass lesion. No acute intracranial hemorrhage identified. No ventriculomegaly. Stable gray-white matter differentiation throughout the brain. Patchy bilateral white matter hypodensity is stable. Vascular: Abnormal areas of the superior sagittal sinus, right transverse and sigmoid sinuses demonstrate abnormal hypodensity rather than hyperdensity (e.g. Series 2, image 8), unchanged from the noncontrast CT on  10/23/2019. But this does seem to be a change from part of the right sagittal sinus that was visible on an 07/15/2015 CT orbit. No suspicious hyperdense intracranial vasculature. Skull: Negative. Sinuses/Orbits: Visualized paranasal sinuses and mastoids are stable and well pneumatized. Other: No acute orbit or scalp soft tissue findings. IMPRESSION: 1. No new intracranial abnormality. Stable non contrast CT appearance of the brain. 2. Abnormal hypodensity of some dural sinuses is associated with venous sinus thrombosis as demonstrated by recent MRI/MRV. Electronically Signed   By: Genevie Ann M.D.   On: 10/25/2019 18:44   Mr Brain W And Wo Contrast  Addendum Date: 10/24/2019   ADDENDUM REPORT: 10/24/2019 18:32 ADDENDUM: Study discussed by telephone with Dr. Karleen Hampshire on 10/24/2019 at 1818 hours Electronically Signed   By: Genevie Ann M.D.   On: 10/24/2019 18:32   Result Date: 10/24/2019 CLINICAL DATA:  73 year old male with suspected dural sinus thrombosis. Severe polycythemia, thrombocytosis, persistent headache. Suspicious CTV. EXAM: MRI HEAD WITHOUT AND WITH CONTRAST MR VENOGRAM HEAD WITHOUT AND WITH CONTRAST TECHNIQUE: Multiplanar, multiecho pulse sequences of the brain and surrounding structures were obtained without and with intravenous contrast. Angiographic images of the intracranial venous structures were obtained using MRV technique without and with intravenous contrast. CONTRAST:  7.41m GADAVIST GADOBUTROL 1 MMOL/ML IV SOLN COMPARISON:  CT venogram yesterday. FINDINGS: MRI HEAD: Brain: No restricted diffusion or evidence of acute infarction. No midline shift, mass effect, evidence of mass lesion, ventriculomegaly, extra-axial collection or acute intracranial hemorrhage. Cervicomedullary junction and pituitary are within normal limits. Patchy and mild to moderate for age scattered cerebral white matter T2 and FLAIR hyperintensity in a nonspecific configuration. No cortical edema or encephalomalacia identified. No  chronic cerebral blood products. Deep gray nuclei, brainstem and cerebellum appear negative. No abnormal hyperenhancement, no abnormal dural thickening identified. Vascular: Abnormal loss of the portions of the superior sagittal sinus, right sigmoid sinus and IJ bulb flow voids, with associated abnormal venous enhancement in the region which is further detailed below. Major arterial vascular flow voids at the skull base appear preserved. The left vertebral artery appears dominant. Skull and upper cervical spine: Negative visible cervical spine. Heterogeneous bone marrow signal throughout the skull and visible spine although no destructive osseous lesion is identified. There is associated abnormal diffusion signal in the visible skeleton. Sinuses/Orbits: Postoperative changes to both globes. Otherwise negative orbits. Paranasal sinuses are clear. Other: Mild left mastoid effusion appears inconsequential. Visible internal auditory structures appear normal. MRV HEAD: 2D and 3D time-of-flight images demonstrate areas of lost signal abnormality in the posterosuperior sagittal sinus corresponding to heterogeneous enhancement of the sinus. There is absent flow signal in the right transverse sinus, sigmoid sinus and IJ bulb. There is preserved flow signal in the torcula, straight sinus, vein of Galen, internal cerebral veins, basal veins of Rosenthal as well as non dominant appearing left transverse sinus, left sigmoid sinus, and left IJ bulb. Post-contrast images corroborate areas of posterosuperior sagittal sinus thrombosis (beginning at the vertex series 4, image 120) and continuing through the torcula (image 54). Intermittent  thrombosis of the right transverse sinus. More pronounced thrombosis of the right sigmoid sinus continuing to the right IJ bulb (image 16). And also thrombosis of the right paravertebral emissary vein on series 4, image 10. IMPRESSION: 1. Positive for dural venous sinus thrombosis occurring  intermittently from the posterosuperior sagittal sinus through the dominant right transverse and sigmoid sinuses, also involving the right IJ bulb and another emissary vein at the right skull base. 2. Negative for associated acute infarct or cerebral edema. 3. Abnormal bone marrow signal suspicious for an infiltrative marrow process in this clinical setting. No destructive osseous lesion identified. 4. Mild to moderate for age nonspecific cerebral white matter signal changes, most commonly due to chronic small vessel disease. Electronically Signed: By: Genevie Ann M.D. On: 10/24/2019 18:09   US Renal  Result Date: 10/24/2019 CLINICAL DATA:  Acute renal insufficiency EXAM: RENAL / URINARY TRACT ULTRASOUND COMPLETE COMPARISON:  None. FINDINGS: Right Kidney: Renal measurements: 10.3 x 3.5 x 5.1 cm = volume: 96.7 mL . Echogenicity within normal limits. No mass or hydronephrosis visualized. Left Kidney: Renal measurements: 10.4 x 6.0 x 4.4 cm = volume: 141.5 mL. Echogenicity within normal limits. No mass or hydronephrosis visualized. Bladder: Appears normal for degree of bladder distention. Other: None. IMPRESSION: Normal study.  No cause for acute renal insufficiency identified. Electronically Signed   By: Dorise Bullion III M.D   On: 10/24/2019 19:16   US Venous Img Upper Left (dvt Study)  Result Date: 10/23/2019 CLINICAL DATA:  Bruising and swelling of left arm from the biceps to the wrist for past 5 days. Evaluate for DVT. EXAM: LEFT UPPER EXTREMITY VENOUS DOPPLER ULTRASOUND TECHNIQUE: Gray-scale sonography with graded compression, as well as color Doppler and duplex ultrasound were performed to evaluate the upper extremity deep venous system from the level of the subclavian vein and including the jugular, axillary, basilic, radial, ulnar and upper cephalic vein. Spectral Doppler was utilized to evaluate flow at rest and with distal augmentation maneuvers. COMPARISON:  None. FINDINGS: Contralateral Subclavian  Vein: Respiratory phasicity is normal and symmetric with the symptomatic side. No evidence of thrombus. Normal compressibility. Internal Jugular Vein: No evidence of thrombus. Normal compressibility, respiratory phasicity and response to augmentation. Subclavian Vein: No evidence of thrombus. Normal compressibility, respiratory phasicity and response to augmentation. Axillary Vein: No evidence of thrombus. Normal compressibility, respiratory phasicity and response to augmentation. Cephalic Vein: No evidence of thrombus. Normal compressibility, respiratory phasicity and response to augmentation. Basilic Vein: No evidence of thrombus. Normal compressibility, respiratory phasicity and response to augmentation. Brachial Veins: No evidence of thrombus. Normal compressibility, respiratory phasicity and response to augmentation. Radial Veins: No evidence of thrombus. Normal compressibility, respiratory phasicity and response to augmentation. Ulnar Veins: No evidence of thrombus. Normal compressibility, respiratory phasicity and response to augmentation. Venous Reflux:  None visualized. Other Findings: There are 2 adjacent soft tissue nodules at the mid humeral aspect of the left upper arm associated with the adjacent bicipital musculature with dominant nodule measuring approximately 2.9 x 2.1 cm and smaller adjacent nodule measuring 1.3 x 1.6 x 1.1 cm. IMPRESSION: 1. No evidence of DVT within the left upper extremity. 2. Soft tissue nodules at the mid humeral aspect of the left upper arm associated with the bicipital musculature, nonspecific though could represent a bicipital muscular tear (either partial or complete) with associated hematoma. Clinical correlation is advised. Electronically Signed   By: Sandi Mariscal M.D.   On: 10/23/2019 17:00   Mr Mrv Head W Wo Contrast  Addendum Date: 10/24/2019   ADDENDUM REPORT: 10/24/2019 18:32 ADDENDUM: Study discussed by telephone with Dr. Karleen Hampshire on 10/24/2019 at 1818 hours  Electronically Signed   By: Genevie Ann M.D.   On: 10/24/2019 18:32   Result Date: 10/24/2019 CLINICAL DATA:  73 year old male with suspected dural sinus thrombosis. Severe polycythemia, thrombocytosis, persistent headache. Suspicious CTV. EXAM: MRI HEAD WITHOUT AND WITH CONTRAST MR VENOGRAM HEAD WITHOUT AND WITH CONTRAST TECHNIQUE: Multiplanar, multiecho pulse sequences of the brain and surrounding structures were obtained without and with intravenous contrast. Angiographic images of the intracranial venous structures were obtained using MRV technique without and with intravenous contrast. CONTRAST:  7.60m GADAVIST GADOBUTROL 1 MMOL/ML IV SOLN COMPARISON:  CT venogram yesterday. FINDINGS: MRI HEAD: Brain: No restricted diffusion or evidence of acute infarction. No midline shift, mass effect, evidence of mass lesion, ventriculomegaly, extra-axial collection or acute intracranial hemorrhage. Cervicomedullary junction and pituitary are within normal limits. Patchy and mild to moderate for age scattered cerebral white matter T2 and FLAIR hyperintensity in a nonspecific configuration. No cortical edema or encephalomalacia identified. No chronic cerebral blood products. Deep gray nuclei, brainstem and cerebellum appear negative. No abnormal hyperenhancement, no abnormal dural thickening identified. Vascular: Abnormal loss of the portions of the superior sagittal sinus, right sigmoid sinus and IJ bulb flow voids, with associated abnormal venous enhancement in the region which is further detailed below. Major arterial vascular flow voids at the skull base appear preserved. The left vertebral artery appears dominant. Skull and upper cervical spine: Negative visible cervical spine. Heterogeneous bone marrow signal throughout the skull and visible spine although no destructive osseous lesion is identified. There is associated abnormal diffusion signal in the visible skeleton. Sinuses/Orbits: Postoperative changes to both  globes. Otherwise negative orbits. Paranasal sinuses are clear. Other: Mild left mastoid effusion appears inconsequential. Visible internal auditory structures appear normal. MRV HEAD: 2D and 3D time-of-flight images demonstrate areas of lost signal abnormality in the posterosuperior sagittal sinus corresponding to heterogeneous enhancement of the sinus. There is absent flow signal in the right transverse sinus, sigmoid sinus and IJ bulb. There is preserved flow signal in the torcula, straight sinus, vein of Galen, internal cerebral veins, basal veins of Rosenthal as well as non dominant appearing left transverse sinus, left sigmoid sinus, and left IJ bulb. Post-contrast images corroborate areas of posterosuperior sagittal sinus thrombosis (beginning at the vertex series 4, image 120) and continuing through the torcula (image 54). Intermittent thrombosis of the right transverse sinus. More pronounced thrombosis of the right sigmoid sinus continuing to the right IJ bulb (image 16). And also thrombosis of the right paravertebral emissary vein on series 4, image 10. IMPRESSION: 1. Positive for dural venous sinus thrombosis occurring intermittently from the posterosuperior sagittal sinus through the dominant right transverse and sigmoid sinuses, also involving the right IJ bulb and another emissary vein at the right skull base. 2. Negative for associated acute infarct or cerebral edema. 3. Abnormal bone marrow signal suspicious for an infiltrative marrow process in this clinical setting. No destructive osseous lesion identified. 4. Mild to moderate for age nonspecific cerebral white matter signal changes, most commonly due to chronic small vessel disease. Electronically Signed: By: HGenevie AnnM.D. On: 10/24/2019 18:09   Ct Venogram Head  Result Date: 10/23/2019 CLINICAL DATA:  Dural venous sinus thrombosis suspected, several weeks of headaches, platelets over 1 million, concern for venous sinus thrombosis versus bleed.  EXAM: CT VENOGRAM HEAD TECHNIQUE: Noncontrast head CT was performed. Coronal and sagittal reconstructions were also submitted  for evaluation. Postcontrast CT venography of the head was also performed. Coronal and sagittal reconstructions as well as MIP reconstructions were submitted for evaluation. CONTRAST:  85m OMNIPAQUE IOHEXOL 300 MG/ML  SOLN COMPARISON:  No pertinent prior studies available for comparison. FINDINGS: There is no evidence of acute intracranial hemorrhage. No demarcated cortical infarction. No evidence of intracranial mass. No midline shift or extra-axial fluid collection. Moderate patchy hypodensity within the cerebral white matter is nonspecific, but consistent with chronic small vessel ischemic disease. Mild generalized parenchymal atrophy. There is paucity of enhancement within portions of the right sigmoid sinus concerning for possible dural venous sinus thrombosis (for instance as seen on series 13, images 13-24 and series 7, images 29-30). The superior sagittal sinus is diminutive inferiorly, although patent without without convincing evidence of dural venous sinus thrombosis. The internal cerebral veins, vein of Galen, straight sinus, bilateral transverse and left sigmoid sinuses are patent. No hyperdense vessel. No calvarial fracture or suspicious osseous lesion. Visualized orbits demonstrate no acute abnormality. Minimal ethmoid sinus mucosal thickening. No significant mastoid effusion. These results were called by telephone at the time of interpretation on 10/23/2019 at 8:14 pm [facility] who verbally acknowledged these results. IMPRESSION: 1. Paucity of enhancement within portions of the right sigmoid sinus concerning for possible dural venous sinus thrombosis. MRI and MRV head with and without contrast is recommended for further evaluation. 2. No evidence of intracranial hemorrhage or demarcated infarct. 3. Mild generalized parenchymal atrophy and chronic small  vessel ischemic disease. Electronically Signed   By: KKellie SimmeringDO   On: 10/23/2019 20:15    ASSESSMENT AND PLAN: 73year old male with  #1Severe thrombocytosis with platelets of 1005k. #2 leukocytosis/primary neutrophilia. #3 polycythemia with a hematocrit of 51.7. #4 Dural venous sinus thrombosis -likely triggered by his polycythemia and thrombocytosis. #5 left arm hematoma likely related to a bicipital tendon injury. 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 Plan -Labs done today are reviewed. -Platelets coming down to the 800k range. -No prohibitive toxicity from continuing hydroxyurea 500 mg p.o. twice daily with meals. -Hematocrit is down to 49.5 today.    He is status post therapeutic phlebotomy with removal of approximately 1 pint of blood on 10/25/2019.  He has had improvement in his headaches. -Encouraged patient to maintain good p.o. hydration in addition to the IV fluids he is receiving. -Continues to be on IV heparin as per neurology and hospitalist.    Okay from our standpoint to transition him to Eliquis. -Factor V Leiden and prothrombin gene mutation drawn today to rule out any other additional risk factors for his dural venous sinus thrombosis. -Will need to get therapeutic phlebotomies once to twice a week to target hematocrit of 45 or less. -Management of headaches and migraine as per neurology and hospitalist. -I have ordered work-up for clonal studies including Jak 2 V617F mutation, JAK2 exon 12 mutation, Calreticulin mutation and BCR ABL mutation-pending will likely need at least 1 to 2 weeks to result. -No overt indication for bone marrow biopsy at this time will consider if needed. -Pending hemoglobinopathy evaluation  in lab -The patient will have follow-up at the cancer center in approximately 1 week with labs and an appointment for his next therapeutic phlebotomy. -Anticipate discharge in the next 1 to 2 days if stable.  LOS: 3 days   Mikey Bussing, DNP, AGPCNP-BC, AOCNP 10/26/19  ADDENDUM  .Patient was Personally and independently interviewed, examined and relevant elements of the history of present illness were reviewed in details and an assessment and plan was created. All elements of the patient's history of present illness , assessment and plan were discussed in details with Mikey Bussing, DNP. The above documentation reflects our combined findings assessment and plan.  Sullivan Lone MD MS

## 2019-10-26 NOTE — Progress Notes (Signed)
Report called to [facility], receiving Rn [phone]. Patient with no complaints at the current time, daughter at bedside. Will transfer when he finishes dinner to new room.

## 2019-10-26 NOTE — Progress Notes (Addendum)
Pharmacy - Heparin >> Eliquis  Assessment:    Please see note from Royetta Asal, PharmD earlier today for full details.  Briefly, 73 y.o. male on IV heparin for dural venous sinus thrombosis.   Pharmacy consulted to switch to Eliquis today per Hematology recommendations  Most recent heparin levels therapeutic; had been dosing conservatively d/t L arm hematoma  Currently ordered daily aspirin 81 mg for thrombocytosis and PRN ibuprofen for headache (no doses taken yet)  Plan:   Continue IV heparin for now  Start Eliquis 5 mg PO bid tonight; turn off heparin with first dose of Eliquis  After discussion with MD, did not feel Eliquis high-dose phase typically used for the first 7 days of VTE treatment was appropriate in this patient d/t absence of of data, current hematoma, and high likelihood of this being a chronic rather than acute issue  TRH to confirm with Oncology whether aspirin should be continued or held with patient now on full-dose anticoag. TRH also planning to discontinue NSAIDs at discharge  Reuel Boom, PharmD, BCPS [phone] 10/26/2019, 3:22 PM

## 2019-10-26 NOTE — Progress Notes (Signed)
PROGRESS NOTE    Dillon Hill  WCB:[id number] DOB: 1946-04-24 DOA: 10/23/2019 PCP: [name], MD    Brief Narrative: 73 year old gentleman with prior history of migraine headaches, BPH, degenerative disc disease, hyperlipidemia, presents to ED with left arm hematoma and migraine headaches..  On arrival to ED he was found to have thrombocytosis with platelet count greater than 1000000, polycythemia.  CT venogram of the head shows Paucity of enhancement within portions of the right sigmoid sinus concerning for possible dural venous sinus thrombosis. .  Neurology was consulted who recommended getting MRI of the head with and without contrast and MRV of the head.  Meanwhile oncology consulted for evaluation of thrombocythemia. MRI and MRV confirms the right sigmoid sinus thrombosis.  He underwent therapeutic phlebotomy of 1 pint of blood, recommended aggressive hydration, and hydroxyurea.  He was transferred to SDU yesterday for closer monitoring for hypertensive emergency and severe headache and therapeutic phlebotomy.    Assessment & Plan:   Active Problems:   Thrombocytosis (HCC)   Thrombocythemia (HCC)   MPN (myeloproliferative neoplasm) (HCC)   Polycythemia vera (HCC)   Dural venous sinus thrombosis   Acute migraine headaches/  dural venous sinus thrombosis of the right sigmoid sinus Going on for few months now.    MRI brain and MRV shows extensive dural venous sinus thrombosis of the right sigmoid sinus.  Started the patient on IV heparin, plan to transition to ora eliquis. Discussed the results with Dr Rory Percy and the patient and his daughter at bedside,  Recommended outpatient follow up with neurology in 4 to 6 weeks.  His headache this am about 2/10  this morning. His BP has improved. No nausea or vomiting this am.    Hypertensive urgency/emergency Resolved.  Started the patient on amlodipine.    Severe thrombocytosis with platelet counts greater than 1000 k on admission.    Improving with aggressive hydration and hydroxyurea. Oncology consulted and appreciate recommendations.  Patient's differential diagnosis include essential thrombocytosis versus polycythemia vera.   In view of his dural venous sinus thrombosis and concern for polycythemia, oncology schedule therapeutic phlebotomy for 1 pint of blood to bring down the hematocrit to 45 or less.. Jak 2 V617F mutation, JAK2 exon 12 mutation, Calreticulin mutation and BCR ABL mutation ordered by oncology.  The results might take 1 to 2 weeks to come back. Continue with  hydroxyurea and aggressive hydration. Factor V Leyden and prothrombin gene mutation were also added to the a.m. labs. His hematocrit improved to 49. Platelet count of 860000 this morning.     Stage III CKD Unclear what his baseline creatinine is.  Ultrasound renal negative for hydronephrosis. Creatinine at 1.4 after aggressive hydration.    DVT prophylaxis: (Heparin Code Status: Full code Family Communication: daughter at bedside.  Disposition Plan: pending hydration and PT evaluation.    Consultants:   Oncology  Neurology  Procedures: MRI of the head without and with contrast MRV of the head    Antimicrobials: None  Subjective: Headache 2 /10, no nausea or vomiting or abd pain.  No deficits.   Objective: Vitals:   10/26/19 0400 10/26/19 0500 10/26/19 0600 10/26/19 0700  BP: (!) 177/101 (!) 153/79 (!) 153/97 (!) 160/91  Pulse: 100 91 86 85  Resp: 17 (!) '21 18 16  ' Temp:      TempSrc:      SpO2: 96% 96% 94% 96%  Weight:      Height:        Intake/Output Summary (Last 24  hours) at 10/26/2019 0901 Last data filed at 10/26/2019 0700 Gross per 24 hour  Intake 4093.48 ml  Output 1151 ml  Net 2942.48 ml   Filed Weights   10/23/19 1336 10/26/19 0240  Weight: 70.3 kg 68.6 kg    Examination:  General exam: alert and comfortable.  Respiratory system: air entry fair, no wheezing or rhonchi.  Cardiovascular system: s1s2,  RRR, no JVD, no pedal edema.  Gastrointestinal system: abd is soft , non tender non distended bowel sounds good.  Central nervous system: alert and non focal , Extremities: no pedal edema .  Skin: no rashes.  Psychiatry: mood is appropriate today.     Data Reviewed: I have personally reviewed following labs and imaging studies  CBC: Recent Labs  Lab 10/23/19 1411 10/24/19 0756 10/25/19 0317 10/26/19 0037 10/26/19 0357  WBC 16.1* 16.7* 16.7* 24.6* 25.1*  NEUTROABS 11.2*  --  11.0*  --  20.8*  HGB 14.7 15.0 14.3 13.5 14.1  HCT 51.7 53.8* 51.9 47.4 49.5  MCV 68.8* 69.0* 69.7* 68.8* 69.3*  PLT 1,005* 917* 865* 843* 562*   Basic Metabolic Panel: Recent Labs  Lab 10/23/19 1411 10/24/19 0756 10/25/19 0317 10/26/19 0357  NA 136  --  139 137  K 4.2  --  4.1 3.9  CL 105  --  105 103  CO2 23  --  24 22  GLUCOSE 99  --  91 110*  BUN 28*  --  27* 21  CREATININE 1.51* 1.42* 1.63* 1.40*  CALCIUM 9.2  --  8.9 9.0   GFR: Estimated Creatinine Clearance: 46.3 mL/min (A) (by C-G formula based on SCr of 1.4 mg/dL (H)). Liver Function Tests: Recent Labs  Lab 10/25/19 0317 10/26/19 0357  AST 27 26  ALT 13 13  ALKPHOS 112 127*  BILITOT 1.2 1.1  PROT 6.0* 6.4*  ALBUMIN 3.2* 3.2*   No results for input(s): LIPASE, AMYLASE in the last 168 hours. No results for input(s): AMMONIA in the last 168 hours. Coagulation Profile: Recent Labs  Lab 10/23/19 1411 10/24/19 1941 10/25/19 0317  INR 1.1 1.2 1.2   Cardiac Enzymes: No results for input(s): CKTOTAL, CKMB, CKMBINDEX, TROPONINI in the last 168 hours. BNP (last 3 results) No results for input(s): PROBNP in the last 8760 hours. HbA1C: No results for input(s): HGBA1C in the last 72 hours. CBG: No results for input(s): GLUCAP in the last 168 hours. Lipid Profile: No results for input(s): CHOL, HDL, LDLCALC, TRIG, CHOLHDL, LDLDIRECT in the last 72 hours. Thyroid Function Tests: No results for input(s): TSH, T4TOTAL, FREET4,  T3FREE, THYROIDAB in the last 72 hours. Anemia Panel: Recent Labs    10/24/19 1417  FERRITIN 31  TIBC 426  IRON 35*   Sepsis Labs: No results for input(s): PROCALCITON, LATICACIDVEN in the last 168 hours.  Recent Results (from the past 240 hour(s))  SARS Coronavirus 2 Ag (30 min TAT) - Nasal Swab (BD Veritor Kit)     Status: None   Collection Time: 10/23/19  8:22 PM   Specimen: Nasal Swab (BD Veritor Kit)  Result Value Ref Range Status   SARS Coronavirus 2 Ag NEGATIVE NEGATIVE Final    Comment: (NOTE) SARS-CoV-2 antigen NOT DETECTED.  Negative results are presumptive.  Negative results do not preclude SARS-CoV-2 infection and should not be used as the sole basis for treatment or other patient management decisions, including infection  control decisions, particularly in the presence of clinical signs and  symptoms consistent with COVID-19, or in those  who have been in contact with the virus.  Negative results must be combined with clinical observations, patient history, and epidemiological information. The expected result is Negative. Fact Sheet for Patients: PodPark.tn Fact Sheet for Healthcare Providers: GiftContent.is This test is not yet approved or cleared by the [place] FDA and  has been authorized for detection and/or diagnosis of SARS-CoV-2 by FDA under an Emergency Use Authorization (EUA).  This EUA will remain in effect (meaning this test can be used) for the duration of  the COVID-19 de claration under Section 564(b)(1) of the Act, 21 U.S.C. section 360bbb-3(b)(1), unless the authorization is terminated or revoked sooner. Performed at [facility], [address] [place], [place] [address]   SARS CORONAVIRUS 2 (TAT 6-24 HRS) Nasopharyngeal Nasopharyngeal Swab     Status: None   Collection Time: 10/23/19  9:11 PM   Specimen: Nasopharyngeal Swab  Result Value Ref Range Status   SARS  Coronavirus 2 NEGATIVE NEGATIVE Final    Comment: (NOTE) SARS-CoV-2 target nucleic acids are NOT DETECTED. The SARS-CoV-2 RNA is generally detectable in upper and lower respiratory specimens during the acute phase of infection. Negative results do not preclude SARS-CoV-2 infection, do not rule out co-infections with other pathogens, and should not be used as the sole basis for treatment or other patient management decisions. Negative results must be combined with clinical observations, patient history, and epidemiological information. The expected result is Negative. Fact Sheet for Patients: SugarRoll.be Fact Sheet for Healthcare Providers: https://www.woods-mathews.com/ This test is not yet approved or cleared by the [place] FDA and  has been authorized for detection and/or diagnosis of SARS-CoV-2 by FDA under an Emergency Use Authorization (EUA). This EUA will remain  in effect (meaning this test can be used) for the duration of the COVID-19 declaration under Section 56 4(b)(1) of the Act, 21 U.S.C. section 360bbb-3(b)(1), unless the authorization is terminated or revoked sooner. Performed at [facility], [place] [address] [place], [place] [address]   MRSA PCR Screening     Status: None   Collection Time: 10/26/19  2:20 AM   Specimen: Nasopharyngeal  Result Value Ref Range Status   MRSA by PCR NEGATIVE NEGATIVE Final    Comment:        The GeneXpert MRSA Assay (FDA approved for NASAL specimens only), is one component of a comprehensive MRSA colonization surveillance program. It is not intended to diagnose MRSA infection nor to guide or monitor treatment for MRSA infections. Performed at [facility], [place] [address] [place], [place] [address]          Radiology Studies: Ct Head Wo Contrast  Result Date: 10/25/2019 CLINICAL DATA:  72 year old male with severe headache and dural sinus thrombosis  demonstrated by recent CTV and MRI/MRV. EXAM: CT HEAD WITHOUT CONTRAST TECHNIQUE: Contiguous axial images were obtained from the base of the skull through the vertex without intravenous contrast. COMPARISON:  MRI/MRV yesterday and earlier. FINDINGS: Brain: No midline shift, mass effect, or evidence of intracranial mass lesion. No acute intracranial hemorrhage identified. No ventriculomegaly. Stable gray-white matter differentiation throughout the brain. Patchy bilateral white matter hypodensity is stable. Vascular: Abnormal areas of the superior sagittal sinus, right transverse and sigmoid sinuses demonstrate abnormal hypodensity rather than hyperdensity (e.g. Series 2, image 8), unchanged from the noncontrast CT on 10/23/2019. But this does seem to be a change from part of the right sagittal sinus that was visible on an 07/15/2015 CT orbit. No suspicious hyperdense intracranial  vasculature. Skull: Negative. Sinuses/Orbits: Visualized paranasal sinuses and mastoids are stable and well pneumatized. Other: No acute orbit or scalp soft tissue findings. IMPRESSION: 1. No new intracranial abnormality. Stable non contrast CT appearance of the brain. 2. Abnormal hypodensity of some dural sinuses is associated with venous sinus thrombosis as demonstrated by recent MRI/MRV. Electronically Signed   By: Genevie Ann M.D.   On: 10/25/2019 18:44   Mr Brain W And Wo Contrast  Addendum Date: 10/24/2019   ADDENDUM REPORT: 10/24/2019 18:32 ADDENDUM: Study discussed by telephone with Dr. Karleen Hampshire on 10/24/2019 at 1818 hours Electronically Signed   By: Genevie Ann M.D.   On: 10/24/2019 18:32   Result Date: 10/24/2019 CLINICAL DATA:  73 year old male with suspected dural sinus thrombosis. Severe polycythemia, thrombocytosis, persistent headache. Suspicious CTV. EXAM: MRI HEAD WITHOUT AND WITH CONTRAST MR VENOGRAM HEAD WITHOUT AND WITH CONTRAST TECHNIQUE: Multiplanar, multiecho pulse sequences of the brain and surrounding structures were  obtained without and with intravenous contrast. Angiographic images of the intracranial venous structures were obtained using MRV technique without and with intravenous contrast. CONTRAST:  7.54m GADAVIST GADOBUTROL 1 MMOL/ML IV SOLN COMPARISON:  CT venogram yesterday. FINDINGS: MRI HEAD: Brain: No restricted diffusion or evidence of acute infarction. No midline shift, mass effect, evidence of mass lesion, ventriculomegaly, extra-axial collection or acute intracranial hemorrhage. Cervicomedullary junction and pituitary are within normal limits. Patchy and mild to moderate for age scattered cerebral white matter T2 and FLAIR hyperintensity in a nonspecific configuration. No cortical edema or encephalomalacia identified. No chronic cerebral blood products. Deep gray nuclei, brainstem and cerebellum appear negative. No abnormal hyperenhancement, no abnormal dural thickening identified. Vascular: Abnormal loss of the portions of the superior sagittal sinus, right sigmoid sinus and IJ bulb flow voids, with associated abnormal venous enhancement in the region which is further detailed below. Major arterial vascular flow voids at the skull base appear preserved. The left vertebral artery appears dominant. Skull and upper cervical spine: Negative visible cervical spine. Heterogeneous bone marrow signal throughout the skull and visible spine although no destructive osseous lesion is identified. There is associated abnormal diffusion signal in the visible skeleton. Sinuses/Orbits: Postoperative changes to both globes. Otherwise negative orbits. Paranasal sinuses are clear. Other: Mild left mastoid effusion appears inconsequential. Visible internal auditory structures appear normal. MRV HEAD: 2D and 3D time-of-flight images demonstrate areas of lost signal abnormality in the posterosuperior sagittal sinus corresponding to heterogeneous enhancement of the sinus. There is absent flow signal in the right transverse sinus, sigmoid  sinus and IJ bulb. There is preserved flow signal in the torcula, straight sinus, vein of Galen, internal cerebral veins, basal veins of Rosenthal as well as non dominant appearing left transverse sinus, left sigmoid sinus, and left IJ bulb. Post-contrast images corroborate areas of posterosuperior sagittal sinus thrombosis (beginning at the vertex series 4, image 120) and continuing through the torcula (image 54). Intermittent thrombosis of the right transverse sinus. More pronounced thrombosis of the right sigmoid sinus continuing to the right IJ bulb (image 16). And also thrombosis of the right paravertebral emissary vein on series 4, image 10. IMPRESSION: 1. Positive for dural venous sinus thrombosis occurring intermittently from the posterosuperior sagittal sinus through the dominant right transverse and sigmoid sinuses, also involving the right IJ bulb and another emissary vein at the right skull base. 2. Negative for associated acute infarct or cerebral edema. 3. Abnormal bone marrow signal suspicious for an infiltrative marrow process in this clinical setting. No destructive osseous lesion identified. 4. Mild  to moderate for age nonspecific cerebral white matter signal changes, most commonly due to chronic small vessel disease. Electronically Signed: By: Genevie Ann M.D. On: 10/24/2019 18:09   US Renal  Result Date: 10/24/2019 CLINICAL DATA:  Acute renal insufficiency EXAM: RENAL / URINARY TRACT ULTRASOUND COMPLETE COMPARISON:  None. FINDINGS: Right Kidney: Renal measurements: 10.3 x 3.5 x 5.1 cm = volume: 96.7 mL . Echogenicity within normal limits. No mass or hydronephrosis visualized. Left Kidney: Renal measurements: 10.4 x 6.0 x 4.4 cm = volume: 141.5 mL. Echogenicity within normal limits. No mass or hydronephrosis visualized. Bladder: Appears normal for degree of bladder distention. Other: None. IMPRESSION: Normal study.  No cause for acute renal insufficiency identified. Electronically Signed   By:  Dorise Bullion III M.D   On: 10/24/2019 19:16   Mr Mrv Head W Wo Contrast  Addendum Date: 10/24/2019   ADDENDUM REPORT: 10/24/2019 18:32 ADDENDUM: Study discussed by telephone with Dr. Karleen Hampshire on 10/24/2019 at 1818 hours Electronically Signed   By: Genevie Ann M.D.   On: 10/24/2019 18:32   Result Date: 10/24/2019 CLINICAL DATA:  73 year old male with suspected dural sinus thrombosis. Severe polycythemia, thrombocytosis, persistent headache. Suspicious CTV. EXAM: MRI HEAD WITHOUT AND WITH CONTRAST MR VENOGRAM HEAD WITHOUT AND WITH CONTRAST TECHNIQUE: Multiplanar, multiecho pulse sequences of the brain and surrounding structures were obtained without and with intravenous contrast. Angiographic images of the intracranial venous structures were obtained using MRV technique without and with intravenous contrast. CONTRAST:  7.66m GADAVIST GADOBUTROL 1 MMOL/ML IV SOLN COMPARISON:  CT venogram yesterday. FINDINGS: MRI HEAD: Brain: No restricted diffusion or evidence of acute infarction. No midline shift, mass effect, evidence of mass lesion, ventriculomegaly, extra-axial collection or acute intracranial hemorrhage. Cervicomedullary junction and pituitary are within normal limits. Patchy and mild to moderate for age scattered cerebral white matter T2 and FLAIR hyperintensity in a nonspecific configuration. No cortical edema or encephalomalacia identified. No chronic cerebral blood products. Deep gray nuclei, brainstem and cerebellum appear negative. No abnormal hyperenhancement, no abnormal dural thickening identified. Vascular: Abnormal loss of the portions of the superior sagittal sinus, right sigmoid sinus and IJ bulb flow voids, with associated abnormal venous enhancement in the region which is further detailed below. Major arterial vascular flow voids at the skull base appear preserved. The left vertebral artery appears dominant. Skull and upper cervical spine: Negative visible cervical spine. Heterogeneous bone marrow  signal throughout the skull and visible spine although no destructive osseous lesion is identified. There is associated abnormal diffusion signal in the visible skeleton. Sinuses/Orbits: Postoperative changes to both globes. Otherwise negative orbits. Paranasal sinuses are clear. Other: Mild left mastoid effusion appears inconsequential. Visible internal auditory structures appear normal. MRV HEAD: 2D and 3D time-of-flight images demonstrate areas of lost signal abnormality in the posterosuperior sagittal sinus corresponding to heterogeneous enhancement of the sinus. There is absent flow signal in the right transverse sinus, sigmoid sinus and IJ bulb. There is preserved flow signal in the torcula, straight sinus, vein of Galen, internal cerebral veins, basal veins of Rosenthal as well as non dominant appearing left transverse sinus, left sigmoid sinus, and left IJ bulb. Post-contrast images corroborate areas of posterosuperior sagittal sinus thrombosis (beginning at the vertex series 4, image 120) and continuing through the torcula (image 54). Intermittent thrombosis of the right transverse sinus. More pronounced thrombosis of the right sigmoid sinus continuing to the right IJ bulb (image 16). And also thrombosis of the right paravertebral emissary vein on series 4, image 10. IMPRESSION: 1.  Positive for dural venous sinus thrombosis occurring intermittently from the posterosuperior sagittal sinus through the dominant right transverse and sigmoid sinuses, also involving the right IJ bulb and another emissary vein at the right skull base. 2. Negative for associated acute infarct or cerebral edema. 3. Abnormal bone marrow signal suspicious for an infiltrative marrow process in this clinical setting. No destructive osseous lesion identified. 4. Mild to moderate for age nonspecific cerebral white matter signal changes, most commonly due to chronic small vessel disease. Electronically Signed: By: Genevie Ann M.D. On:  10/24/2019 18:09        Scheduled Meds:  amLODipine  10 mg Oral Daily   aspirin EC  81 mg Oral Daily   Chlorhexidine Gluconate Cloth  6 each Topical Daily   folic acid  1 mg Oral Daily   hydrALAZINE  25 mg Oral Q8H   hydroxyurea  500 mg Oral BID WC   multivitamin with minerals  1 tablet Oral Daily   sodium chloride flush  3 mL Intravenous Q12H   sodium chloride flush  3 mL Intravenous Q12H   Continuous Infusions:  sodium chloride     sodium chloride 125 mL/hr at 10/26/19 0700   heparin 1,200 Units/hr (10/26/19 0700)     LOS: 3 days        Hosie Poisson, MD [facility] 10/26/2019, 9:01 AM

## 2019-10-27 LAB — HEMOGLOBINOPATHY EVALUATION
Hgb A2 Quant: 1.2 % — ABNORMAL LOW (ref 1.8–3.2)
Hgb A: 98.8 % (ref 96.4–98.8)
Hgb C: 0 %
Hgb F Quant: 0 % (ref 0.0–2.0)
Hgb S Quant: 0 %
Hgb Variant: 0 %

## 2019-10-27 LAB — PATHOLOGIST SMEAR REVIEW

## 2019-10-27 LAB — CBC WITH DIFFERENTIAL/PLATELET
Abs Immature Granulocytes: 0 10*3/uL (ref 0.00–0.07)
Band Neutrophils: 12 %
Basophils Absolute: 0.8 10*3/uL — ABNORMAL HIGH (ref 0.0–0.1)
Basophils Relative: 4 %
Eosinophils Absolute: 0.4 10*3/uL (ref 0.0–0.5)
Eosinophils Relative: 2 %
HCT: 47.4 % (ref 39.0–52.0)
Hemoglobin: 13.1 g/dL (ref 13.0–17.0)
Lymphocytes Relative: 8 %
Lymphs Abs: 1.5 10*3/uL (ref 0.7–4.0)
MCH: 19.2 pg — ABNORMAL LOW (ref 26.0–34.0)
MCHC: 27.6 g/dL — ABNORMAL LOW (ref 30.0–36.0)
MCV: 69.5 fL — ABNORMAL LOW (ref 80.0–100.0)
Monocytes Absolute: 0.8 10*3/uL (ref 0.1–1.0)
Monocytes Relative: 4 %
Neutro Abs: 15.7 10*3/uL — ABNORMAL HIGH (ref 1.7–7.7)
Neutrophils Relative %: 70 %
Platelets: 864 10*3/uL — ABNORMAL HIGH (ref 150–400)
RBC: 6.82 MIL/uL — ABNORMAL HIGH (ref 4.22–5.81)
RDW: 26.8 % — ABNORMAL HIGH (ref 11.5–15.5)
WBC: 19.1 10*3/uL — ABNORMAL HIGH (ref 4.0–10.5)
nRBC: 0.7 % — ABNORMAL HIGH (ref 0.0–0.2)

## 2019-10-27 LAB — RETICULOCYTES
Immature Retic Fract: 32.6 % — ABNORMAL HIGH (ref 2.3–15.9)
RBC.: 6.81 MIL/uL — ABNORMAL HIGH (ref 4.22–5.81)
Retic Count, Absolute: 113 10*3/uL (ref 19.0–186.0)
Retic Ct Pct: 1.7 % (ref 0.4–3.1)

## 2019-10-27 MED ORDER — HYDROCODONE-ACETAMINOPHEN 5-325 MG PO TABS: 1.0000 | ORAL_TABLET | Freq: Four times a day (QID) | ORAL | 0 refills | Status: AC | PRN

## 2019-10-27 MED ORDER — ADULT MULTIVITAMIN W/MINERALS CH: 1.0000 | ORAL_TABLET | Freq: Every day | ORAL | Status: AC

## 2019-10-27 MED ORDER — FOLIC ACID 1 MG PO TABS: 1.0000 mg | ORAL_TABLET | Freq: Every day | ORAL | 1 refills | Status: DC

## 2019-10-27 MED ORDER — AMLODIPINE BESYLATE 10 MG PO TABS: 10.0000 mg | ORAL_TABLET | Freq: Every day | ORAL | 0 refills | Status: DC

## 2019-10-27 MED ORDER — HYDROCODONE-ACETAMINOPHEN 5-325 MG PO TABS: 1.0000 | ORAL_TABLET | Freq: Four times a day (QID) | ORAL | 0 refills | Status: DC | PRN

## 2019-10-27 MED ORDER — APIXABAN 5 MG PO TABS: 5.0000 mg | ORAL_TABLET | Freq: Two times a day (BID) | ORAL | 2 refills | Status: DC

## 2019-10-27 MED ORDER — HYDROXYUREA 500 MG PO CAPS: 500.0000 mg | ORAL_CAPSULE | Freq: Two times a day (BID) | ORAL | 1 refills | Status: DC

## 2019-10-27 MED ORDER — HYDRALAZINE HCL 25 MG PO TABS: 25.0000 mg | ORAL_TABLET | Freq: Three times a day (TID) | ORAL | 0 refills | Status: DC

## 2019-10-27 NOTE — Care Management Important Message (Signed)
Important Message  Patient Details IM Letter given to [facility] Case Manager to present to the Patient Name: Dillon Hill MRN: [id number] Date of Birth: 01-17-1946   Medicare Important Message Given:  Yes     Kerin Salen 10/27/2019, 10:21 AM

## 2019-10-27 NOTE — Evaluation (Signed)
Physical Therapy Evaluation Patient Details Name: Dillon Hill MRN: [id number] DOB: [birth year] Today's Date: 10/27/2019   History of Present Illness  73 year old gentleman with prior history of migraine headaches, BPH, degenerative disc disease, hyperlipidemia, presents to ED with left arm hematoma and migraine headaches. CT of head showed dural venous sinus thrombosis.  Clinical Impression  Pt is independent with mobility, he ambulated 400' without loss of balance. HR 108 at rest, 119 with walking, no dyspnea with activity. From PT standpoint, he is ready to DC home. No further PT indicated, will sign off.      Follow Up Recommendations No PT follow up    Equipment Recommendations  None recommended by PT    Recommendations for Other Services       Precautions / Restrictions Precautions Precautions: None Restrictions Weight Bearing Restrictions: No      Mobility  Bed Mobility Overal bed mobility: Independent                Transfers Overall transfer level: Independent                  Ambulation/Gait Ambulation/Gait assistance: Independent Gait Distance (Feet): 400 Feet Assistive device: None Gait Pattern/deviations: WFL(Within Functional Limits) Gait velocity: WNL   General Gait Details: steady, no loss of balance, HR 108 at rest, 119 with ambulation, 0/4 dyspnea  Stairs            Wheelchair Mobility    Modified Rankin (Stroke Patients Only)       Balance Overall balance assessment: No apparent balance deficits (not formally assessed)                                           Pertinent Vitals/Pain Pain Assessment: 0-10 Pain Score: 5  Pain Location: headache Pain Descriptors / Indicators: Aching Pain Intervention(s): Limited activity within patient's tolerance;Monitored during session;Premedicated before session    Home Living Family/patient expects to be discharged to:: Private residence Living Arrangements:  Alone Available Help at Discharge: Available PRN/intermittently;Family             Additional Comments: daughter available to assist prn    Prior Function Level of Independence: Independent         Comments: drives     Hand Dominance        Extremity/Trunk Assessment   Upper Extremity Assessment Upper Extremity Assessment: Overall WFL for tasks assessed    Lower Extremity Assessment Lower Extremity Assessment: Overall WFL for tasks assessed    Cervical / Trunk Assessment Cervical / Trunk Assessment: Normal  Communication   Communication: No difficulties  Cognition Arousal/Alertness: Awake/alert Behavior During Therapy: WFL for tasks assessed/performed Overall Cognitive Status: Within Functional Limits for tasks assessed                                        General Comments      Exercises     Assessment/Plan    PT Assessment Patent does not need any further PT services  PT Problem List         PT Treatment Interventions      PT Goals (Current goals can be found in the Care Plan section)  Acute Rehab PT Goals PT Goal Formulation: All assessment and education complete, DC therapy    Frequency  Barriers to discharge        Co-evaluation               AM-PAC PT "6 Clicks" Mobility  Outcome Measure Help needed turning from your back to your side while in a flat bed without using bedrails?: None Help needed moving from lying on your back to sitting on the side of a flat bed without using bedrails?: None Help needed moving to and from a bed to a chair (including a wheelchair)?: None Help needed standing up from a chair using your arms (e.g., wheelchair or bedside chair)?: None Help needed to walk in hospital room?: None Help needed climbing 3-5 steps with a railing? : None 6 Click Score: 24    End of Session Equipment Utilized During Treatment: Gait belt Activity Tolerance: Patient tolerated treatment well Patient  left: with call bell/phone within reach;in chair Nurse Communication: Mobility status      Time: [id number] PT Time Calculation (min) (ACUTE ONLY): 13 min   Charges:   PT Evaluation $PT Eval Low Complexity: 1 Low          Philomena Doheny PT 10/27/2019  Acute Rehabilitation Services Pager [phone] Office [phone]

## 2019-10-28 ENCOUNTER — Telehealth: Payer: Self-pay | Admitting: *Deleted

## 2019-10-28 NOTE — Telephone Encounter (Signed)
Patient daughter Dillon Hill) called - VM: Father took dose of Hydrea this morning and experienced nausea and vomited. She wants to know if there is anything he can take Contacted daughter - father in car with her during call. Patient states he took Hydrea on empty stomach and did not eat until a few hours later. Patient states he woke with migraine this morning and vomiting may be related to that. States he will try taking Hydrea with food to see if that helps N/V. He verbalized understanding and will contact Dr. Grier Mitts office if N/V persists when taking Hydrea.  Daughter asked if Dr. Irene Limbo could prescribe anything for migraine - advised patient to contact his PCP for migraine symptom management. Both patient and daughter verbalized understanding.

## 2019-10-29 NOTE — Discharge Summary (Signed)
Physician Discharge Summary  Dillon Hill WEX:[id number] DOB: 1946/05/15 DOA: 10/23/2019  PCP: [name], MD  Admit date: 10/23/2019 Discharge date: 10/27/2019  Admitted From: Home.  Disposition:  Home,   Recommendations for Outpatient Follow-up:  1. Follow up with PCP in 1-2 weeks 2. Please obtain BMP/CBC in one week 3. Please follow up with oncology in one week for checking platelet count and hematocrit 4. Please follow up with Neurology in 4 weeks    Discharge Condition: stable.  CODE STATUS:full code.  Diet recommendation: Heart Healthy   Brief/Interim Summary:  73 year old gentleman with prior history of migraine headaches, BPH, degenerative disc disease, hyperlipidemia, presents to ED with left arm hematoma and migraine headaches..  On arrival to ED he was found to have thrombocytosis with platelet count greater than 1000000, polycythemia.  CT venogram of the head shows Paucity of enhancement within portions of the right sigmoid sinus concerning for possible dural venous sinus thrombosis. .  Neurology was consulted who recommended getting MRI of the head with and without contrast and MRV of the head.  Meanwhile oncology consulted for evaluation of thrombocythemia. MRI and MRV confirms the right sigmoid sinus thrombosis.  He underwent therapeutic phlebotomy of 1 pint of blood, recommended aggressive hydration, and hydroxyurea.  He was transferred to SDU yesterday for closer monitoring for hypertensive emergency and severe headache and therapeutic phlebotomy.  His counts improved with therapeutic phlebotomy and his headaches improved.   Discharge Diagnoses:  Active Problems:   Thrombocytosis (HCC)   Thrombocythemia (HCC)   MPN (myeloproliferative neoplasm) (HCC)   Polycythemia vera (HCC)   Dural venous sinus thrombosis  Acute migraine headaches/  dural venous sinus thrombosis of the right sigmoid sinus Going on for few months now.    MRI brain and MRV shows extensive dural  venous sinus thrombosis of the right sigmoid sinus.  Started the patient on IV heparin, transitioned to eliquis on discharge. Discussed the results with Dr Rory Percy and the patient and his daughter at bedside,  Recommended outpatient follow up with neurology in 4 to 6 weeks.  His headache improved.  No nausea or vomiting this prior to discharge. .    Hypertensive urgency/emergency Resolved.  Started the patient on amlodipine.    Severe thrombocytosis with platelet counts greater than 1000 k on admission.  Improving with aggressive hydration and hydroxyurea. Oncology consulted and appreciate recommendations.  Patient's differential diagnosis include essential thrombocytosis versus polycythemia vera.   In view of his dural venous sinus thrombosis and concern for polycythemia, oncology schedule therapeutic phlebotomy for 1 pint of blood to bring down the hematocrit to 45 or less.. Jak 2 V617F mutation, JAK2 exon 12 mutation, Calreticulin mutation and BCR ABL mutation ordered by oncology.  The results might take 1 to 2 weeks to come back. Continue with  hydroxyurea and eliquis on discharge.  Factor V Leyden and prothrombin gene mutation are also pending .  His hematocrit improved to 47. Platelet count of 860000 this morning.   Recommend checking counts in one week and follow up with oncology in one week.    Stage III CKD Unclear what his baseline creatinine is.  Ultrasound renal negative for hydronephrosis. Creatinine at 1.4 after aggressive hydration.   Discharge Instructions  Discharge Instructions    Diet - low sodium heart healthy   Complete by: As directed    Discharge instructions   Complete by: As directed    Please follow up with Dr Irene Limbo as recommended in one week.  Allergies as of 10/27/2019   No Known Allergies     Medication List    STOP taking these medications   cephALEXin 500 MG capsule Commonly known as: Keflex   hydroxypropyl methylcellulose /  hypromellose 2.5 % ophthalmic solution Commonly known as: ISOPTO TEARS / GONIOVISC   ibuprofen 200 MG tablet Commonly known as: ADVIL   naproxen sodium 220 MG tablet Commonly known as: ALEVE   topiramate 50 MG tablet Commonly known as: TOPAMAX     TAKE these medications   amLODipine 10 MG tablet Commonly known as: NORVASC Take 1 tablet (10 mg total) by mouth daily.   apixaban 5 MG Tabs tablet Commonly known as: ELIQUIS Take 1 tablet (5 mg total) by mouth 2 (two) times daily.   folic acid 1 MG tablet Commonly known as: FOLVITE Take 1 tablet (1 mg total) by mouth daily.   hydrALAZINE 25 MG tablet Commonly known as: APRESOLINE Take 1 tablet (25 mg total) by mouth every 8 (eight) hours.   HYDROcodone-acetaminophen 5-325 MG tablet Commonly known as: NORCO/VICODIN Take 1-2 tablets by mouth every 6 (six) hours as needed for up to 3 days for moderate pain.   hydroxyurea 500 MG capsule Commonly known as: HYDREA Take 1 capsule (500 mg total) by mouth 2 (two) times daily with a meal. May take with food to minimize GI side effects.   multivitamin with minerals Tabs tablet Take 1 tablet by mouth daily.   SUMAtriptan 100 MG tablet Commonly known as: IMITREX Take 100 mg by mouth as needed for migraine or headache.      Follow-up Information    [name], MD. Schedule an appointment as soon as possible for a visit in 1 week(s).   Specialty: Family Medicine Contact information: [place] [address] [phone]        Brunetta Genera, MD. Schedule an appointment as soon as possible for a visit in 1 week(s).   Specialties: Hematology, Oncology Contact information: [place] [address] [phone]          No Known Allergies  Consultations:  Oncology  Neurology.    Procedures/Studies: DG Elbow Complete Left  Result Date: 10/23/2019 CLINICAL DATA:  Arm pain and swelling EXAM: LEFT ELBOW -  COMPLETE 3+ VIEW COMPARISON:  None. FINDINGS: No fracture or malalignment. No significant elbow effusion. Diffuse soft tissue edema IMPRESSION: No acute osseous abnormality Electronically Signed   By: Donavan Foil M.D.   On: 10/23/2019 16:48   CT HEAD WO CONTRAST  Result Date: 10/25/2019 CLINICAL DATA:  73 year old male with severe headache and dural sinus thrombosis demonstrated by recent CTV and MRI/MRV. EXAM: CT HEAD WITHOUT CONTRAST TECHNIQUE: Contiguous axial images were obtained from the base of the skull through the vertex without intravenous contrast. COMPARISON:  MRI/MRV yesterday and earlier. FINDINGS: Brain: No midline shift, mass effect, or evidence of intracranial mass lesion. No acute intracranial hemorrhage identified. No ventriculomegaly. Stable gray-white matter differentiation throughout the brain. Patchy bilateral white matter hypodensity is stable. Vascular: Abnormal areas of the superior sagittal sinus, right transverse and sigmoid sinuses demonstrate abnormal hypodensity rather than hyperdensity (e.g. Series 2, image 8), unchanged from the noncontrast CT on 10/23/2019. But this does seem to be a change from part of the right sagittal sinus that was visible on an 07/15/2015 CT orbit. No suspicious hyperdense intracranial vasculature. Skull: Negative. Sinuses/Orbits: Visualized paranasal sinuses and mastoids are stable and well pneumatized. Other: No acute orbit or scalp  soft tissue findings. IMPRESSION: 1. No new intracranial abnormality. Stable non contrast CT appearance of the brain. 2. Abnormal hypodensity of some dural sinuses is associated with venous sinus thrombosis as demonstrated by recent MRI/MRV. Electronically Signed   By: Genevie Ann M.D.   On: 10/25/2019 18:44   MR Brain W and Wo Contrast  Addendum Date: 10/24/2019   ADDENDUM REPORT: 10/24/2019 18:32 ADDENDUM: Study discussed by telephone with Dr. Karleen Hampshire on 10/24/2019 at 1818 hours Electronically Signed   By: Genevie Ann M.D.   On:  10/24/2019 18:32   Result Date: 10/24/2019 CLINICAL DATA:  73 year old male with suspected dural sinus thrombosis. Severe polycythemia, thrombocytosis, persistent headache. Suspicious CTV. EXAM: MRI HEAD WITHOUT AND WITH CONTRAST MR VENOGRAM HEAD WITHOUT AND WITH CONTRAST TECHNIQUE: Multiplanar, multiecho pulse sequences of the brain and surrounding structures were obtained without and with intravenous contrast. Angiographic images of the intracranial venous structures were obtained using MRV technique without and with intravenous contrast. CONTRAST:  7.45m GADAVIST GADOBUTROL 1 MMOL/ML IV SOLN COMPARISON:  CT venogram yesterday. FINDINGS: MRI HEAD: Brain: No restricted diffusion or evidence of acute infarction. No midline shift, mass effect, evidence of mass lesion, ventriculomegaly, extra-axial collection or acute intracranial hemorrhage. Cervicomedullary junction and pituitary are within normal limits. Patchy and mild to moderate for age scattered cerebral white matter T2 and FLAIR hyperintensity in a nonspecific configuration. No cortical edema or encephalomalacia identified. No chronic cerebral blood products. Deep gray nuclei, brainstem and cerebellum appear negative. No abnormal hyperenhancement, no abnormal dural thickening identified. Vascular: Abnormal loss of the portions of the superior sagittal sinus, right sigmoid sinus and IJ bulb flow voids, with associated abnormal venous enhancement in the region which is further detailed below. Major arterial vascular flow voids at the skull base appear preserved. The left vertebral artery appears dominant. Skull and upper cervical spine: Negative visible cervical spine. Heterogeneous bone marrow signal throughout the skull and visible spine although no destructive osseous lesion is identified. There is associated abnormal diffusion signal in the visible skeleton. Sinuses/Orbits: Postoperative changes to both globes. Otherwise negative orbits. Paranasal sinuses  are clear. Other: Mild left mastoid effusion appears inconsequential. Visible internal auditory structures appear normal. MRV HEAD: 2D and 3D time-of-flight images demonstrate areas of lost signal abnormality in the posterosuperior sagittal sinus corresponding to heterogeneous enhancement of the sinus. There is absent flow signal in the right transverse sinus, sigmoid sinus and IJ bulb. There is preserved flow signal in the torcula, straight sinus, vein of Galen, internal cerebral veins, basal veins of Rosenthal as well as non dominant appearing left transverse sinus, left sigmoid sinus, and left IJ bulb. Post-contrast images corroborate areas of posterosuperior sagittal sinus thrombosis (beginning at the vertex series 4, image 120) and continuing through the torcula (image 54). Intermittent thrombosis of the right transverse sinus. More pronounced thrombosis of the right sigmoid sinus continuing to the right IJ bulb (image 16). And also thrombosis of the right paravertebral emissary vein on series 4, image 10. IMPRESSION: 1. Positive for dural venous sinus thrombosis occurring intermittently from the posterosuperior sagittal sinus through the dominant right transverse and sigmoid sinuses, also involving the right IJ bulb and another emissary vein at the right skull base. 2. Negative for associated acute infarct or cerebral edema. 3. Abnormal bone marrow signal suspicious for an infiltrative marrow process in this clinical setting. No destructive osseous lesion identified. 4. Mild to moderate for age nonspecific cerebral white matter signal changes, most commonly due to chronic small vessel disease. Electronically Signed:  By: Genevie Ann M.D. On: 10/24/2019 18:09   US Renal  Result Date: 10/24/2019 CLINICAL DATA:  Acute renal insufficiency EXAM: RENAL / URINARY TRACT ULTRASOUND COMPLETE COMPARISON:  None. FINDINGS: Right Kidney: Renal measurements: 10.3 x 3.5 x 5.1 cm = volume: 96.7 mL . Echogenicity within normal  limits. No mass or hydronephrosis visualized. Left Kidney: Renal measurements: 10.4 x 6.0 x 4.4 cm = volume: 141.5 mL. Echogenicity within normal limits. No mass or hydronephrosis visualized. Bladder: Appears normal for degree of bladder distention. Other: None. IMPRESSION: Normal study.  No cause for acute renal insufficiency identified. Electronically Signed   By: Dorise Bullion III M.D   On: 10/24/2019 19:16   US Venous Img Upper Left (DVT Study)  Result Date: 10/23/2019 CLINICAL DATA:  Bruising and swelling of left arm from the biceps to the wrist for past 5 days. Evaluate for DVT. EXAM: LEFT UPPER EXTREMITY VENOUS DOPPLER ULTRASOUND TECHNIQUE: Gray-scale sonography with graded compression, as well as color Doppler and duplex ultrasound were performed to evaluate the upper extremity deep venous system from the level of the subclavian vein and including the jugular, axillary, basilic, radial, ulnar and upper cephalic vein. Spectral Doppler was utilized to evaluate flow at rest and with distal augmentation maneuvers. COMPARISON:  None. FINDINGS: Contralateral Subclavian Vein: Respiratory phasicity is normal and symmetric with the symptomatic side. No evidence of thrombus. Normal compressibility. Internal Jugular Vein: No evidence of thrombus. Normal compressibility, respiratory phasicity and response to augmentation. Subclavian Vein: No evidence of thrombus. Normal compressibility, respiratory phasicity and response to augmentation. Axillary Vein: No evidence of thrombus. Normal compressibility, respiratory phasicity and response to augmentation. Cephalic Vein: No evidence of thrombus. Normal compressibility, respiratory phasicity and response to augmentation. Basilic Vein: No evidence of thrombus. Normal compressibility, respiratory phasicity and response to augmentation. Brachial Veins: No evidence of thrombus. Normal compressibility, respiratory phasicity and response to augmentation. Radial Veins: No  evidence of thrombus. Normal compressibility, respiratory phasicity and response to augmentation. Ulnar Veins: No evidence of thrombus. Normal compressibility, respiratory phasicity and response to augmentation. Venous Reflux:  None visualized. Other Findings: There are 2 adjacent soft tissue nodules at the mid humeral aspect of the left upper arm associated with the adjacent bicipital musculature with dominant nodule measuring approximately 2.9 x 2.1 cm and smaller adjacent nodule measuring 1.3 x 1.6 x 1.1 cm. IMPRESSION: 1. No evidence of DVT within the left upper extremity. 2. Soft tissue nodules at the mid humeral aspect of the left upper arm associated with the bicipital musculature, nonspecific though could represent a bicipital muscular tear (either partial or complete) with associated hematoma. Clinical correlation is advised. Electronically Signed   By: Sandi Mariscal M.D.   On: 10/23/2019 17:00   MR MRV HEAD W WO CONTRAST  Addendum Date: 10/24/2019   ADDENDUM REPORT: 10/24/2019 18:32 ADDENDUM: Study discussed by telephone with Dr. Karleen Hampshire on 10/24/2019 at 1818 hours Electronically Signed   By: Genevie Ann M.D.   On: 10/24/2019 18:32   Result Date: 10/24/2019 CLINICAL DATA:  73 year old male with suspected dural sinus thrombosis. Severe polycythemia, thrombocytosis, persistent headache. Suspicious CTV. EXAM: MRI HEAD WITHOUT AND WITH CONTRAST MR VENOGRAM HEAD WITHOUT AND WITH CONTRAST TECHNIQUE: Multiplanar, multiecho pulse sequences of the brain and surrounding structures were obtained without and with intravenous contrast. Angiographic images of the intracranial venous structures were obtained using MRV technique without and with intravenous contrast. CONTRAST:  7.76m GADAVIST GADOBUTROL 1 MMOL/ML IV SOLN COMPARISON:  CT venogram yesterday. FINDINGS: MRI HEAD: Brain:  No restricted diffusion or evidence of acute infarction. No midline shift, mass effect, evidence of mass lesion, ventriculomegaly, extra-axial  collection or acute intracranial hemorrhage. Cervicomedullary junction and pituitary are within normal limits. Patchy and mild to moderate for age scattered cerebral white matter T2 and FLAIR hyperintensity in a nonspecific configuration. No cortical edema or encephalomalacia identified. No chronic cerebral blood products. Deep gray nuclei, brainstem and cerebellum appear negative. No abnormal hyperenhancement, no abnormal dural thickening identified. Vascular: Abnormal loss of the portions of the superior sagittal sinus, right sigmoid sinus and IJ bulb flow voids, with associated abnormal venous enhancement in the region which is further detailed below. Major arterial vascular flow voids at the skull base appear preserved. The left vertebral artery appears dominant. Skull and upper cervical spine: Negative visible cervical spine. Heterogeneous bone marrow signal throughout the skull and visible spine although no destructive osseous lesion is identified. There is associated abnormal diffusion signal in the visible skeleton. Sinuses/Orbits: Postoperative changes to both globes. Otherwise negative orbits. Paranasal sinuses are clear. Other: Mild left mastoid effusion appears inconsequential. Visible internal auditory structures appear normal. MRV HEAD: 2D and 3D time-of-flight images demonstrate areas of lost signal abnormality in the posterosuperior sagittal sinus corresponding to heterogeneous enhancement of the sinus. There is absent flow signal in the right transverse sinus, sigmoid sinus and IJ bulb. There is preserved flow signal in the torcula, straight sinus, vein of Galen, internal cerebral veins, basal veins of Rosenthal as well as non dominant appearing left transverse sinus, left sigmoid sinus, and left IJ bulb. Post-contrast images corroborate areas of posterosuperior sagittal sinus thrombosis (beginning at the vertex series 4, image 120) and continuing through the torcula (image 54). Intermittent  thrombosis of the right transverse sinus. More pronounced thrombosis of the right sigmoid sinus continuing to the right IJ bulb (image 16). And also thrombosis of the right paravertebral emissary vein on series 4, image 10. IMPRESSION: 1. Positive for dural venous sinus thrombosis occurring intermittently from the posterosuperior sagittal sinus through the dominant right transverse and sigmoid sinuses, also involving the right IJ bulb and another emissary vein at the right skull base. 2. Negative for associated acute infarct or cerebral edema. 3. Abnormal bone marrow signal suspicious for an infiltrative marrow process in this clinical setting. No destructive osseous lesion identified. 4. Mild to moderate for age nonspecific cerebral white matter signal changes, most commonly due to chronic small vessel disease. Electronically Signed: By: Genevie Ann M.D. On: 10/24/2019 18:09   CT VENOGRAM HEAD  Result Date: 10/23/2019 CLINICAL DATA:  Dural venous sinus thrombosis suspected, several weeks of headaches, platelets over 1 million, concern for venous sinus thrombosis versus bleed. EXAM: CT VENOGRAM HEAD TECHNIQUE: Noncontrast head CT was performed. Coronal and sagittal reconstructions were also submitted for evaluation. Postcontrast CT venography of the head was also performed. Coronal and sagittal reconstructions as well as MIP reconstructions were submitted for evaluation. CONTRAST:  44m OMNIPAQUE IOHEXOL 300 MG/ML  SOLN COMPARISON:  No pertinent prior studies available for comparison. FINDINGS: There is no evidence of acute intracranial hemorrhage. No demarcated cortical infarction. No evidence of intracranial mass. No midline shift or extra-axial fluid collection. Moderate patchy hypodensity within the cerebral white matter is nonspecific, but consistent with chronic small vessel ischemic disease. Mild generalized parenchymal atrophy. There is paucity of enhancement within portions of the right sigmoid sinus  concerning for possible dural venous sinus thrombosis (for instance as seen on series 13, images 13-24 and series 7, images 29-30). The superior sagittal  sinus is diminutive inferiorly, although patent without without convincing evidence of dural venous sinus thrombosis. The internal cerebral veins, vein of Galen, straight sinus, bilateral transverse and left sigmoid sinuses are patent. No hyperdense vessel. No calvarial fracture or suspicious osseous lesion. Visualized orbits demonstrate no acute abnormality. Minimal ethmoid sinus mucosal thickening. No significant mastoid effusion. These results were called by telephone at the time of interpretation on 10/23/2019 at 8:14 pm to provider [facility] , who verbally acknowledged these results. IMPRESSION: 1. Paucity of enhancement within portions of the right sigmoid sinus concerning for possible dural venous sinus thrombosis. MRI and MRV head with and without contrast is recommended for further evaluation. 2. No evidence of intracranial hemorrhage or demarcated infarct. 3. Mild generalized parenchymal atrophy and chronic small vessel ischemic disease. Electronically Signed   By: Kellie Simmering DO   On: 10/23/2019 20:15      Subjective: No new complaints. Head ache improved. No nausea or vomiting. No chest pain or sob.   Discharge Exam: Vitals:   10/27/19 1308 10/27/19 1427  BP:  (!) 148/94  Pulse: (!) 119 (!) 105  Resp:    Temp:    SpO2:  97%   Vitals:   10/27/19 0816 10/27/19 1016 10/27/19 1308 10/27/19 1427  BP: 124/70 136/79  (!) 148/94  Pulse: (!) 105 (!) 108 (!) 119 (!) 105  Resp:      Temp:      TempSrc:      SpO2: 98% 97%  97%  Weight:      Height:        General: Pt is alert, awake, not in acute distress Cardiovascular: RRR, S1/S2 +, no rubs, no gallops Respiratory: CTA bilaterally, no wheezing, no rhonchi Abdominal: Soft, NT, ND, bowel sounds + Extremities: no edema, no cyanosis    The results of significant  diagnostics from this hospitalization (including imaging, microbiology, ancillary and laboratory) are listed below for reference.     Microbiology: Recent Results (from the past 240 hour(s))  SARS Coronavirus 2 Ag (30 min TAT) - Nasal Swab (BD Veritor Kit)     Status: None   Collection Time: 10/23/19  8:22 PM   Specimen: Nasal Swab (BD Veritor Kit)  Result Value Ref Range Status   SARS Coronavirus 2 Ag NEGATIVE NEGATIVE Final    Comment: (NOTE) SARS-CoV-2 antigen NOT DETECTED.  Negative results are presumptive.  Negative results do not preclude SARS-CoV-2 infection and should not be used as the sole basis for treatment or other patient management decisions, including infection  control decisions, particularly in the presence of clinical signs and  symptoms consistent with COVID-19, or in those who have been in contact with the virus.  Negative results must be combined with clinical observations, patient history, and epidemiological information. The expected result is Negative. Fact Sheet for Patients: PodPark.tn Fact Sheet for Healthcare Providers: GiftContent.is This test is not yet approved or cleared by the [place] FDA and  has been authorized for detection and/or diagnosis of SARS-CoV-2 by FDA under an Emergency Use Authorization (EUA).  This EUA will remain in effect (meaning this test can be used) for the duration of  the COVID-19 de claration under Section 564(b)(1) of the Act, 21 U.S.C. section 360bbb-3(b)(1), unless the authorization is terminated or revoked sooner. Performed at [facility], [address] [place], [place] [address]   SARS CORONAVIRUS 2 (TAT 6-24 HRS) Nasopharyngeal Nasopharyngeal Swab     Status: None   Collection Time: 10/23/19  9:11 PM   Specimen: Nasopharyngeal Swab  Result Value Ref Range Status   SARS Coronavirus 2 NEGATIVE NEGATIVE Final    Comment: (NOTE) SARS-CoV-2  target nucleic acids are NOT DETECTED. The SARS-CoV-2 RNA is generally detectable in upper and lower respiratory specimens during the acute phase of infection. Negative results do not preclude SARS-CoV-2 infection, do not rule out co-infections with other pathogens, and should not be used as the sole basis for treatment or other patient management decisions. Negative results must be combined with clinical observations, patient history, and epidemiological information. The expected result is Negative. Fact Sheet for Patients: SugarRoll.be Fact Sheet for Healthcare Providers: https://www.woods-mathews.com/ This test is not yet approved or cleared by the [place] FDA and  has been authorized for detection and/or diagnosis of SARS-CoV-2 by FDA under an Emergency Use Authorization (EUA). This EUA will remain  in effect (meaning this test can be used) for the duration of the COVID-19 declaration under Section 56 4(b)(1) of the Act, 21 U.S.C. section 360bbb-3(b)(1), unless the authorization is terminated or revoked sooner. Performed at [facility], [place] [address] [place], [place] [address]   MRSA PCR Screening     Status: None   Collection Time: 10/26/19  2:20 AM   Specimen: Nasopharyngeal  Result Value Ref Range Status   MRSA by PCR NEGATIVE NEGATIVE Final    Comment:        The GeneXpert MRSA Assay (FDA approved for NASAL specimens only), is one component of a comprehensive MRSA colonization surveillance program. It is not intended to diagnose MRSA infection nor to guide or monitor treatment for MRSA infections. Performed at [facility], [place] [address] [place], [place] [address]      Labs: BNP (last 3 results) No results for input(s): BNP in the last 8760 hours. Basic Metabolic Panel: Recent Labs  Lab 10/23/19 1411 10/24/19 0756 10/25/19 0317 10/26/19 0357  NA 136  --  139 137  K 4.2  --  4.1 3.9   CL 105  --  105 103  CO2 23  --  24 22  GLUCOSE 99  --  91 110*  BUN 28*  --  27* 21  CREATININE 1.51* 1.42* 1.63* 1.40*  CALCIUM 9.2  --  8.9 9.0   Liver Function Tests: Recent Labs  Lab 10/25/19 0317 10/26/19 0357  AST 27 26  ALT 13 13  ALKPHOS 112 127*  BILITOT 1.2 1.1  PROT 6.0* 6.4*  ALBUMIN 3.2* 3.2*   No results for input(s): LIPASE, AMYLASE in the last 168 hours. No results for input(s): AMMONIA in the last 168 hours. CBC: Recent Labs  Lab 10/23/19 1411 10/24/19 0756 10/25/19 0317 10/26/19 0037 10/26/19 0357 10/27/19 0638  WBC 16.1* 16.7* 16.7* 24.6* 25.1* 19.1*  NEUTROABS 11.2*  --  11.0*  --  20.8* 15.7*  HGB 14.7 15.0 14.3 13.5 14.1 13.1  HCT 51.7 53.8* 51.9 47.4 49.5 47.4  MCV 68.8* 69.0* 69.7* 68.8* 69.3* 69.5*  PLT 1,005* 917* 865* 843* 860* 864*   Cardiac Enzymes: No results for input(s): CKTOTAL, CKMB, CKMBINDEX, TROPONINI in the last 168 hours. BNP: Invalid input(s): POCBNP CBG: No results for input(s): GLUCAP in the last 168 hours. D-Dimer No results for input(s): DDIMER in the last 72 hours. Hgb A1c No results for input(s): HGBA1C in the last 72 hours. Lipid Profile No results for input(s): CHOL, HDL, LDLCALC, TRIG, CHOLHDL, LDLDIRECT in the last 72 hours. Thyroid function studies No results for input(s):  TSH, T4TOTAL, T3FREE, THYROIDAB in the last 72 hours.  Invalid input(s): FREET3 Anemia work up Recent Labs    10/27/19 0638  RETICCTPCT 1.7   Urinalysis    Component Value Date/Time   COLORURINE YELLOW 10/24/2019 1726   APPEARANCEUR CLEAR 10/24/2019 1726   LABSPEC 1.013 10/24/2019 1726   PHURINE 6.0 10/24/2019 1726   GLUCOSEU NEGATIVE 10/24/2019 1726   [place] 10/24/2019 1726   [place] 10/24/2019 1726   KETONESUR NEGATIVE 10/24/2019 1726   PROTEINUR 100 (A) 10/24/2019 1726   NITRITE NEGATIVE 10/24/2019 1726   LEUKOCYTESUR NEGATIVE 10/24/2019 1726   Sepsis Labs Invalid input(s): PROCALCITONIN,  WBC,   LACTICIDVEN Microbiology Recent Results (from the past 240 hour(s))  SARS Coronavirus 2 Ag (30 min TAT) - Nasal Swab (BD Veritor Kit)     Status: None   Collection Time: 10/23/19  8:22 PM   Specimen: Nasal Swab (BD Veritor Kit)  Result Value Ref Range Status   SARS Coronavirus 2 Ag NEGATIVE NEGATIVE Final    Comment: (NOTE) SARS-CoV-2 antigen NOT DETECTED.  Negative results are presumptive.  Negative results do not preclude SARS-CoV-2 infection and should not be used as the sole basis for treatment or other patient management decisions, including infection  control decisions, particularly in the presence of clinical signs and  symptoms consistent with COVID-19, or in those who have been in contact with the virus.  Negative results must be combined with clinical observations, patient history, and epidemiological information. The expected result is Negative. Fact Sheet for Patients: PodPark.tn Fact Sheet for Healthcare Providers: GiftContent.is This test is not yet approved or cleared by the [place] FDA and  has been authorized for detection and/or diagnosis of SARS-CoV-2 by FDA under an Emergency Use Authorization (EUA).  This EUA will remain in effect (meaning this test can be used) for the duration of  the COVID-19 de claration under Section 564(b)(1) of the Act, 21 U.S.C. section 360bbb-3(b)(1), unless the authorization is terminated or revoked sooner. Performed at [facility], [place] [address] [place], [place] [address]   SARS CORONAVIRUS 2 (TAT 6-24 HRS) Nasopharyngeal Nasopharyngeal Swab     Status: None   Collection Time: 10/23/19  9:11 PM   Specimen: Nasopharyngeal Swab  Result Value Ref Range Status   SARS Coronavirus 2 NEGATIVE NEGATIVE Final    Comment: (NOTE) SARS-CoV-2 target nucleic acids are NOT DETECTED. The SARS-CoV-2 RNA is generally detectable in upper and lower respiratory specimens  during the acute phase of infection. Negative results do not preclude SARS-CoV-2 infection, do not rule out co-infections with other pathogens, and should not be used as the sole basis for treatment or other patient management decisions. Negative results must be combined with clinical observations, patient history, and epidemiological information. The expected result is Negative. Fact Sheet for Patients: SugarRoll.be Fact Sheet for Healthcare Providers: https://www.woods-mathews.com/ This test is not yet approved or cleared by the [place] FDA and  has been authorized for detection and/or diagnosis of SARS-CoV-2 by FDA under an Emergency Use Authorization (EUA). This EUA will remain  in effect (meaning this test can be used) for the duration of the COVID-19 declaration under Section 56 4(b)(1) of the Act, 21 U.S.C. section 360bbb-3(b)(1), unless the authorization is terminated or revoked sooner. Performed at [facility], [place] [address], [place], [place] [address]   MRSA PCR Screening     Status: None   Collection Time: 10/26/19  2:20 AM   Specimen: Nasopharyngeal  Result  Value Ref Range Status   MRSA by PCR NEGATIVE NEGATIVE Final    Comment:        The GeneXpert MRSA Assay (FDA approved for NASAL specimens only), is one component of a comprehensive MRSA colonization surveillance program. It is not intended to diagnose MRSA infection nor to guide or monitor treatment for MRSA infections. Performed at [facility], [place] [address], [place], [place] [address]      Time coordinating discharge: 31 minutes  SIGNED:   Hosie Poisson, MD  [facility]

## 2019-10-30 LAB — FACTOR 5 LEIDEN

## 2019-10-30 LAB — PROTHROMBIN GENE MUTATION

## 2019-11-02 ENCOUNTER — Other Ambulatory Visit: Payer: Self-pay | Admitting: *Deleted

## 2019-11-02 DIAGNOSIS — D471 Chronic myeloproliferative disease: Secondary | ICD-10-CM

## 2019-11-02 LAB — JAK2  V617F QUAL. WITH REFLEX TO EXON 12: Reflex:: 15

## 2019-11-02 LAB — BCR-ABL1, CML/ALL, PCR, QUANT: Interpretation (BCRAL):: NEGATIVE

## 2019-11-02 LAB — VON WILLEBRAND PANEL
Coagulation Factor VIII: 96 % (ref 56–140)
Ristocetin Co-factor, Plasma: 49 % — ABNORMAL LOW (ref 50–200)
Von Willebrand Antigen, Plasma: 154 % (ref 50–200)

## 2019-11-02 LAB — CALRETICULIN (CALR) MUTATION ANALYSIS

## 2019-11-02 LAB — COAG STUDIES INTERP REPORT

## 2019-11-02 NOTE — Progress Notes (Signed)
HEMATOLOGY/ONCOLOGY CONSULTATION NOTE  Date of Service: 11/03/2019  Patient Care Team: [name], MD as PCP - General (Family Medicine)  CHIEF COMPLAINTS/PURPOSE OF CONSULTATION:  Concern for a myeloproliferative neoplasm likely polycythemia vera or essential thrombocytosis  HISTORY OF PRESENTING ILLNESS:  Dillon Hill is a wonderful 73 y.o. male with medical history significant for migraine headaches benign prostatic hypertrophy and hyperlipidemia who presented to the [facility] in [facility] with large hematoma to left arm.  He was noted to have thrombocytopenia.  Other concerns will be noted.  He has been transferred for further work-up here ED Course: In the emergency room patient had work-up including including which was concerning for possible dural venous sinus thrombosis and MRI with and without contrast and MRV to evaluate was slated to be done at [facility] long. Patient had no significant symptoms of any kind.  INTERVAL HISTORY:   Dillon Hill is a wonderful 74 y.o. male is here for the evaluation and management of concern for a myeloproliferative neoplasm likely polycythemia vera or essential thrombocytosis. The patient was last seen on 10/25/2019. The pt reports that he is doing well overall.  The pt reports that he is still having near-constant headaches. His pain level has come down from an 8 to 2 and he is taking Tylenol about every 4 hours. Pt was placed on Hydralazine while in the hospital and has been taking it since. He has also continued to take Hydroxyurea and Eliquis as prescribed. He has been taking Hydroxyurea with food but still notes some nausea. Pt does have blood pressure monitor at home and his daughter says that his BP have been consistently good. His PCP is monitoring his HTN at this time. Pt is not sleeping well at home. He has never been able to sleep very well but notes that sleep deprivation triggers his headaches. Pt has been staying well hydrated by  drinking 3-4 16 oz waters per day and has been eating well. He has been having very hard stools and constipation.   Lab results today (11/03/19) of CBC w/diff and CMP is as follows: all values are WNL except for RBC at 6.94, MCV at 68.3, MCH at 19.9, MCHC at 29.1, RDW at 27.4, PLT at 556K, nRBC Rel at 2.3, Lymphs Abs at 0.6K, Baso Abs at 0.2K, Polychromasia is "Present", Target Cells are "Present", Glucose at 118, Creatinine at 1.43, Alkaline Phosphatase at 127, GFR Est Non Af Am at 43. 10/24/2019 JAK2 GenotypR is "POSITIVE for the detection of the V617F mutation" 10/24/2019 CALR Mutation analysis is "Negative"  10/24/2019 BCRAL is "Negative"   On review of systems, pt reports headaches, nausea, constipation, hard stools, sleeplessness and denies any other symptoms.   MEDICAL HISTORY:  Past Medical History:  Diagnosis Date  . BPH (benign prostatic hyperplasia)   . Colon polyp   . DDD (degenerative disc disease), cervical   . High cholesterol   . Migraine   . Polycythemia vera ([place])     SURGICAL HISTORY: Past Surgical History:  Procedure Laterality Date  . EYE SURGERY      SOCIAL HISTORY: Social History   Socioeconomic History  . Marital status: Married    Spouse name: Not on file  . Number of children: Not on file  . Years of education: Not on file  . Highest education level: Not on file  Occupational History  . Not on file  Tobacco Use  . Smoking status: Never Smoker  . Smokeless tobacco: Never Used  .  Tobacco comment: smoked x 1 year at age 54  Substance and Sexual Activity  . Alcohol use: Yes    Comment: occ  . Drug use: Never  . Sexual activity: Not on file  Other Topics Concern  . Not on file  Social History Narrative  . Not on file   Social Determinants of Health   Financial Resource Strain:   . Difficulty of Paying Living Expenses: Not on file  Food Insecurity:   . Worried About Charity fundraiser in the Last Year: Not on file  . Ran Out of Food in the  Last Year: Not on file  Transportation Needs:   . Lack of Transportation (Medical): Not on file  . Lack of Transportation (Non-Medical): Not on file  Physical Activity:   . Days of Exercise per Week: Not on file  . Minutes of Exercise per Session: Not on file  Stress:   . Feeling of Stress : Not on file  Social Connections:   . Frequency of Communication with Friends and Family: Not on file  . Frequency of Social Gatherings with Friends and Family: Not on file  . Attends Religious Services: Not on file  . Active Member of Clubs or Organizations: Not on file  . Attends Archivist Meetings: Not on file  . Marital Status: Not on file  Intimate Partner Violence:   . Fear of Current or Ex-Partner: Not on file  . Emotionally Abused: Not on file  . Physically Abused: Not on file  . Sexually Abused: Not on file    FAMILY HISTORY: No family history on file.  ALLERGIES:  has No Known Allergies.  MEDICATIONS:  Current Outpatient Medications  Medication Sig Dispense Refill  . amLODipine (NORVASC) 10 MG tablet Take 1 tablet (10 mg total) by mouth daily. 30 tablet 0  . apixaban (ELIQUIS) 5 MG TABS tablet Take 1 tablet (5 mg total) by mouth 2 (two) times daily. 60 tablet 2  . folic acid (FOLVITE) 1 MG tablet Take 1 tablet (1 mg total) by mouth daily. 30 tablet 1  . hydrALAZINE (APRESOLINE) 25 MG tablet Take 1 tablet (25 mg total) by mouth every 12 (twelve) hours. 90 tablet 0  . hydroxyurea (HYDREA) 500 MG capsule Take 1 capsule (500 mg total) by mouth 2 (two) times daily with a meal. May take with food to minimize GI side effects. 60 capsule 1  . Multiple Vitamin (MULTIVITAMIN WITH MINERALS) TABS tablet Take 1 tablet by mouth daily.    . SUMAtriptan (IMITREX) 100 MG tablet Take 100 mg by mouth as needed for migraine or headache.   1  . magnesium citrate SOLN Take 148 mLs (0.5 Bottles total) by mouth once as needed for up to 1 dose for moderate constipation or severe constipation. May  repeat in 3-4 hours if no bowel movement 195 mL 0  . nortriptyline (PAMELOR) 10 MG capsule Take 1 capsule (10 mg total) by mouth at bedtime. 30 capsule 1   No current facility-administered medications for this visit.    REVIEW OF SYSTEMS:    10 Point review of Systems was done is negative except as noted above.  PHYSICAL EXAMINATION: ECOG PERFORMANCE STATUS: 2 - Symptomatic, <50% confined to bed  . Vitals:   11/03/19 1121  BP: 128/79  Pulse: 94  Resp: 18  Temp: 98.2 F (36.8 C)  SpO2: 99%   Filed Weights   11/03/19 1121  Weight: 147 lb 6.4 oz (66.9 kg)   .Body  mass index is 20.56 kg/m.  GENERAL:alert, in no acute distress and comfortable SKIN: no acute rashes, no significant lesions EYES: conjunctiva are pink and non-injected, sclera anicteric OROPHARYNX: MMM, no exudates, no oropharyngeal erythema or ulceration NECK: supple, no JVD LYMPH:  no palpable lymphadenopathy in the cervical, axillary or inguinal regions LUNGS: clear to auscultation b/l with normal respiratory effort HEART: regular rate & rhythm ABDOMEN:  normoactive bowel sounds , non tender, not distended. Barely palpable spleen.  Extremity: no pedal edema PSYCH: alert & oriented x 3 with fluent speech NEURO: no focal motor/sensory deficits  LABORATORY DATA:  I have reviewed the data as listed  . CBC Latest Ref Rng & Units 11/03/2019 10/27/2019 10/26/2019  WBC 4.0 - 10.5 K/uL 4.0 19.1(H) 25.1(H)  Hemoglobin 13.0 - 17.0 g/dL 13.8 13.1 14.1  Hematocrit 39.0 - 52.0 % 47.4 47.4 49.5  Platelets 150 - 400 K/uL 556(H) 864(H) 860(H)    . CMP Latest Ref Rng & Units 11/03/2019 10/26/2019 10/25/2019  Glucose 70 - 99 mg/dL 118(H) 110(H) 91  BUN 8 - 23 mg/dL 23 21 27(H)  Creatinine 0.61 - 1.24 mg/dL 1.43(H) 1.40(H) 1.63(H)  Sodium 135 - 145 mmol/L 138 137 139  Potassium 3.5 - 5.1 mmol/L 3.7 3.9 4.1  Chloride 98 - 111 mmol/L 102 103 105  CO2 22 - 32 mmol/L 27 22 24   Calcium 8.9 - 10.3 mg/dL 9.2 9.0 8.9  Total  Protein 6.5 - 8.1 g/dL 7.0 6.4(L) 6.0(L)  Total Bilirubin 0.3 - 1.2 mg/dL 0.8 1.1 1.2  Alkaline Phos 38 - 126 U/L 127(H) 127(H) 112  AST 15 - 41 U/L 32 26 27  ALT 0 - 44 U/L 22 13 13    10/24/2019 JAK2 mutation testing:    RADIOGRAPHIC STUDIES: I have personally reviewed the radiological images as listed and agreed with the findings in the report. DG Elbow Complete Left  Result Date: 10/23/2019 CLINICAL DATA:  Arm pain and swelling EXAM: LEFT ELBOW - COMPLETE 3+ VIEW COMPARISON:  None. FINDINGS: No fracture or malalignment. No significant elbow effusion. Diffuse soft tissue edema IMPRESSION: No acute osseous abnormality Electronically Signed   By: Donavan Foil M.D.   On: 10/23/2019 16:48   CT HEAD WO CONTRAST  Result Date: 10/25/2019 CLINICAL DATA:  73 year old male with severe headache and dural sinus thrombosis demonstrated by recent CTV and MRI/MRV. EXAM: CT HEAD WITHOUT CONTRAST TECHNIQUE: Contiguous axial images were obtained from the base of the skull through the vertex without intravenous contrast. COMPARISON:  MRI/MRV yesterday and earlier. FINDINGS: Brain: No midline shift, mass effect, or evidence of intracranial mass lesion. No acute intracranial hemorrhage identified. No ventriculomegaly. Stable gray-white matter differentiation throughout the brain. Patchy bilateral white matter hypodensity is stable. Vascular: Abnormal areas of the superior sagittal sinus, right transverse and sigmoid sinuses demonstrate abnormal hypodensity rather than hyperdensity (e.g. Series 2, image 8), unchanged from the noncontrast CT on 10/23/2019. But this does seem to be a change from part of the right sagittal sinus that was visible on an 07/15/2015 CT orbit. No suspicious hyperdense intracranial vasculature. Skull: Negative. Sinuses/Orbits: Visualized paranasal sinuses and mastoids are stable and well pneumatized. Other: No acute orbit or scalp soft tissue findings. IMPRESSION: 1. No new intracranial  abnormality. Stable non contrast CT appearance of the brain. 2. Abnormal hypodensity of some dural sinuses is associated with venous sinus thrombosis as demonstrated by recent MRI/MRV. Electronically Signed   By: Genevie Ann M.D.   On: 10/25/2019 18:44   MR Brain W  and Wo Contrast  Addendum Date: 10/24/2019   ADDENDUM REPORT: 10/24/2019 18:32 ADDENDUM: Study discussed by telephone with Dr. Karleen Hampshire on 10/24/2019 at 1818 hours Electronically Signed   By: Genevie Ann M.D.   On: 10/24/2019 18:32   Result Date: 10/24/2019 CLINICAL DATA:  73 year old male with suspected dural sinus thrombosis. Severe polycythemia, thrombocytosis, persistent headache. Suspicious CTV. EXAM: MRI HEAD WITHOUT AND WITH CONTRAST MR VENOGRAM HEAD WITHOUT AND WITH CONTRAST TECHNIQUE: Multiplanar, multiecho pulse sequences of the brain and surrounding structures were obtained without and with intravenous contrast. Angiographic images of the intracranial venous structures were obtained using MRV technique without and with intravenous contrast. CONTRAST:  7.42mL GADAVIST GADOBUTROL 1 MMOL/ML IV SOLN COMPARISON:  CT venogram yesterday. FINDINGS: MRI HEAD: Brain: No restricted diffusion or evidence of acute infarction. No midline shift, mass effect, evidence of mass lesion, ventriculomegaly, extra-axial collection or acute intracranial hemorrhage. Cervicomedullary junction and pituitary are within normal limits. Patchy and mild to moderate for age scattered cerebral white matter T2 and FLAIR hyperintensity in a nonspecific configuration. No cortical edema or encephalomalacia identified. No chronic cerebral blood products. Deep gray nuclei, brainstem and cerebellum appear negative. No abnormal hyperenhancement, no abnormal dural thickening identified. Vascular: Abnormal loss of the portions of the superior sagittal sinus, right sigmoid sinus and IJ bulb flow voids, with associated abnormal venous enhancement in the region which is further detailed below.  Major arterial vascular flow voids at the skull base appear preserved. The left vertebral artery appears dominant. Skull and upper cervical spine: Negative visible cervical spine. Heterogeneous bone marrow signal throughout the skull and visible spine although no destructive osseous lesion is identified. There is associated abnormal diffusion signal in the visible skeleton. Sinuses/Orbits: Postoperative changes to both globes. Otherwise negative orbits. Paranasal sinuses are clear. Other: Mild left mastoid effusion appears inconsequential. Visible internal auditory structures appear normal. MRV HEAD: 2D and 3D time-of-flight images demonstrate areas of lost signal abnormality in the posterosuperior sagittal sinus corresponding to heterogeneous enhancement of the sinus. There is absent flow signal in the right transverse sinus, sigmoid sinus and IJ bulb. There is preserved flow signal in the torcula, straight sinus, vein of Galen, internal cerebral veins, basal veins of Rosenthal as well as non dominant appearing left transverse sinus, left sigmoid sinus, and left IJ bulb. Post-contrast images corroborate areas of posterosuperior sagittal sinus thrombosis (beginning at the vertex series 4, image 120) and continuing through the torcula (image 54). Intermittent thrombosis of the right transverse sinus. More pronounced thrombosis of the right sigmoid sinus continuing to the right IJ bulb (image 16). And also thrombosis of the right paravertebral emissary vein on series 4, image 10. IMPRESSION: 1. Positive for dural venous sinus thrombosis occurring intermittently from the posterosuperior sagittal sinus through the dominant right transverse and sigmoid sinuses, also involving the right IJ bulb and another emissary vein at the right skull base. 2. Negative for associated acute infarct or cerebral edema. 3. Abnormal bone marrow signal suspicious for an infiltrative marrow process in this clinical setting. No destructive  osseous lesion identified. 4. Mild to moderate for age nonspecific cerebral white matter signal changes, most commonly due to chronic small vessel disease. Electronically Signed: By: Genevie Ann M.D. On: 10/24/2019 18:09   US Renal  Result Date: 10/24/2019 CLINICAL DATA:  Acute renal insufficiency EXAM: RENAL / URINARY TRACT ULTRASOUND COMPLETE COMPARISON:  None. FINDINGS: Right Kidney: Renal measurements: 10.3 x 3.5 x 5.1 cm = volume: 96.7 mL . Echogenicity within normal limits. No mass  or hydronephrosis visualized. Left Kidney: Renal measurements: 10.4 x 6.0 x 4.4 cm = volume: 141.5 mL. Echogenicity within normal limits. No mass or hydronephrosis visualized. Bladder: Appears normal for degree of bladder distention. Other: None. IMPRESSION: Normal study.  No cause for acute renal insufficiency identified. Electronically Signed   By: Dorise Bullion III M.D   On: 10/24/2019 19:16   US Venous Img Upper Left (DVT Study)  Result Date: 10/23/2019 CLINICAL DATA:  Bruising and swelling of left arm from the biceps to the wrist for past 5 days. Evaluate for DVT. EXAM: LEFT UPPER EXTREMITY VENOUS DOPPLER ULTRASOUND TECHNIQUE: Gray-scale sonography with graded compression, as well as color Doppler and duplex ultrasound were performed to evaluate the upper extremity deep venous system from the level of the subclavian vein and including the jugular, axillary, basilic, radial, ulnar and upper cephalic vein. Spectral Doppler was utilized to evaluate flow at rest and with distal augmentation maneuvers. COMPARISON:  None. FINDINGS: Contralateral Subclavian Vein: Respiratory phasicity is normal and symmetric with the symptomatic side. No evidence of thrombus. Normal compressibility. Internal Jugular Vein: No evidence of thrombus. Normal compressibility, respiratory phasicity and response to augmentation. Subclavian Vein: No evidence of thrombus. Normal compressibility, respiratory phasicity and response to augmentation. Axillary  Vein: No evidence of thrombus. Normal compressibility, respiratory phasicity and response to augmentation. Cephalic Vein: No evidence of thrombus. Normal compressibility, respiratory phasicity and response to augmentation. Basilic Vein: No evidence of thrombus. Normal compressibility, respiratory phasicity and response to augmentation. Brachial Veins: No evidence of thrombus. Normal compressibility, respiratory phasicity and response to augmentation. Radial Veins: No evidence of thrombus. Normal compressibility, respiratory phasicity and response to augmentation. Ulnar Veins: No evidence of thrombus. Normal compressibility, respiratory phasicity and response to augmentation. Venous Reflux:  None visualized. Other Findings: There are 2 adjacent soft tissue nodules at the mid humeral aspect of the left upper arm associated with the adjacent bicipital musculature with dominant nodule measuring approximately 2.9 x 2.1 cm and smaller adjacent nodule measuring 1.3 x 1.6 x 1.1 cm. IMPRESSION: 1. No evidence of DVT within the left upper extremity. 2. Soft tissue nodules at the mid humeral aspect of the left upper arm associated with the bicipital musculature, nonspecific though could represent a bicipital muscular tear (either partial or complete) with associated hematoma. Clinical correlation is advised. Electronically Signed   By: Sandi Mariscal M.D.   On: 10/23/2019 17:00   MR MRV HEAD W WO CONTRAST  Addendum Date: 10/24/2019   ADDENDUM REPORT: 10/24/2019 18:32 ADDENDUM: Study discussed by telephone with Dr. Karleen Hampshire on 10/24/2019 at 1818 hours Electronically Signed   By: Genevie Ann M.D.   On: 10/24/2019 18:32   Result Date: 10/24/2019 CLINICAL DATA:  73 year old male with suspected dural sinus thrombosis. Severe polycythemia, thrombocytosis, persistent headache. Suspicious CTV. EXAM: MRI HEAD WITHOUT AND WITH CONTRAST MR VENOGRAM HEAD WITHOUT AND WITH CONTRAST TECHNIQUE: Multiplanar, multiecho pulse sequences of the brain  and surrounding structures were obtained without and with intravenous contrast. Angiographic images of the intracranial venous structures were obtained using MRV technique without and with intravenous contrast. CONTRAST:  7.23mL GADAVIST GADOBUTROL 1 MMOL/ML IV SOLN COMPARISON:  CT venogram yesterday. FINDINGS: MRI HEAD: Brain: No restricted diffusion or evidence of acute infarction. No midline shift, mass effect, evidence of mass lesion, ventriculomegaly, extra-axial collection or acute intracranial hemorrhage. Cervicomedullary junction and pituitary are within normal limits. Patchy and mild to moderate for age scattered cerebral white matter T2 and FLAIR hyperintensity in a nonspecific configuration. No cortical edema  or encephalomalacia identified. No chronic cerebral blood products. Deep gray nuclei, brainstem and cerebellum appear negative. No abnormal hyperenhancement, no abnormal dural thickening identified. Vascular: Abnormal loss of the portions of the superior sagittal sinus, right sigmoid sinus and IJ bulb flow voids, with associated abnormal venous enhancement in the region which is further detailed below. Major arterial vascular flow voids at the skull base appear preserved. The left vertebral artery appears dominant. Skull and upper cervical spine: Negative visible cervical spine. Heterogeneous bone marrow signal throughout the skull and visible spine although no destructive osseous lesion is identified. There is associated abnormal diffusion signal in the visible skeleton. Sinuses/Orbits: Postoperative changes to both globes. Otherwise negative orbits. Paranasal sinuses are clear. Other: Mild left mastoid effusion appears inconsequential. Visible internal auditory structures appear normal. MRV HEAD: 2D and 3D time-of-flight images demonstrate areas of lost signal abnormality in the posterosuperior sagittal sinus corresponding to heterogeneous enhancement of the sinus. There is absent flow signal in the  right transverse sinus, sigmoid sinus and IJ bulb. There is preserved flow signal in the torcula, straight sinus, vein of Galen, internal cerebral veins, basal veins of Rosenthal as well as non dominant appearing left transverse sinus, left sigmoid sinus, and left IJ bulb. Post-contrast images corroborate areas of posterosuperior sagittal sinus thrombosis (beginning at the vertex series 4, image 120) and continuing through the torcula (image 54). Intermittent thrombosis of the right transverse sinus. More pronounced thrombosis of the right sigmoid sinus continuing to the right IJ bulb (image 16). And also thrombosis of the right paravertebral emissary vein on series 4, image 10. IMPRESSION: 1. Positive for dural venous sinus thrombosis occurring intermittently from the posterosuperior sagittal sinus through the dominant right transverse and sigmoid sinuses, also involving the right IJ bulb and another emissary vein at the right skull base. 2. Negative for associated acute infarct or cerebral edema. 3. Abnormal bone marrow signal suspicious for an infiltrative marrow process in this clinical setting. No destructive osseous lesion identified. 4. Mild to moderate for age nonspecific cerebral white matter signal changes, most commonly due to chronic small vessel disease. Electronically Signed: By: Genevie Ann M.D. On: 10/24/2019 18:09   CT VENOGRAM HEAD  Result Date: 10/23/2019 CLINICAL DATA:  Dural venous sinus thrombosis suspected, several weeks of headaches, platelets over 1 million, concern for venous sinus thrombosis versus bleed. EXAM: CT VENOGRAM HEAD TECHNIQUE: Noncontrast head CT was performed. Coronal and sagittal reconstructions were also submitted for evaluation. Postcontrast CT venography of the head was also performed. Coronal and sagittal reconstructions as well as MIP reconstructions were submitted for evaluation. CONTRAST:  18mL OMNIPAQUE IOHEXOL 300 MG/ML  SOLN COMPARISON:  No pertinent prior studies  available for comparison. FINDINGS: There is no evidence of acute intracranial hemorrhage. No demarcated cortical infarction. No evidence of intracranial mass. No midline shift or extra-axial fluid collection. Moderate patchy hypodensity within the cerebral white matter is nonspecific, but consistent with chronic small vessel ischemic disease. Mild generalized parenchymal atrophy. There is paucity of enhancement within portions of the right sigmoid sinus concerning for possible dural venous sinus thrombosis (for instance as seen on series 13, images 13-24 and series 7, images 29-30). The superior sagittal sinus is diminutive inferiorly, although patent without without convincing evidence of dural venous sinus thrombosis. The internal cerebral veins, vein of Galen, straight sinus, bilateral transverse and left sigmoid sinuses are patent. No hyperdense vessel. No calvarial fracture or suspicious osseous lesion. Visualized orbits demonstrate no acute abnormality. Minimal ethmoid sinus mucosal thickening. No significant  mastoid effusion. These results were called by telephone at the time of interpretation on 10/23/2019 at 8:14 pm to provider [facility] , who verbally acknowledged these results. IMPRESSION: 1. Paucity of enhancement within portions of the right sigmoid sinus concerning for possible dural venous sinus thrombosis. MRI and MRV head with and without contrast is recommended for further evaluation. 2. No evidence of intracranial hemorrhage or demarcated infarct. 3. Mild generalized parenchymal atrophy and chronic small vessel ischemic disease. Electronically Signed   By: Kellie Simmering DO   On: 10/23/2019 20:15    ASSESSMENT & PLAN:  #1Severe thrombocytosis with platelets of 1005k. #2 leukocytosis/primary neutrophilia. #3 polycythemia with a hematocrit of 51.7. #4 Dural venous sinus thrombosis -likely triggered by his polycythemia and thrombocytosis. #5 left arm hematoma likely related to a  bicipital tendon injury. 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  PLAN: -Discussed pt labwork today, 11/03/19; blood counts continue to improve, PLTs down to 556K, blood chemistries are steady -Discussed 10/24/2019 JAK2 GenotypR is "POSITIVE for the detection of the V617F mutation" -Discussed 10/24/2019 CALR Mutation analysis is "Negative"  -Discussed 10/24/2019 BCRAL is "Negative"  -Pt has JAK2 positive PCV -Advised pt take 25 mg Hydralazine every 12 hours and monitor BP at home -The pt has no prohibitive toxicities from continuing Hydoxyurea 500 mg BID with meals. -Continue Eliquis 5 mg BID -Goal HCT <45%, at 47.7% on 11/03/2019  -Pt is scheduled for a therapeutic phlebotomy on 12/16 -Advised pt that he will be iron deficient, which will help control his PCV -Recommended pt avoid iron supplements, and an increase in iron intake will increase the need for phlebotomies -Recommended that the pt continue to eat well and drink at least 48-64 oz of water each day -Recommended pt avoid lifting, pulling, pushing with his left arm  -Recommended pt use a warm compress, compression sleeve, and elevate left arm  -Recommended OTC Maalox for nausea -Recommended Magnesium Citrate 1/2 bottle x1, may repeat if no bowel movements in 3-4 hours.  -Recommended pt f/u with PCP for Hydralazine evaluation, PT referral  -Recommended pt f/u with Neurologist for Nortriptyline opitimization -Rx Nortriptyline -Will see back in 3 weeks with labs and therapeutic phlebotomy   FOLLOW UP: Therapeutic phlebotomy tomorrow as scheduled RTC with Dr Irene Limbo with labs and therapeutic phlebotomy in 3 weeks  All of the patients questions were answered with apparent satisfaction.  The patient knows to call the clinic with any problems, questions or concerns.  I spent 30 mins counseling the patient face to face. The total time spent in the appointment was 40 minutes and more than 50% was on counseling and direct patient cares.    Sullivan Lone MD [place] AAHIVMS [facility] Hematology/Oncology Physician [facility]  (Office):       [phone] (Work cell):  [phone] (Fax):           [phone]  11/03/2019 12:24 PM  I, Yevette Edwards, am acting as a scribe for Dr. Sullivan Lone.   .I have reviewed the above documentation for accuracy and completeness, and I agree with the above. Brunetta Genera MD

## 2019-11-03 ENCOUNTER — Inpatient Hospital Stay: Payer: Medicare Other

## 2019-11-03 ENCOUNTER — Inpatient Hospital Stay: Payer: Medicare Other | Attending: Hematology | Admitting: Hematology

## 2019-11-03 ENCOUNTER — Other Ambulatory Visit: Payer: Self-pay

## 2019-11-03 VITALS — BP 128/79 | HR 94 | Temp 98.2°F | Resp 18 | Ht 71.0 in | Wt 147.4 lb

## 2019-11-03 DIAGNOSIS — Z87891 Personal history of nicotine dependence: Secondary | ICD-10-CM | POA: Diagnosis not present

## 2019-11-03 DIAGNOSIS — D471 Chronic myeloproliferative disease: Secondary | ICD-10-CM

## 2019-11-03 DIAGNOSIS — G08 Intracranial and intraspinal phlebitis and thrombophlebitis: Secondary | ICD-10-CM | POA: Insufficient documentation

## 2019-11-03 DIAGNOSIS — D473 Essential (hemorrhagic) thrombocythemia: Secondary | ICD-10-CM | POA: Diagnosis not present

## 2019-11-03 DIAGNOSIS — X58XXXA Exposure to other specified factors, initial encounter: Secondary | ICD-10-CM | POA: Diagnosis not present

## 2019-11-03 DIAGNOSIS — D75839 Thrombocytosis, unspecified: Secondary | ICD-10-CM

## 2019-11-03 DIAGNOSIS — S40022A Contusion of left upper arm, initial encounter: Secondary | ICD-10-CM | POA: Diagnosis not present

## 2019-11-03 DIAGNOSIS — D72829 Elevated white blood cell count, unspecified: Secondary | ICD-10-CM | POA: Diagnosis not present

## 2019-11-03 DIAGNOSIS — D759 Disease of blood and blood-forming organs, unspecified: Secondary | ICD-10-CM | POA: Insufficient documentation

## 2019-11-03 LAB — CMP (CANCER CENTER ONLY)
ALT: 22 U/L (ref 0–44)
AST: 32 U/L (ref 15–41)
Albumin: 3.7 g/dL (ref 3.5–5.0)
Alkaline Phosphatase: 127 U/L — ABNORMAL HIGH (ref 38–126)
Anion gap: 9 (ref 5–15)
BUN: 23 mg/dL (ref 8–23)
CO2: 27 mmol/L (ref 22–32)
Calcium: 9.2 mg/dL (ref 8.9–10.3)
Chloride: 102 mmol/L (ref 98–111)
Creatinine: 1.43 mg/dL — ABNORMAL HIGH (ref 0.61–1.24)
GFR, Est AFR Am: 56 mL/min — ABNORMAL LOW (ref >60)
GFR, Estimated: 49 mL/min — ABNORMAL LOW (ref >60)
Glucose, Bld: 118 mg/dL — ABNORMAL HIGH (ref 70–99)
Potassium: 3.7 mmol/L (ref 3.5–5.1)
Sodium: 138 mmol/L (ref 135–145)
Total Bilirubin: 0.8 mg/dL (ref 0.3–1.2)
Total Protein: 7 g/dL (ref 6.5–8.1)

## 2019-11-03 LAB — CBC WITH DIFFERENTIAL (CANCER CENTER ONLY)
Abs Immature Granulocytes: 0.02 10*3/uL (ref 0.00–0.07)
Basophils Absolute: 0.2 10*3/uL — ABNORMAL HIGH (ref 0.0–0.1)
Basophils Relative: 5 %
Eosinophils Absolute: 0.1 10*3/uL (ref 0.0–0.5)
Eosinophils Relative: 2 %
HCT: 47.4 % (ref 39.0–52.0)
Hemoglobin: 13.8 g/dL (ref 13.0–17.0)
Immature Granulocytes: 1 %
Lymphocytes Relative: 15 %
Lymphs Abs: 0.6 10*3/uL — ABNORMAL LOW (ref 0.7–4.0)
MCH: 19.9 pg — ABNORMAL LOW (ref 26.0–34.0)
MCHC: 29.1 g/dL — ABNORMAL LOW (ref 30.0–36.0)
MCV: 68.3 fL — ABNORMAL LOW (ref 80.0–100.0)
Monocytes Absolute: 0.2 10*3/uL (ref 0.1–1.0)
Monocytes Relative: 5 %
Neutro Abs: 2.8 10*3/uL (ref 1.7–7.7)
Neutrophils Relative %: 72 %
Platelet Count: 556 10*3/uL — ABNORMAL HIGH (ref 150–400)
RBC: 6.94 MIL/uL — ABNORMAL HIGH (ref 4.22–5.81)
RDW: 27.4 % — ABNORMAL HIGH (ref 11.5–15.5)
WBC Count: 4 10*3/uL (ref 4.0–10.5)
nRBC: 2.3 % — ABNORMAL HIGH (ref 0.0–0.2)

## 2019-11-03 MED ORDER — HYDRALAZINE HCL 25 MG PO TABS: 25.0000 mg | ORAL_TABLET | Freq: Two times a day (BID) | ORAL | 0 refills | Status: DC

## 2019-11-03 MED ORDER — HYDROXYUREA 500 MG PO CAPS: 500.0000 mg | ORAL_CAPSULE | Freq: Two times a day (BID) | ORAL | 1 refills | Status: DC

## 2019-11-03 MED ORDER — NORTRIPTYLINE HCL 10 MG PO CAPS: 10.0000 mg | ORAL_CAPSULE | Freq: Every day | ORAL | 1 refills | Status: DC

## 2019-11-03 MED ORDER — MAGNESIUM CITRATE PO SOLN: 0.5000 | Freq: Once | ORAL | 0 refills | Status: DC | PRN

## 2019-11-03 MED ORDER — APIXABAN 5 MG PO TABS: 5.0000 mg | ORAL_TABLET | Freq: Two times a day (BID) | ORAL | 2 refills | Status: DC

## 2019-11-03 NOTE — Patient Instructions (Signed)
Magnesium Citrate 1/2 bottle x1, may repeat if no bowel movements in 3-4 hours.

## 2019-11-04 ENCOUNTER — Inpatient Hospital Stay: Payer: Medicare Other

## 2019-11-04 ENCOUNTER — Telehealth: Payer: Self-pay | Admitting: Hematology

## 2019-11-04 ENCOUNTER — Other Ambulatory Visit: Payer: Self-pay

## 2019-11-04 VITALS — BP 107/68 | HR 82 | Temp 98.4°F | Resp 16

## 2019-11-04 DIAGNOSIS — D45 Polycythemia vera: Secondary | ICD-10-CM

## 2019-11-04 DIAGNOSIS — D473 Essential (hemorrhagic) thrombocythemia: Secondary | ICD-10-CM | POA: Diagnosis not present

## 2019-11-04 NOTE — Patient Instructions (Signed)
Therapeutic Phlebotomy Therapeutic phlebotomy is the planned removal of blood from a person's body for the purpose of treating a medical condition. The procedure is similar to donating blood. Usually, about a pint (470 mL, or 0.47 L) of blood is removed. The average adult has 9-12 pints (4.3-5.7 L) of blood in the body. Therapeutic phlebotomy may be used to treat the following medical conditions:  Hemochromatosis. This is a condition in which the blood contains too much iron.  Polycythemia vera. This is a condition in which the blood contains too many red blood cells.  Porphyria cutanea tarda. This is a disease in which an important part of hemoglobin is not made properly. It results in the buildup of abnormal amounts of porphyrins in the body.  Sickle cell disease. This is a condition in which the red blood cells form an abnormal crescent shape rather than a round shape. Tell a health care provider about:  Any allergies you have.  All medicines you are taking, including vitamins, herbs, eye drops, creams, and over-the-counter medicines.  Any problems you or family members have had with anesthetic medicines.  Any blood disorders you have.  Any surgeries you have had.  Any medical conditions you have.  Whether you are pregnant or may be pregnant. What are the risks? Generally, this is a safe procedure. However, problems may occur, including:  Nausea or light-headedness.  Low blood pressure (hypotension).  Soreness, bleeding, swelling, or bruising at the needle insertion site.  Infection. What happens before the procedure?  Follow instructions from your health care provider about eating or drinking restrictions.  Ask your health care provider about: ? Changing or stopping your regular medicines. This is especially important if you are taking diabetes medicines or blood thinners (anticoagulants). ? Taking medicines such as aspirin and ibuprofen. These medicines can thin your  blood. Do not take these medicines unless your health care provider tells you to take them. ? Taking over-the-counter medicines, vitamins, herbs, and supplements.  Wear clothing with sleeves that can be raised above the elbow.  Plan to have someone take you home from the hospital or clinic.  You may have a blood sample taken.  Your blood pressure, pulse rate, and breathing rate will be measured. What happens during the procedure?   To lower your risk of infection: ? Your health care team will wash or sanitize their hands. ? Your skin will be cleaned with an antiseptic.  You may be given a medicine to numb the area (local anesthetic).  A tourniquet will be placed on your arm.  A needle will be inserted into one of your veins.  Tubing and a collection bag will be attached to that needle.  Blood will flow through the needle and tubing into the collection bag.  The collection bag will be placed lower than your arm to allow gravity to help the flow of blood into the bag.  You may be asked to open and close your hand slowly and continually during the entire collection.  After the specified amount of blood has been removed from your body, the collection bag and tubing will be clamped.  The needle will be removed from your vein.  Pressure will be held on the site of the needle insertion to stop the bleeding.  A bandage (dressing) will be placed over the needle insertion site. The procedure may vary among health care providers and hospitals. What happens after the procedure?  Your blood pressure, pulse rate, and breathing rate will be   measured after the procedure.  You will be encouraged to drink fluids.  Your recovery will be assessed and monitored.  You can return to your normal activities as told by your health care provider. Summary  Therapeutic phlebotomy is the planned removal of blood from a person's body for the purpose of treating a medical condition.  Therapeutic  phlebotomy may be used to treat hemochromatosis, polycythemia vera, porphyria cutanea tarda, or sickle cell disease.  In the procedure, a needle is inserted and about a pint (470 mL, or 0.47 L) of blood is removed. The average adult has 9-12 pints (4.3-5.7 L) of blood in the body.  This is generally a safe procedure, but it can sometimes cause problems such as nausea, light-headedness, or low blood pressure (hypotension). This information is not intended to replace advice given to you by your health care provider. Make sure you discuss any questions you have with your health care provider. Document Released: 04/09/2011 Document Revised: 11/21/2017 Document Reviewed: 11/21/2017 Elsevier Patient Education  2020 Elsevier Inc.  

## 2019-11-04 NOTE — Progress Notes (Signed)
Pt phlebotomized with 16 gauge needle in right AC x 1 attempt. Phlebotomy started at 0858 and ended at 0906.  516 grams removed. Drink provided.  Snack declined. VSS Pt discharged to home without complication.

## 2019-11-04 NOTE — Progress Notes (Signed)
Verbal order given by Dr Irene Limbo to perform phlebotomy.

## 2019-11-04 NOTE — Telephone Encounter (Signed)
Scheduled appt per 12/15 los.  Spoke with pt and he is aware of the appt date and time.

## 2019-11-25 ENCOUNTER — Inpatient Hospital Stay: Payer: Medicare PPO | Attending: Hematology

## 2019-11-25 ENCOUNTER — Inpatient Hospital Stay: Payer: Medicare PPO

## 2019-11-25 ENCOUNTER — Inpatient Hospital Stay (HOSPITAL_BASED_OUTPATIENT_CLINIC_OR_DEPARTMENT_OTHER): Payer: Medicare PPO | Admitting: Hematology

## 2019-11-25 ENCOUNTER — Other Ambulatory Visit: Payer: Self-pay

## 2019-11-25 VITALS — BP 130/80 | HR 90 | Temp 98.0°F | Resp 17 | Ht 71.0 in | Wt 147.6 lb

## 2019-11-25 DIAGNOSIS — D471 Chronic myeloproliferative disease: Secondary | ICD-10-CM

## 2019-11-25 DIAGNOSIS — Z7901 Long term (current) use of anticoagulants: Secondary | ICD-10-CM | POA: Insufficient documentation

## 2019-11-25 DIAGNOSIS — D75839 Thrombocytosis, unspecified: Secondary | ICD-10-CM

## 2019-11-25 DIAGNOSIS — D473 Essential (hemorrhagic) thrombocythemia: Secondary | ICD-10-CM

## 2019-11-25 DIAGNOSIS — D45 Polycythemia vera: Secondary | ICD-10-CM

## 2019-11-25 DIAGNOSIS — I82890 Acute embolism and thrombosis of other specified veins: Secondary | ICD-10-CM | POA: Diagnosis not present

## 2019-11-25 DIAGNOSIS — D72829 Elevated white blood cell count, unspecified: Secondary | ICD-10-CM | POA: Insufficient documentation

## 2019-11-25 DIAGNOSIS — D751 Secondary polycythemia: Secondary | ICD-10-CM | POA: Insufficient documentation

## 2019-11-25 DIAGNOSIS — Z79899 Other long term (current) drug therapy: Secondary | ICD-10-CM | POA: Insufficient documentation

## 2019-11-25 DIAGNOSIS — S40022A Contusion of left upper arm, initial encounter: Secondary | ICD-10-CM | POA: Diagnosis not present

## 2019-11-25 DIAGNOSIS — E785 Hyperlipidemia, unspecified: Secondary | ICD-10-CM | POA: Insufficient documentation

## 2019-11-25 LAB — CMP (CANCER CENTER ONLY)
ALT: 15 U/L (ref 0–44)
AST: 26 U/L (ref 15–41)
Albumin: 4 g/dL (ref 3.5–5.0)
Alkaline Phosphatase: 102 U/L (ref 38–126)
Anion gap: 8 (ref 5–15)
BUN: 23 mg/dL (ref 8–23)
CO2: 30 mmol/L (ref 22–32)
Calcium: 9.1 mg/dL (ref 8.9–10.3)
Chloride: 103 mmol/L (ref 98–111)
Creatinine: 1.34 mg/dL — ABNORMAL HIGH (ref 0.61–1.24)
GFR, Est AFR Am: >60 mL/min (ref >60)
GFR, Estimated: 53 mL/min — ABNORMAL LOW (ref >60)
Glucose, Bld: 81 mg/dL (ref 70–99)
Potassium: 4.5 mmol/L (ref 3.5–5.1)
Sodium: 141 mmol/L (ref 135–145)
Total Bilirubin: 0.6 mg/dL (ref 0.3–1.2)
Total Protein: 7 g/dL (ref 6.5–8.1)

## 2019-11-25 LAB — CBC WITH DIFFERENTIAL/PLATELET
Abs Immature Granulocytes: 0.04 10*3/uL (ref 0.00–0.07)
Basophils Absolute: 0.2 10*3/uL — ABNORMAL HIGH (ref 0.0–0.1)
Basophils Relative: 4 %
Eosinophils Absolute: 0.1 10*3/uL (ref 0.0–0.5)
Eosinophils Relative: 3 %
HCT: 43.8 % (ref 39.0–52.0)
Hemoglobin: 13.2 g/dL (ref 13.0–17.0)
Immature Granulocytes: 1 %
Lymphocytes Relative: 22 %
Lymphs Abs: 0.9 10*3/uL (ref 0.7–4.0)
MCH: 23.3 pg — ABNORMAL LOW (ref 26.0–34.0)
MCHC: 30.1 g/dL (ref 30.0–36.0)
MCV: 77.2 fL — ABNORMAL LOW (ref 80.0–100.0)
Monocytes Absolute: 0.7 10*3/uL (ref 0.1–1.0)
Monocytes Relative: 15 %
Neutro Abs: 2.4 10*3/uL (ref 1.7–7.7)
Neutrophils Relative %: 55 %
Platelets: 162 10*3/uL (ref 150–400)
RBC: 5.67 MIL/uL (ref 4.22–5.81)
RDW: 33.9 % — ABNORMAL HIGH (ref 11.5–15.5)
WBC: 4.3 10*3/uL (ref 4.0–10.5)
nRBC: 0 % (ref 0.0–0.2)

## 2019-11-25 LAB — LACTATE DEHYDROGENASE: LDH: 199 U/L — ABNORMAL HIGH (ref 98–192)

## 2019-11-25 MED ORDER — HYDROXYUREA 500 MG PO CAPS: ORAL_CAPSULE | ORAL | 3 refills | Status: DC

## 2019-11-25 NOTE — Progress Notes (Signed)
HEMATOLOGY/ONCOLOGY CONSULTATION NOTE  Date of Service: 11/25/2019  Patient Care Team: [name], MD as PCP - General (Family Medicine)  CHIEF COMPLAINTS/PURPOSE OF CONSULTATION:  Concern for a myeloproliferative neoplasm likely polycythemia vera or essential thrombocytosis  HISTORY OF PRESENTING ILLNESS:  Dillon Hill is a wonderful 74 y.o. male with medical history significant for migraine headaches benign prostatic hypertrophy and hyperlipidemia who presented to the [facility] with large hematoma to left arm.  He was noted to have thrombocytopenia.  Other concerns will be noted.  He has been transferred for further work-up here ED Course: In the emergency room patient had work-up including including which was concerning for possible dural venous sinus thrombosis and MRI with and without contrast and MRV to evaluate was slated to be done at [facility] long. Patient had no significant symptoms of any kind.   INTERVAL HISTORY:  Dillon Hill is a wonderful 74 y.o. male is here for the evaluation and management of polycythemia vera. The patient's last visit with [name] was on 11/03/2019. The pt reports that he is doing well overall.  The pt reports that he has not been having headaches and denies any bleeding concerns. Pt has a BP cuff at home. He denies any issues taking Hydroxyurea and has continued to take it with food. Pt has noticed some weight loss, although he is eating well and has not increased his level of activity. He is interested in getting back to the gym but is hesitant at this time due to the pandemic. Pt has set up primary care and has had a virtual meeting but has yet to have an in-person visit.   Lab results today (11/25/19) of CBC w/diff and CMP is as follows: all values are WNL except for MCV at 77.2, MCH at 23.3, RDW at 33.9, Baso Abs at 0.2K, Creatinine at 1.34, GFR Est Non Af Am at 53. 11/25/2019 LDH at 199  On review of systems, pt reports unexpected  weight loss and denies headaches, bleeding concerns, bruising, vision changes, chest pain, SOB and any other symptoms.   MEDICAL HISTORY:  Past Medical History:  Diagnosis Date  . BPH (benign prostatic hyperplasia)   . Colon polyp   . DDD (degenerative disc disease), cervical   . High cholesterol   . Migraine   . Polycythemia vera ([place])     SURGICAL HISTORY: Past Surgical History:  Procedure Laterality Date  . EYE SURGERY      SOCIAL HISTORY: Social History   Socioeconomic History  . Marital status: Married    Spouse name: Not on file  . Number of children: Not on file  . Years of education: Not on file  . Highest education level: Not on file  Occupational History  . Not on file  Tobacco Use  . Smoking status: Never Smoker  . Smokeless tobacco: Never Used  . Tobacco comment: smoked x 1 year at age 75  Substance and Sexual Activity  . Alcohol use: Yes    Comment: occ  . Drug use: Never  . Sexual activity: Not on file  Other Topics Concern  . Not on file  Social History Narrative  . Not on file   Social Determinants of Health   Financial Resource Strain:   . Difficulty of Paying Living Expenses: Not on file  Food Insecurity:   . Worried About Charity fundraiser in the Last Year: Not on file  . Ran Out of Food in the Last Year:  Not on file  Transportation Needs:   . Lack of Transportation (Medical): Not on file  . Lack of Transportation (Non-Medical): Not on file  Physical Activity:   . Days of Exercise per Week: Not on file  . Minutes of Exercise per Session: Not on file  Stress:   . Feeling of Stress : Not on file  Social Connections:   . Frequency of Communication with Friends and Family: Not on file  . Frequency of Social Gatherings with Friends and Family: Not on file  . Attends Religious Services: Not on file  . Active Member of Clubs or Organizations: Not on file  . Attends Archivist Meetings: Not on file  . Marital Status: Not on file    Intimate Partner Violence:   . Fear of Current or Ex-Partner: Not on file  . Emotionally Abused: Not on file  . Physically Abused: Not on file  . Sexually Abused: Not on file    FAMILY HISTORY: No family history on file.  ALLERGIES:  has No Known Allergies.  MEDICATIONS:  Current Outpatient Medications  Medication Sig Dispense Refill  . amLODipine (NORVASC) 10 MG tablet Take 1 tablet (10 mg total) by mouth daily. 30 tablet 0  . apixaban (ELIQUIS) 5 MG TABS tablet Take 1 tablet (5 mg total) by mouth 2 (two) times daily. 60 tablet 2  . hydrALAZINE (APRESOLINE) 25 MG tablet Take 1 tablet (25 mg total) by mouth every 12 (twelve) hours. 90 tablet 0  . hydroxyurea (HYDREA) 500 MG capsule 2 tabs (1000 mg) po once daily on Monday/wednesday and Friday and 1 tab (500mg ) po once daily on Tuesday/thursday/saturday/sunday.May take with food to minimize GI side effects. 60 capsule 3  . Multiple Vitamin (MULTIVITAMIN WITH MINERALS) TABS tablet Take 1 tablet by mouth daily.    . nortriptyline (PAMELOR) 10 MG capsule Take 1 capsule (10 mg total) by mouth at bedtime. 30 capsule 1  . SUMAtriptan (IMITREX) 100 MG tablet Take 100 mg by mouth as needed for migraine or headache.   1   No current facility-administered medications for this visit.    REVIEW OF SYSTEMS:   A 10+ POINT REVIEW OF SYSTEMS WAS OBTAINED including neurology, dermatology, psychiatry, cardiac, respiratory, lymph, extremities, GI, GU, Musculoskeletal, constitutional, breasts, reproductive, HEENT.  All pertinent positives are noted in the HPI.  All others are negative.   PHYSICAL EXAMINATION: ECOG PERFORMANCE STATUS: 2 - Symptomatic, <50% confined to bed  . Vitals:   11/25/19 1301  BP: 130/80  Pulse: 90  Resp: 17  Temp: 98 F (36.7 C)  SpO2: 100%   Filed Weights   11/25/19 1301  Weight: 147 lb 9.6 oz (67 kg)   .Body mass index is 20.59 kg/m.   GENERAL:alert, in no acute distress and comfortable SKIN: no acute rashes,  no significant lesions EYES: conjunctiva are pink and non-injected, sclera anicteric OROPHARYNX: MMM, no exudates, no oropharyngeal erythema or ulceration NECK: supple, no JVD LYMPH:  no palpable lymphadenopathy in the cervical, axillary or inguinal regions LUNGS: clear to auscultation b/l with normal respiratory effort HEART: regular rate & rhythm ABDOMEN:  normoactive bowel sounds , non tender, not distended. No palpable hepatosplenomegaly.  Extremity: no pedal edema PSYCH: alert & oriented x 3 with fluent speech NEURO: no focal motor/sensory deficits  LABORATORY DATA:  I have reviewed the data as listed  . CBC Latest Ref Rng & Units 11/25/2019 11/03/2019 10/27/2019  WBC 4.0 - 10.5 K/uL 4.3 4.0 19.1(H)  Hemoglobin 13.0 -  17.0 g/dL 13.2 13.8 13.1  Hematocrit 39.0 - 52.0 % 43.8 47.4 47.4  Platelets 150 - 400 K/uL 162 556(H) 864(H)    . CMP Latest Ref Rng & Units 11/25/2019 11/03/2019 10/26/2019  Glucose 70 - 99 mg/dL 81 118(H) 110(H)  BUN 8 - 23 mg/dL 23 23 21   Creatinine 0.61 - 1.24 mg/dL 1.34(H) 1.43(H) 1.40(H)  Sodium 135 - 145 mmol/L 141 138 137  Potassium 3.5 - 5.1 mmol/L 4.5 3.7 3.9  Chloride 98 - 111 mmol/L 103 102 103  CO2 22 - 32 mmol/L 30 27 22   Calcium 8.9 - 10.3 mg/dL 9.1 9.2 9.0  Total Protein 6.5 - 8.1 g/dL 7.0 7.0 6.4(L)  Total Bilirubin 0.3 - 1.2 mg/dL 0.6 0.8 1.1  Alkaline Phos 38 - 126 U/L 102 127(H) 127(H)  AST 15 - 41 U/L 26 32 26  ALT 0 - 44 U/L 15 22 13    10/24/2019 JAK2 mutation testing:    RADIOGRAPHIC STUDIES: I have personally reviewed the radiological images as listed and agreed with the findings in the report. No results found.  ASSESSMENT & PLAN:  #1Severe thrombocytosis with platelets of 1005k. 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 #3 polycythemia with a hematocrit of 51.7. 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 #5 left arm hematoma likely related to a bicipital tendon injury. 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 10/24/2019 CALR Mutation analysis is "Negative"  10/24/2019 BCRAL is "Negative"   PLAN: -Discussed pt labwork today, 11/25/19; HCT is WNL, Platelets are well controlled, blood chemistries are stable -Discussed 11/25/2019 LDH is improving, down to 199 -Goal HCT <45%, at 43.8% on 11/25/2019 -No indication for a therapeutic phlebotomy at this time  -Continue Eliquis 5 mg BID -Recommended pt avoid iron supplements, and an increase in iron intake will increase the need for phlebotomies -Recommended that the pt continue to eat well, drink at least 48-64 oz of water each day, and walk 20-30 minutes each day.  -Recommended pt monitor BP at home for a few weeks before discussing Amlodipine dosage with PCP -The pt has no prohibitive toxicities from continuing Hydoxyurea 500 mg - 2 pills M,W,F and 1 pill T, Th, Sat, Sun with meals. (dose reduced from 1000 mg po daily) -Will see back in 1 month with labs  FOLLOW UP: RTC with Dr Irene Limbo with labs and therapeutic phlebotomy appointment in 1 month   The total time spent in the appt was 41minutes and more than 50% was on counseling and direct patient cares.  All of the patient's questions were answered with apparent satisfaction. The patient knows to call the clinic with any problems, questions or concerns.    Sullivan Lone MD [place] AAHIVMS [facility] Hematology/Oncology Physician [facility]  (Office):       [phone] (Work cell):  [phone] (Fax):           [phone]  11/25/2019 2:08 PM  I, Yevette Edwards, am acting as a scribe for Dr. Sullivan Lone.   .I have  reviewed the above documentation for accuracy and completeness, and I agree with the above. Brunetta Genera MD

## 2019-11-26 ENCOUNTER — Encounter: Payer: Self-pay | Admitting: Hematology

## 2019-11-26 ENCOUNTER — Telehealth: Payer: Self-pay | Admitting: Hematology

## 2019-11-26 NOTE — Telephone Encounter (Signed)
Scheduled appt per 1/6 los.  Sent a message to HIM pool to get a calendar mailed out. 

## 2019-11-27 ENCOUNTER — Encounter: Payer: Self-pay | Admitting: *Deleted

## 2019-12-16 ENCOUNTER — Encounter: Payer: Self-pay | Admitting: Hematology

## 2019-12-17 ENCOUNTER — Ambulatory Visit: Payer: Medicare PPO

## 2019-12-25 ENCOUNTER — Ambulatory Visit: Payer: Medicare PPO

## 2019-12-28 ENCOUNTER — Inpatient Hospital Stay: Payer: Medicare PPO

## 2019-12-28 ENCOUNTER — Other Ambulatory Visit: Payer: Self-pay

## 2019-12-28 ENCOUNTER — Inpatient Hospital Stay: Payer: Medicare PPO | Attending: Hematology | Admitting: Hematology

## 2019-12-28 VITALS — BP 120/74 | HR 86 | Temp 98.0°F | Resp 18 | Ht 71.0 in | Wt 154.6 lb

## 2019-12-28 DIAGNOSIS — D72829 Elevated white blood cell count, unspecified: Secondary | ICD-10-CM | POA: Insufficient documentation

## 2019-12-28 DIAGNOSIS — D45 Polycythemia vera: Secondary | ICD-10-CM | POA: Insufficient documentation

## 2019-12-28 DIAGNOSIS — M7981 Nontraumatic hematoma of soft tissue: Secondary | ICD-10-CM | POA: Insufficient documentation

## 2019-12-28 DIAGNOSIS — D473 Essential (hemorrhagic) thrombocythemia: Secondary | ICD-10-CM | POA: Diagnosis not present

## 2019-12-28 DIAGNOSIS — I676 Nonpyogenic thrombosis of intracranial venous system: Secondary | ICD-10-CM | POA: Insufficient documentation

## 2019-12-28 DIAGNOSIS — D696 Thrombocytopenia, unspecified: Secondary | ICD-10-CM

## 2019-12-28 LAB — RETICULOCYTES
Immature Retic Fract: 4.6 % (ref 2.3–15.9)
RBC.: 3.89 MIL/uL — ABNORMAL LOW (ref 4.22–5.81)
Retic Count, Absolute: 6.6 10*3/uL — ABNORMAL LOW (ref 19.0–186.0)
Retic Ct Pct: <0.4 % — ABNORMAL LOW (ref 0.4–3.1)

## 2019-12-28 LAB — CMP (CANCER CENTER ONLY)
ALT: 19 U/L (ref 0–44)
AST: 26 U/L (ref 15–41)
Albumin: 3.8 g/dL (ref 3.5–5.0)
Alkaline Phosphatase: 110 U/L (ref 38–126)
Anion gap: 7 (ref 5–15)
BUN: 27 mg/dL — ABNORMAL HIGH (ref 8–23)
CO2: 28 mmol/L (ref 22–32)
Calcium: 9.1 mg/dL (ref 8.9–10.3)
Chloride: 106 mmol/L (ref 98–111)
Creatinine: 1.32 mg/dL — ABNORMAL HIGH (ref 0.61–1.24)
GFR, Est AFR Am: >60 mL/min (ref >60)
GFR, Estimated: 54 mL/min — ABNORMAL LOW (ref >60)
Glucose, Bld: 71 mg/dL (ref 70–99)
Potassium: 4.3 mmol/L (ref 3.5–5.1)
Sodium: 141 mmol/L (ref 135–145)
Total Bilirubin: 0.7 mg/dL (ref 0.3–1.2)
Total Protein: 6.8 g/dL (ref 6.5–8.1)

## 2019-12-28 LAB — CBC WITH DIFFERENTIAL/PLATELET
Abs Immature Granulocytes: 0.01 10*3/uL (ref 0.00–0.07)
Basophils Absolute: 0 10*3/uL (ref 0.0–0.1)
Basophils Relative: 2 %
Eosinophils Absolute: 0.2 10*3/uL (ref 0.0–0.5)
Eosinophils Relative: 9 %
HCT: 34.8 % — ABNORMAL LOW (ref 39.0–52.0)
Hemoglobin: 10.9 g/dL — ABNORMAL LOW (ref 13.0–17.0)
Immature Granulocytes: 1 %
Lymphocytes Relative: 42 %
Lymphs Abs: 0.9 10*3/uL (ref 0.7–4.0)
MCH: 27.5 pg (ref 26.0–34.0)
MCHC: 31.3 g/dL (ref 30.0–36.0)
MCV: 87.9 fL (ref 80.0–100.0)
Monocytes Absolute: 0.2 10*3/uL (ref 0.1–1.0)
Monocytes Relative: 10 %
Neutro Abs: 0.7 10*3/uL — ABNORMAL LOW (ref 1.7–7.7)
Neutrophils Relative %: 36 %
Platelets: 15 10*3/uL — ABNORMAL LOW (ref 150–400)
RBC: 3.96 MIL/uL — ABNORMAL LOW (ref 4.22–5.81)
WBC: 2 10*3/uL — ABNORMAL LOW (ref 4.0–10.5)
nRBC: 0 % (ref 0.0–0.2)

## 2019-12-28 NOTE — Progress Notes (Signed)
HEMATOLOGY/ONCOLOGY CLINIC NOTE  Date of Service: 12/28/2019  Patient Care Team: [name], MD as PCP - General (Family Medicine)  CHIEF COMPLAINTS/PURPOSE OF CONSULTATION:  Concern for a myeloproliferative neoplasm likely polycythemia vera or essential thrombocytosis  HISTORY OF PRESENTING ILLNESS:  Dillon Hill is a wonderful 74 y.o. male with medical history significant for migraine headaches benign prostatic hypertrophy and hyperlipidemia who presented to the [facility] in [facility] with large hematoma to left arm.  He was noted to have thrombocytopenia.  Other concerns will be noted.  He has been transferred for further work-up here ED Course: In the emergency room patient had work-up including including which was concerning for possible dural venous sinus thrombosis and MRI with and without contrast and MRV to evaluate was slated to be done at [facility] long. Patient had no significant symptoms of any kind.   INTERVAL HISTORY:   Dillon Hill is a wonderful 74 y.o. male is here for the evaluation and management of polycythemia vera. The patient's last visit with [name] was on 11/25/2019. The pt reports that he is doing well overall.  The pt reports that he has been well in the interim and has had no new concerns. He denies any headaches or vision changes. He has not needed to use any medication to treat his migraines lately. Pt does have occasional nosebleeds but denies any other bleeding concerns while on Eliquis. Pt has had no issues with his current dose of Hydroxyurea. He has been remaining active by running up to a mile per day. He has not been experiencing constipation but notes that it can take awhile for him to move his bowels. Pt currently takes a Centrum multivitamin daily. He has had his first dose of the COVID19 vaccine and has the second dose scheduled.  Lab results today (12/28/19) of CBC w/diff and CMP is as follows: all values are WNL except for WBC at 2.0K, RBC at  3.96, Hgb at 10.9, HCT at 34.8, PLT at 15K, Neutro Abs at 0.7K, BUN at 27, Creatinine at 1.32, GFR Est Non Af Am at 54. 12/28/2019 Reticulocytes is as follows: Retic Ct Pct at <0.4, RBC at 3.89, Retic Count Abs at 6.6, Immature Retic Fract at 4.6.  On review of systems, pt reports nose bleeds and denies headaches, vision changes, gum bleeds, bloody/black stools, mouth sores, tingling/numbness in hands/feet, leg sores, abdominal pain, leg swelling, constipation and any other symptoms.   MEDICAL HISTORY:  Past Medical History:  Diagnosis Date  . BPH (benign prostatic hyperplasia)   . Colon polyp   . DDD (degenerative disc disease), cervical   . High cholesterol   . Migraine   . Polycythemia vera ([place])     SURGICAL HISTORY: Past Surgical History:  Procedure Laterality Date  . EYE SURGERY      SOCIAL HISTORY: Social History   Socioeconomic History  . Marital status: Married    Spouse name: Not on file  . Number of children: Not on file  . Years of education: Not on file  . Highest education level: Not on file  Occupational History  . Not on file  Tobacco Use  . Smoking status: Never Smoker  . Smokeless tobacco: Never Used  . Tobacco comment: smoked x 1 year at age 89  Substance and Sexual Activity  . Alcohol use: Yes    Comment: occ  . Drug use: Never  . Sexual activity: Not on file  Other Topics Concern  . Not on file  Social History Narrative  . Not on file   Social Determinants of Health   Financial Resource Strain:   . Difficulty of Paying Living Expenses: Not on file  Food Insecurity:   . Worried About Charity fundraiser in the Last Year: Not on file  . Ran Out of Food in the Last Year: Not on file  Transportation Needs:   . Lack of Transportation (Medical): Not on file  . Lack of Transportation (Non-Medical): Not on file  Physical Activity:   . Days of Exercise per Week: Not on file  . Minutes of Exercise per Session: Not on file  Stress:   . Feeling of  Stress : Not on file  Social Connections:   . Frequency of Communication with Friends and Family: Not on file  . Frequency of Social Gatherings with Friends and Family: Not on file  . Attends Religious Services: Not on file  . Active Member of Clubs or Organizations: Not on file  . Attends Archivist Meetings: Not on file  . Marital Status: Not on file  Intimate Partner Violence:   . Fear of Current or Ex-Partner: Not on file  . Emotionally Abused: Not on file  . Physically Abused: Not on file  . Sexually Abused: Not on file    FAMILY HISTORY: No family history on file.  ALLERGIES:  has No Known Allergies.  MEDICATIONS:  Current Outpatient Medications  Medication Sig Dispense Refill  . amLODipine (NORVASC) 10 MG tablet Take 1 tablet (10 mg total) by mouth daily. 30 tablet 0  . apixaban (ELIQUIS) 5 MG TABS tablet Take 1 tablet (5 mg total) by mouth 2 (two) times daily. 60 tablet 2  . hydrALAZINE (APRESOLINE) 25 MG tablet Take 1 tablet (25 mg total) by mouth every 12 (twelve) hours. 90 tablet 0  . hydroxyurea (HYDREA) 500 MG capsule 2 tabs (1000 mg) po once daily on Monday/wednesday and Friday and 1 tab (500mg ) po once daily on Tuesday/thursday/saturday/sunday.May take with food to minimize GI side effects. 60 capsule 3  . Multiple Vitamin (MULTIVITAMIN WITH MINERALS) TABS tablet Take 1 tablet by mouth daily.    . nortriptyline (PAMELOR) 10 MG capsule Take 1 capsule (10 mg total) by mouth at bedtime. 30 capsule 1  . SUMAtriptan (IMITREX) 100 MG tablet Take 100 mg by mouth as needed for migraine or headache.   1   No current facility-administered medications for this visit.    REVIEW OF SYSTEMS:   A 10+ POINT REVIEW OF SYSTEMS WAS OBTAINED including neurology, dermatology, psychiatry, cardiac, respiratory, lymph, extremities, GI, GU, Musculoskeletal, constitutional, breasts, reproductive, HEENT.  All pertinent positives are noted in the HPI.  All others are negative.    PHYSICAL EXAMINATION: ECOG PERFORMANCE STATUS: 2 - Symptomatic, <50% confined to bed  . Vitals:   12/28/19 0925  BP: 120/74  Pulse: 86  Resp: 18  Temp: 98 F (36.7 C)  SpO2: 100%   Filed Weights   12/28/19 0925  Weight: 154 lb 9.6 oz (70.1 kg)   .Body mass index is 21.56 kg/m.   GENERAL:alert, in no acute distress and comfortable SKIN: no acute rashes, no significant lesions EYES: conjunctiva are pink and non-injected, sclera anicteric OROPHARYNX: MMM, no exudates, no oropharyngeal erythema or ulceration NECK: supple, no JVD LYMPH:  no palpable lymphadenopathy in the cervical, axillary or inguinal regions LUNGS: clear to auscultation b/l with normal respiratory effort HEART: regular rate & rhythm ABDOMEN:  normoactive bowel sounds , non tender, not  distended. No palpable hepatosplenomegaly.  Extremity: no pedal edema PSYCH: alert & oriented x 3 with fluent speech NEURO: no focal motor/sensory deficits  LABORATORY DATA:  I have reviewed the data as listed  . CBC Latest Ref Rng & Units 12/28/2019 11/25/2019 11/03/2019  WBC 4.0 - 10.5 K/uL 2.0(L) 4.3 4.0  Hemoglobin 13.0 - 17.0 g/dL 10.9(L) 13.2 13.8  Hematocrit 39.0 - 52.0 % 34.8(L) 43.8 47.4  Platelets 150 - 400 K/uL 15(L) 162 556(H)    . CMP Latest Ref Rng & Units 12/28/2019 11/25/2019 11/03/2019  Glucose 70 - 99 mg/dL 71 81 118(H)  BUN 8 - 23 mg/dL 27(H) 23 23  Creatinine 0.61 - 1.24 mg/dL 1.32(H) 1.34(H) 1.43(H)  Sodium 135 - 145 mmol/L 141 141 138  Potassium 3.5 - 5.1 mmol/L 4.3 4.5 3.7  Chloride 98 - 111 mmol/L 106 103 102  CO2 22 - 32 mmol/L 28 30 27   Calcium 8.9 - 10.3 mg/dL 9.1 9.1 9.2  Total Protein 6.5 - 8.1 g/dL 6.8 7.0 7.0  Total Bilirubin 0.3 - 1.2 mg/dL 0.7 0.6 0.8  Alkaline Phos 38 - 126 U/L 110 102 127(H)  AST 15 - 41 U/L 26 26 32  ALT 0 - 44 U/L 19 15 22    10/24/2019 JAK2 mutation testing:    RADIOGRAPHIC STUDIES: I have personally reviewed the radiological images as listed and agreed with  the findings in the report. No results found.  ASSESSMENT & PLAN:   #1Severe thrombocytosis with platelets of 1005k in diagnosis 10/24/2019 JAK2 GenotypR is "POSITIVE for the detection of the [id number] mutation" #2 leukocytosis/primary neutrophilia. #3 polycythemia with a hematocrit of 51.7. #4 Dural venous sinus thrombosis -likely triggered by his polycythemia and thrombocytosis. #5 left arm hematoma likely related to a bicipital tendon injury. 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 10/24/2019 CALR Mutation analysis is "Negative"  10/24/2019 BCR ABL is "Negative"   PLAN: -Discussed pt labwork today, 12/28/19;  all values are WNL except for WBC at 2.0K, RBC at 3.96, Hgb at 10.9, HCT at 34.8, PLT at 15K, Neutro Abs at 0.7K, BUN at 27, Creatinine at 1.32, GFR Est Non Af Am at 54. -Discussed 12/28/2019 Reticulocytes is as follows: Retic Ct Pct at <0.4, RBC at 3.89, Retic Count Abs at 6.6, Immature Retic Fract at 4.6. -No indication for a therapeutic phlebotomy at this time - Goal HCT <45%, at 34.8 on 12/28/2019 -Advised pt of risk of increased bleeding due to PLT<20K -Advised pt to hold Eliquis at this time, will restart when PLT> 50K -Advised pt to hold Hydroxyurea at this time, will restart when PLT> 100K -Advised pt to avoid any OTC pain medications -Advised pt of 1-2% per year risk of transformation to an acute leukemia -Recommend pt f/u for second dose of COVID19 vaccine as scheduled -Advised pt to contact for any instances of significant bleeding -Will get labs twice per week to watch for PLT increase -Will see back in 2 weeks with labs    FOLLOW UP: Labs on Thursdays and mondays x 4 visits RTC with Dr Irene Limbo with labs in 2  weeks   The total time spent in the appt was 30 minutes and more than 50% was on counseling and direct patient cares.  All of the patient's questions were answered with apparent satisfaction. The  patient knows to call the clinic with any problems, questions or concerns.    Sullivan Lone MD [place] AAHIVMS [facility] Hematology/Oncology Physician [facility]  (Office):       [phone] (Work cell):  [phone] (Fax):           [phone]  12/28/2019 10:10 AM  I, Yevette Edwards, am acting as a scribe for Dr. Sullivan Lone.   .I have reviewed the above documentation for accuracy and completeness, and I agree with the above. Brunetta Genera MD

## 2019-12-28 NOTE — Patient Instructions (Signed)
-  Hold hydroxyurea and ELiquis till your platelets improve to >50K -no OTC NSAIDS -monitor closely for bleeding.

## 2019-12-29 ENCOUNTER — Telehealth: Payer: Self-pay | Admitting: Hematology

## 2019-12-29 NOTE — Telephone Encounter (Signed)
Scheduled per 02/08 los, patient has been called and notified. 

## 2019-12-31 ENCOUNTER — Other Ambulatory Visit: Payer: Medicare PPO

## 2020-01-01 ENCOUNTER — Other Ambulatory Visit: Payer: Self-pay

## 2020-01-01 ENCOUNTER — Inpatient Hospital Stay: Payer: Medicare PPO

## 2020-01-01 DIAGNOSIS — D45 Polycythemia vera: Secondary | ICD-10-CM

## 2020-01-01 DIAGNOSIS — D696 Thrombocytopenia, unspecified: Secondary | ICD-10-CM

## 2020-01-01 LAB — CBC WITH DIFFERENTIAL/PLATELET
Abs Immature Granulocytes: 0 10*3/uL (ref 0.00–0.07)
Basophils Absolute: 0 10*3/uL (ref 0.0–0.1)
Basophils Relative: 2 %
Eosinophils Absolute: 0.2 10*3/uL (ref 0.0–0.5)
Eosinophils Relative: 8 %
HCT: 33.6 % — ABNORMAL LOW (ref 39.0–52.0)
Hemoglobin: 10.6 g/dL — ABNORMAL LOW (ref 13.0–17.0)
Immature Granulocytes: 0 %
Lymphocytes Relative: 56 %
Lymphs Abs: 1.1 10*3/uL (ref 0.7–4.0)
MCH: 27.6 pg (ref 26.0–34.0)
MCHC: 31.5 g/dL (ref 30.0–36.0)
MCV: 87.5 fL (ref 80.0–100.0)
Monocytes Absolute: 0.2 10*3/uL (ref 0.1–1.0)
Monocytes Relative: 9 %
Neutro Abs: 0.5 10*3/uL — ABNORMAL LOW (ref 1.7–7.7)
Neutrophils Relative %: 25 %
Platelets: 20 10*3/uL — ABNORMAL LOW (ref 150–400)
RBC: 3.84 MIL/uL — ABNORMAL LOW (ref 4.22–5.81)
WBC: 1.9 10*3/uL — ABNORMAL LOW (ref 4.0–10.5)
nRBC: 0 % (ref 0.0–0.2)

## 2020-01-01 LAB — RETICULOCYTES
Immature Retic Fract: 3.8 % (ref 2.3–15.9)
RBC.: 3.75 MIL/uL — ABNORMAL LOW (ref 4.22–5.81)
Retic Count, Absolute: 7.1 10*3/uL — ABNORMAL LOW (ref 19.0–186.0)
Retic Ct Pct: <0.4 % — ABNORMAL LOW (ref 0.4–3.1)

## 2020-01-01 LAB — IMMATURE PLATELET FRACTION: Immature Platelet Fraction: 3.7 % (ref 1.2–8.6)

## 2020-01-02 ENCOUNTER — Other Ambulatory Visit: Payer: Self-pay | Admitting: Hematology

## 2020-01-03 LAB — FOLATE RBC
Folate, Hemolysate: 401 ng/mL
Folate, RBC: 1183 ng/mL (ref >498)
Hematocrit: 33.9 % — ABNORMAL LOW (ref 37.5–51.0)

## 2020-01-04 ENCOUNTER — Other Ambulatory Visit: Payer: Medicare PPO

## 2020-01-05 ENCOUNTER — Inpatient Hospital Stay: Payer: Medicare PPO

## 2020-01-05 ENCOUNTER — Other Ambulatory Visit: Payer: Self-pay

## 2020-01-05 ENCOUNTER — Telehealth: Payer: Self-pay | Admitting: *Deleted

## 2020-01-05 DIAGNOSIS — D45 Polycythemia vera: Secondary | ICD-10-CM

## 2020-01-05 DIAGNOSIS — D696 Thrombocytopenia, unspecified: Secondary | ICD-10-CM

## 2020-01-05 LAB — CBC WITH DIFFERENTIAL/PLATELET
Abs Immature Granulocytes: 0 10*3/uL (ref 0.00–0.07)
Basophils Absolute: 0 10*3/uL (ref 0.0–0.1)
Basophils Relative: 1 %
Eosinophils Absolute: 0.1 10*3/uL (ref 0.0–0.5)
Eosinophils Relative: 6 %
HCT: 30.6 % — ABNORMAL LOW (ref 39.0–52.0)
Hemoglobin: 9.7 g/dL — ABNORMAL LOW (ref 13.0–17.0)
Immature Granulocytes: 0 %
Lymphocytes Relative: 64 %
Lymphs Abs: 1 10*3/uL (ref 0.7–4.0)
MCH: 28.4 pg (ref 26.0–34.0)
MCHC: 31.7 g/dL (ref 30.0–36.0)
MCV: 89.5 fL (ref 80.0–100.0)
Monocytes Absolute: 0.2 10*3/uL (ref 0.1–1.0)
Monocytes Relative: 11 %
Neutro Abs: 0.3 10*3/uL — CL (ref 1.7–7.7)
Neutrophils Relative %: 18 %
Platelets: 24 10*3/uL — ABNORMAL LOW (ref 150–400)
RBC: 3.42 MIL/uL — ABNORMAL LOW (ref 4.22–5.81)
WBC: 1.6 10*3/uL — ABNORMAL LOW (ref 4.0–10.5)
nRBC: 0 % (ref 0.0–0.2)

## 2020-01-05 LAB — IMMATURE PLATELET FRACTION: Immature Platelet Fraction: 2.8 % (ref 1.2–8.6)

## 2020-01-05 LAB — RETICULOCYTES
Immature Retic Fract: 5.2 % (ref 2.3–15.9)
RBC.: 3.37 MIL/uL — ABNORMAL LOW (ref 4.22–5.81)
Retic Count, Absolute: 6.1 10*3/uL — ABNORMAL LOW (ref 19.0–186.0)
Retic Ct Pct: <0.4 % — ABNORMAL LOW (ref 0.4–3.1)

## 2020-01-05 NOTE — Telephone Encounter (Signed)
1213-Jen/lab called. ANC 0.3. 1250- Dr. Irene Limbo informed. MD Response: Advise patient to remain off Hydroxyurea and Eliquis. Confirm he is continuing to take FolicAcid. Continue infection preventionmeasures/neutropenic precautions. Notify office for increased temp or any bleeding. See emergent care is fever is over 100.4 or if bleeding is substantial or does not stop. Contacted patient - no answer - lvm that MD office calling with lab results and will contact patient 2/17.

## 2020-01-06 ENCOUNTER — Other Ambulatory Visit: Payer: Self-pay | Admitting: Hematology

## 2020-01-06 DIAGNOSIS — D696 Thrombocytopenia, unspecified: Secondary | ICD-10-CM

## 2020-01-06 DIAGNOSIS — D45 Polycythemia vera: Secondary | ICD-10-CM

## 2020-01-06 MED ORDER — FOLIC ACID 1 MG PO TABS: 1.0000 mg | ORAL_TABLET | Freq: Every day | ORAL | 1 refills | Status: DC

## 2020-01-06 NOTE — Telephone Encounter (Signed)
Contacted patient with advice as per Dr. Irene Limbo. Patient's prescription for Folic Acid has run out, refill sent to pharmacy.

## 2020-01-07 ENCOUNTER — Other Ambulatory Visit: Payer: Medicare PPO

## 2020-01-07 ENCOUNTER — Ambulatory Visit: Payer: Medicare PPO

## 2020-01-08 ENCOUNTER — Telehealth: Payer: Self-pay | Admitting: *Deleted

## 2020-01-08 ENCOUNTER — Inpatient Hospital Stay: Payer: Medicare PPO

## 2020-01-08 ENCOUNTER — Other Ambulatory Visit: Payer: Self-pay | Admitting: Hematology

## 2020-01-08 ENCOUNTER — Other Ambulatory Visit: Payer: Self-pay

## 2020-01-08 DIAGNOSIS — D61818 Other pancytopenia: Secondary | ICD-10-CM

## 2020-01-08 DIAGNOSIS — D696 Thrombocytopenia, unspecified: Secondary | ICD-10-CM

## 2020-01-08 DIAGNOSIS — D45 Polycythemia vera: Secondary | ICD-10-CM

## 2020-01-08 DIAGNOSIS — D471 Chronic myeloproliferative disease: Secondary | ICD-10-CM

## 2020-01-08 LAB — CBC WITH DIFFERENTIAL/PLATELET
Abs Immature Granulocytes: 0 10*3/uL (ref 0.00–0.07)
Basophils Absolute: 0 10*3/uL (ref 0.0–0.1)
Basophils Relative: 1 %
Eosinophils Absolute: 0.1 10*3/uL (ref 0.0–0.5)
Eosinophils Relative: 7 %
HCT: 31.4 % — ABNORMAL LOW (ref 39.0–52.0)
Hemoglobin: 10 g/dL — ABNORMAL LOW (ref 13.0–17.0)
Immature Granulocytes: 0 %
Lymphocytes Relative: 68 %
Lymphs Abs: 1 10*3/uL (ref 0.7–4.0)
MCH: 28.2 pg (ref 26.0–34.0)
MCHC: 31.8 g/dL (ref 30.0–36.0)
MCV: 88.7 fL (ref 80.0–100.0)
Monocytes Absolute: 0.2 10*3/uL (ref 0.1–1.0)
Monocytes Relative: 10 %
Neutro Abs: 0.2 10*3/uL — CL (ref 1.7–7.7)
Neutrophils Relative %: 14 %
Platelets: 38 10*3/uL — ABNORMAL LOW (ref 150–400)
RBC: 3.54 MIL/uL — ABNORMAL LOW (ref 4.22–5.81)
WBC: 1.5 10*3/uL — ABNORMAL LOW (ref 4.0–10.5)
nRBC: 0 % (ref 0.0–0.2)

## 2020-01-08 LAB — RETICULOCYTES
Immature Retic Fract: 4.9 % (ref 2.3–15.9)
RBC.: 3.57 MIL/uL — ABNORMAL LOW (ref 4.22–5.81)
Retic Count, Absolute: 8.9 10*3/uL — ABNORMAL LOW (ref 19.0–186.0)
Retic Ct Pct: <0.4 % — ABNORMAL LOW (ref 0.4–3.1)

## 2020-01-08 LAB — IMMATURE PLATELET FRACTION: Immature Platelet Fraction: 3.3 % (ref 1.2–8.6)

## 2020-01-08 NOTE — Telephone Encounter (Signed)
Contacted patient at Dr. Grier Mitts request. ANC today 0.2. Dr. Irene Limbo ordered a stat bone marrow biopsy. Patient informed to continue infection prevention/neutropenic precautions and to remain off Hydroxyurea and eliquis. Patient verbalized understanding of information and instructions.

## 2020-01-08 NOTE — Telephone Encounter (Signed)
1204 - Pam/[facility] lab called: ANC 0.2. 1240 Dr. Irene Limbo informed

## 2020-01-11 ENCOUNTER — Other Ambulatory Visit: Payer: Medicare PPO

## 2020-01-12 ENCOUNTER — Inpatient Hospital Stay: Payer: Medicare PPO

## 2020-01-12 ENCOUNTER — Other Ambulatory Visit: Payer: Self-pay

## 2020-01-12 DIAGNOSIS — D45 Polycythemia vera: Secondary | ICD-10-CM | POA: Diagnosis not present

## 2020-01-12 DIAGNOSIS — D696 Thrombocytopenia, unspecified: Secondary | ICD-10-CM

## 2020-01-12 LAB — CBC WITH DIFFERENTIAL/PLATELET
Abs Immature Granulocytes: 0 10*3/uL (ref 0.00–0.07)
Basophils Absolute: 0 10*3/uL (ref 0.0–0.1)
Basophils Relative: 1 %
Eosinophils Absolute: 0.1 10*3/uL (ref 0.0–0.5)
Eosinophils Relative: 3 %
HCT: 28.8 % — ABNORMAL LOW (ref 39.0–52.0)
Hemoglobin: 9.1 g/dL — ABNORMAL LOW (ref 13.0–17.0)
Immature Granulocytes: 0 %
Lymphocytes Relative: 56 %
Lymphs Abs: 0.9 10*3/uL (ref 0.7–4.0)
MCH: 28.8 pg (ref 26.0–34.0)
MCHC: 31.6 g/dL (ref 30.0–36.0)
MCV: 91.1 fL (ref 80.0–100.0)
Monocytes Absolute: 0.2 10*3/uL (ref 0.1–1.0)
Monocytes Relative: 15 %
Neutro Abs: 0.4 10*3/uL — CL (ref 1.7–7.7)
Neutrophils Relative %: 25 %
Platelets: 72 10*3/uL — ABNORMAL LOW (ref 150–400)
RBC: 3.16 MIL/uL — ABNORMAL LOW (ref 4.22–5.81)
WBC: 1.6 10*3/uL — ABNORMAL LOW (ref 4.0–10.5)
nRBC: 0 % (ref 0.0–0.2)

## 2020-01-12 LAB — RETICULOCYTES
Immature Retic Fract: 20.6 % — ABNORMAL HIGH (ref 2.3–15.9)
RBC.: 3.14 MIL/uL — ABNORMAL LOW (ref 4.22–5.81)
Retic Count, Absolute: 7.2 10*3/uL — ABNORMAL LOW (ref 19.0–186.0)
Retic Ct Pct: <0.4 % — ABNORMAL LOW (ref 0.4–3.1)

## 2020-01-12 LAB — IMMATURE PLATELET FRACTION: Immature Platelet Fraction: 3.6 % (ref 1.2–8.6)

## 2020-01-13 ENCOUNTER — Telehealth: Payer: Self-pay | Admitting: *Deleted

## 2020-01-13 ENCOUNTER — Telehealth: Payer: Self-pay | Admitting: Hematology

## 2020-01-13 NOTE — Telephone Encounter (Signed)
Scheduled appt per 2/24 sch message - - pt aware of appt date and time   

## 2020-01-13 NOTE — Telephone Encounter (Signed)
Per MD request - appts for lab/MD on 2/26 cancelled. Send schedule message for PHONE appt on 3/8 at 3:20pm to discuss BM bx results (scheduled 3/3). Contacted patient. Patient in agreement. 2/26 appts cxld.Schedule message sent for 3/8 Phone appt at 3:20PM

## 2020-01-15 ENCOUNTER — Ambulatory Visit: Payer: Medicare PPO | Admitting: Hematology

## 2020-01-15 ENCOUNTER — Other Ambulatory Visit: Payer: Medicare PPO

## 2020-01-18 ENCOUNTER — Other Ambulatory Visit: Payer: Self-pay | Admitting: Radiology

## 2020-01-19 ENCOUNTER — Encounter (HOSPITAL_COMMUNITY): Payer: Self-pay

## 2020-01-19 ENCOUNTER — Other Ambulatory Visit: Payer: Self-pay

## 2020-01-19 ENCOUNTER — Ambulatory Visit (HOSPITAL_COMMUNITY)
Admission: RE | Admit: 2020-01-19 | Discharge: 2020-01-19 | Disposition: A | Discharge status: Home / Self Care | Payer: Medicare PPO | Source: Ambulatory Visit | Attending: Hematology | Admitting: Hematology

## 2020-01-19 DIAGNOSIS — E7800 Pure hypercholesterolemia, unspecified: Secondary | ICD-10-CM | POA: Insufficient documentation

## 2020-01-19 DIAGNOSIS — N400 Enlarged prostate without lower urinary tract symptoms: Secondary | ICD-10-CM | POA: Diagnosis not present

## 2020-01-19 DIAGNOSIS — Z79899 Other long term (current) drug therapy: Secondary | ICD-10-CM | POA: Insufficient documentation

## 2020-01-19 DIAGNOSIS — D471 Chronic myeloproliferative disease: Secondary | ICD-10-CM

## 2020-01-19 DIAGNOSIS — Z7901 Long term (current) use of anticoagulants: Secondary | ICD-10-CM | POA: Diagnosis not present

## 2020-01-19 DIAGNOSIS — G43909 Migraine, unspecified, not intractable, without status migrainosus: Secondary | ICD-10-CM | POA: Insufficient documentation

## 2020-01-19 DIAGNOSIS — D61818 Other pancytopenia: Secondary | ICD-10-CM | POA: Diagnosis present

## 2020-01-19 LAB — CBC WITH DIFFERENTIAL/PLATELET
Abs Immature Granulocytes: 0.03 10*3/uL (ref 0.00–0.07)
Basophils Absolute: 0 10*3/uL (ref 0.0–0.1)
Basophils Relative: 0 %
Eosinophils Absolute: 0 10*3/uL (ref 0.0–0.5)
Eosinophils Relative: 0 %
HCT: 27.7 % — ABNORMAL LOW (ref 39.0–52.0)
Hemoglobin: 9 g/dL — ABNORMAL LOW (ref 13.0–17.0)
Immature Granulocytes: 1 %
Lymphocytes Relative: 37 %
Lymphs Abs: 1.1 10*3/uL (ref 0.7–4.0)
MCH: 30.1 pg (ref 26.0–34.0)
MCHC: 32.5 g/dL (ref 30.0–36.0)
MCV: 92.6 fL (ref 80.0–100.0)
Monocytes Absolute: 0.7 10*3/uL (ref 0.1–1.0)
Monocytes Relative: 22 %
Neutro Abs: 1.2 10*3/uL — ABNORMAL LOW (ref 1.7–7.7)
Neutrophils Relative %: 40 %
Platelets: 122 10*3/uL — ABNORMAL LOW (ref 150–400)
RBC: 2.99 MIL/uL — ABNORMAL LOW (ref 4.22–5.81)
WBC: 3.1 10*3/uL — ABNORMAL LOW (ref 4.0–10.5)
nRBC: 0 % (ref 0.0–0.2)

## 2020-01-19 LAB — PROTIME-INR
INR: 1 (ref 0.8–1.2)
Prothrombin Time: 13.3 seconds (ref 11.4–15.2)

## 2020-01-19 MED ORDER — LIDOCAINE HCL (PF) 1 % IJ SOLN
INTRAMUSCULAR | Status: AC | PRN
  Administered 2020-01-19: 10 mL

## 2020-01-19 MED ORDER — MIDAZOLAM HCL 2 MG/2ML IJ SOLN
INTRAMUSCULAR | Status: AC
  Filled 2020-01-19: qty 4

## 2020-01-19 MED ORDER — SODIUM CHLORIDE 0.9 % IV SOLN: INTRAVENOUS | Status: DC

## 2020-01-19 MED ORDER — FENTANYL CITRATE (PF) 100 MCG/2ML IJ SOLN
INTRAMUSCULAR | Status: AC | PRN
  Administered 2020-01-19: 50 ug via INTRAVENOUS

## 2020-01-19 MED ORDER — FENTANYL CITRATE (PF) 100 MCG/2ML IJ SOLN
INTRAMUSCULAR | Status: AC
  Filled 2020-01-19: qty 2

## 2020-01-19 MED ORDER — MIDAZOLAM HCL 2 MG/2ML IJ SOLN
INTRAMUSCULAR | Status: AC | PRN
  Administered 2020-01-19: 1 mg via INTRAVENOUS

## 2020-01-19 NOTE — Procedures (Signed)
Interventional Radiology Procedure:   Indications: Pancytopenia  Procedure: CT guided bone marrow biopsy  Findings: 2 aspirates and 1 core from right ilium  Complications: None     EBL: Minimal, less than 10 ml  Plan: Discharge to home in one hour.   Yoskar Murrillo R. Madox Corkins, MD  Pager: [phone]    

## 2020-01-19 NOTE — H&P (Signed)
Chief Complaint: Patient was seen in consultation today for a bone marrow biopsy.  Referring Physician(s): Brunetta Genera  Supervising Physician: Markus Daft  Patient Status: [facility] - Out-pt  History of Present Illness: Dillon Hill is a 74 y.o. male with a past medical history significant for DDD, BPH, migraines and pancytopenia followed by Dr. Irene Limbo who presents today for a bone marrow biopsy. Dillon Hill presented to the ED on 10/23/19 with a left arm hematoma and migraines, initial workup noted a platelet count of >1000 and CT venogram of the head showed findings concerning for dural venous sinus thrombosis. Dillon Hill was admitted for further evaluation and management - hematology and neurology were consulted, there was concern for a myeloproliferative disorder such as polycythemia vera. Dillon Hill underwent therapeutic phlebotomy, started on hydroxyurea and was ultimately discharged on 12/8 with planned outpatient follow up. Dillon Hill was last seen by Dr. Irene Limbo on 12/28/19 and decision was made to proceed with a bone marrow biopsy to further guide management.  Dillon Hill denies any complaints today, has not had any headaches recently and has been in his usual state of health. Dillon Hill denies any abnormal bleeding or bruising. Dillon Hill states understanding of the requested procedure and is agreeable to proceed.  Past Medical History:  Diagnosis Date  . BPH (benign prostatic hyperplasia)   . Colon polyp   . DDD (degenerative disc disease), cervical   . High cholesterol   . Migraine   . Polycythemia vera ([place])     Past Surgical History:  Procedure Laterality Date  . EYE SURGERY      Allergies: Patient has no known allergies.  Medications: Prior to Admission medications   Medication Sig Start Date End Date Taking? Authorizing Provider  amLODipine (NORVASC) 10 MG tablet Take 1 tablet (10 mg total) by mouth daily. 10/28/19  Yes Hosie Poisson, MD  folic acid (FOLVITE) 1 MG tablet TAKE 1 TABLET(1 MG) BY MOUTH DAILY  01/06/20  Yes Brunetta Genera, MD  hydrALAZINE (APRESOLINE) 25 MG tablet Take 1 tablet (25 mg total) by mouth every 12 (twelve) hours. 11/03/19  Yes Brunetta Genera, MD  Multiple Vitamin (MULTIVITAMIN WITH MINERALS) TABS tablet Take 1 tablet by mouth daily. 10/28/19  Yes Hosie Poisson, MD  nortriptyline (PAMELOR) 10 MG capsule Take 1 capsule (10 mg total) by mouth at bedtime. 11/03/19  Yes Brunetta Genera, MD  apixaban (ELIQUIS) 5 MG TABS tablet Take 1 tablet (5 mg total) by mouth 2 (two) times daily. 11/03/19   Brunetta Genera, MD  hydroxyurea (HYDREA) 500 MG capsule 2 tabs (1000 mg) po once daily on Monday/wednesday and Friday and 1 tab (588m) po once daily on Tuesday/thursday/saturday/sunday.May take with food to minimize GI side effects. 11/25/19   KBrunetta Genera MD  SUMAtriptan (IMITREX) 100 MG tablet Take 100 mg by mouth as needed for migraine or headache.  10/21/15   [provider]     History reviewed. No pertinent family history.  Social History   Socioeconomic History  . Marital status: Married    Spouse name: Not on file  . Number of children: Not on file  . Years of education: Not on file  . Highest education level: Not on file  Occupational History  . Not on file  Tobacco Use  . Smoking status: Never Smoker  . Smokeless tobacco: Never Used  . Tobacco comment: smoked x 1 year at age 74 Substance and Sexual Activity  . Alcohol use: Yes    Comment: occ  .  Drug use: Never  . Sexual activity: Not on file  Other Topics Concern  . Not on file  Social History Narrative  . Not on file   Social Determinants of Health   Financial Resource Strain:   . Difficulty of Paying Living Expenses: Not on file  Food Insecurity:   . Worried About Charity fundraiser in the Last Year: Not on file  . Ran Out of Food in the Last Year: Not on file  Transportation Needs:   . Lack of Transportation (Medical): Not on file  . Lack of Transportation  (Non-Medical): Not on file  Physical Activity:   . Days of Exercise per Week: Not on file  . Minutes of Exercise per Session: Not on file  Stress:   . Feeling of Stress : Not on file  Social Connections:   . Frequency of Communication with Friends and Family: Not on file  . Frequency of Social Gatherings with Friends and Family: Not on file  . Attends Religious Services: Not on file  . Active Member of Clubs or Organizations: Not on file  . Attends Archivist Meetings: Not on file  . Marital Status: Not on file     Review of Systems: A 12 point ROS discussed and pertinent positives are indicated in the HPI above.  All other systems are negative.  Review of Systems  Constitutional: Negative for appetite change, chills and fever.  HENT: Negative for nosebleeds.   Respiratory: Negative for cough and shortness of breath.   Gastrointestinal: Negative for abdominal pain, blood in stool, diarrhea, nausea and vomiting.  Genitourinary: Negative for hematuria.  Musculoskeletal: Negative for back pain.  Skin: Negative for rash and wound.  Neurological: Negative for dizziness and headaches.  Hematological: Does not bruise/bleed easily.    Vital Signs: Ht '5\' 11"'  (1.803 m)   Wt 148 lb (67.1 kg)   BMI 20.64 kg/m   Physical Exam Vitals reviewed.  Constitutional:      General: Dillon Hill is not in acute distress. HENT:     Head: Normocephalic.     Mouth/Throat:     Mouth: Mucous membranes are moist.     Pharynx: Oropharynx is clear. No oropharyngeal exudate or posterior oropharyngeal erythema.  Cardiovascular:     Rate and Rhythm: Normal rate and regular rhythm.  Pulmonary:     Effort: Pulmonary effort is normal.     Breath sounds: Normal breath sounds.  Abdominal:     General: There is no distension.     Palpations: Abdomen is soft.     Tenderness: There is no abdominal tenderness.  Skin:    General: Skin is warm and dry.  Neurological:     Mental Status: Dillon Hill is alert and  oriented to person, place, and time.  Psychiatric:        Mood and Affect: Mood normal.        Behavior: Behavior normal.        Thought Content: Thought content normal.        Judgment: Judgment normal.      MD Evaluation Airway: WNL Heart: WNL Abdomen: WNL Chest/ Lungs: WNL ASA  Classification: 2 Mallampati/Airway Score: One   Imaging: No results found.  Labs:  CBC: Recent Labs    01/01/20 1102 01/05/20 1136 01/08/20 1122 01/12/20 1148  WBC 1.9* 1.6* 1.5* 1.6*  HGB 10.6* 9.7* 10.0* 9.1*  HCT 33.6*  33.9* 30.6* 31.4* 28.8*  PLT 20* 24* 38* 72*    COAGS: Recent  Labs    10/23/19 1411 10/24/19 1941 10/25/19 0317  INR 1.1 1.2 1.2  APTT  --  43*  --     BMP: Recent Labs    10/26/19 0357 11/03/19 1024 11/25/19 1247 12/28/19 0908  NA 137 138 141 141  K 3.9 3.7 4.5 4.3  CL 103 102 103 106  CO2 '22 27 30 28  ' GLUCOSE 110* 118* 81 71  BUN '21 23 23 ' 27*  CALCIUM 9.0 9.2 9.1 9.1  CREATININE 1.40* 1.43* 1.34* 1.32*  GFRNONAA 50* 49* 53* 54*  GFRAA 58* 56* >60 >60    LIVER FUNCTION TESTS: Recent Labs    10/26/19 0357 11/03/19 1024 11/25/19 1247 12/28/19 0908  BILITOT 1.1 0.8 0.6 0.7  AST 26 32 26 26  ALT '13 22 15 19  ' ALKPHOS 127* 127* 102 110  PROT 6.4* 7.0 7.0 6.8  ALBUMIN 3.2* 3.7 4.0 3.8    TUMOR MARKERS: No results for input(s): AFPTM, CEA, CA199, CHROMGRNA in the last 8760 hours.  Assessment and Plan:  74 y/o M with history of pancytopenia concerning for a myeloproliferative disorder, possibly polycythemia vera, followed by Dr. Irene Limbo who presents today for a bone marrow biopsy to further direct management.  Patient has been NPO since 8 pm last night, Dillon Hill does not take any blood thinning medications. Afebrile, WBC 3.1, hgb 9.0, plt unable to be measured, INR 1.0.  Risks and benefits of bone marrow biopsy was discussed with the patient and/or patient's family including, but not limited to bleeding, infection, damage to adjacent structures or  low yield requiring additional tests.  All of the questions were answered and there is agreement to proceed.  Consent signed and in chart.   Thank you for this interesting consult.  I greatly enjoyed meeting Dillon Hill and look forward to participating in their care.  A copy of this report was sent to the requesting provider on this date.  Electronically Signed: Joaquim Nam, PA-C 01/19/2020, 8:09 AM   I spent a total of  30 Minutes  in face to face in clinical consultation, greater than 50% of which was counseling/coordinating care for bone marrow biopsy.

## 2020-01-19 NOTE — Discharge Instructions (Signed)
Please call Interventional Radiology [phone] with any questions about your biopsy.  You may shower and remove your dressing tomorrow.  Moderate Conscious Sedation, Adult, Care After These instructions provide you with information about caring for yourself after your procedure. Your health care provider may also give you more specific instructions. Your treatment has been planned according to current medical practices, but problems sometimes occur. Call your health care provider if you have any problems or questions after your procedure. What can I expect after the procedure? After your procedure, it is common:  To feel sleepy for several hours.  To feel clumsy and have poor balance for several hours.  To have poor judgment for several hours.  To vomit if you eat too soon. Follow these instructions at home: For at least 24 hours after the procedure:   Do not: ? Participate in activities where you could fall or become injured. ? Drive. ? Use heavy machinery. ? Drink alcohol. ? Take sleeping pills or medicines that cause drowsiness. ? Make important decisions or sign legal documents. ? Take care of children on your own.  Rest. Eating and drinking  Follow the diet recommended by your health care provider.  If you vomit: ? Drink water, juice, or soup when you can drink without vomiting. ? Make sure you have little or no nausea before eating solid foods. General instructions  Have a responsible adult stay with you until you are awake and alert.  Take over-the-counter and prescription medicines only as told by your health care provider.  If you smoke, do not smoke without supervision.  Keep all follow-up visits as told by your health care provider. This is important. Contact a health care provider if:  You keep feeling nauseous or you keep vomiting.  You feel light-headed.  You develop a rash.  You have a fever. Get help right away if:  You have trouble  breathing. This information is not intended to replace advice given to you by your health care provider. Make sure you discuss any questions you have with your health care provider. Document Revised: 10/18/2017 Document Reviewed: 02/25/2016 Elsevier Patient Education  2020 [place].   Bone Marrow Aspiration and Bone Marrow Biopsy, Adult, Care After This sheet gives you information about how to care for yourself after your procedure. Your health care provider may also give you more specific instructions. If you have problems or questions, contact your health care provider. What can I expect after the procedure? After the procedure, it is common to have:  Mild pain and tenderness.  Swelling.  Bruising. Follow these instructions at home: Puncture site care   Follow instructions from your health care provider about how to take care of the puncture site. Make sure you: ? Wash your hands with soap and water before and after you change your bandage (dressing). If soap and water are not available, use hand sanitizer. ? Change your dressing as told by your health care provider.  Check your puncture site every day for signs of infection. Check for: ? More redness, swelling, or pain. ? Fluid or blood. ? Warmth. ? Pus or a bad smell. Activity  Return to your normal activities as told by your health care provider. Ask your health care provider what activities are safe for you.  Do not lift anything that is heavier than 10 lb (4.5 kg), or the limit that you are told, until your health care provider says that it is safe.  Do not drive for 24 hours  if you were given a sedative during your procedure. General instructions   Take over-the-counter and prescription medicines only as told by your health care provider.  Do not take baths, swim, or use a hot tub until your health care provider approves. Ask your health care provider if you may take showers. You may only be allowed to take  sponge baths.  If directed, put ice on the affected area. To do this: ? Put ice in a plastic bag. ? Place a towel between your skin and the bag. ? Leave the ice on for 20 minutes, 2-3 times a day.  Keep all follow-up visits as told by your health care provider. This is important. Contact a health care provider if:  Your pain is not controlled with medicine.  You have a fever.  You have more redness, swelling, or pain around the puncture site.  You have fluid or blood coming from the puncture site.  Your puncture site feels warm to the touch.  You have pus or a bad smell coming from the puncture site. Summary  After the procedure, it is common to have mild pain, tenderness, swelling, and bruising.  Follow instructions from your health care provider about how to take care of the puncture site and what activities are safe for you.  Take over-the-counter and prescription medicines only as told by your health care provider.  Contact a health care provider if you have any signs of infection, such as fluid or blood coming from the puncture site. This information is not intended to replace advice given to you by your health care provider. Make sure you discuss any questions you have with your health care provider. Document Revised: 03/24/2019 Document Reviewed: 03/24/2019 Elsevier Patient Education  [place].

## 2020-01-21 LAB — SURGICAL PATHOLOGY

## 2020-01-25 ENCOUNTER — Inpatient Hospital Stay: Payer: Medicare PPO | Attending: Hematology | Admitting: Hematology

## 2020-01-25 DIAGNOSIS — D471 Chronic myeloproliferative disease: Secondary | ICD-10-CM | POA: Diagnosis not present

## 2020-01-25 DIAGNOSIS — D61818 Other pancytopenia: Secondary | ICD-10-CM

## 2020-01-25 NOTE — Progress Notes (Signed)
HEMATOLOGY/ONCOLOGY CLINIC NOTE  Date of Service: 01/25/2020  Patient Care Team: [name], MD as PCP - General (Family Medicine)  CHIEF COMPLAINTS/PURPOSE OF CONSULTATION:  Concern for a myeloproliferative neoplasm likely polycythemia vera or essential thrombocytosis  HISTORY OF PRESENTING ILLNESS:  Dillon Hill is a wonderful 74 y.o. male with medical history significant for migraine headaches benign prostatic hypertrophy and hyperlipidemia who presented to the [facility] in [facility] with large hematoma to left arm.  He was noted to have thrombocytopenia.  Other concerns will be noted.  He has been transferred for further work-up here ED Course: In the emergency room patient had work-up including including which was concerning for possible dural venous sinus thrombosis and MRI with and without contrast and MRV to evaluate was slated to be done at [facility] long. Patient had no significant symptoms of any kind.   INTERVAL HISTORY:   I connected with  [name] on 01/25/20 by telephone and verified that I am speaking with the correct person using two identifiers.   I discussed the limitations of evaluation and management by telemedicine. The patient expressed understanding and agreed to proceed.  Other persons participating in the visit and their role in the encounter:         -Yevette Edwards, Medical Scribe  Patient's location: Home Provider's location: [place] [facility] [place] is a wonderful 74 y.o. male is here for the evaluation and management of polycythemia vera. The patient's last visit with [name] was on 12/28/2019. The pt reports that he is doing well overall.  The pt reports that he is feeling well and is not experiencing significant fatigue. Pt has had both doses of the COVID19 vaccine and denies any residual symptoms. He notes that he discontinued taking his Folic acid a few months prior when his prescription ran out.   Of note since the patient's last  visit, pt has had BM Bx ([id number]) completed on 01/19/2020 with results revealing "BONE MARROW, ASPIRATE, CLOT, CORE:  - Normocellular marrow with megakaryocytic hyperplasia and atypia  - Documented JAK-2 (V617F) mutation.  PERIPHERAL BLOOD: - Pancytopenia."  Pt has also had BM Bx Flow Pathology ([id number]) completed on 01/19/2020 with results revealing "No monoclonal B-cell or phenotypically apparent T-cell, or distinct blast population identified."  Lab results (01/19/20) of CBC w/diff is as follows: all values are WNL except for WBC at 3.1K, RBC at 2.99, Hgb at 9.0, HCT at 27.7, PLT at 122K, Neutro Abs at 1.2K.  On review of systems, pt denies fatigue, headache and any other symptoms.   MEDICAL HISTORY:  Past Medical History:  Diagnosis Date  . BPH (benign prostatic hyperplasia)   . Colon polyp   . DDD (degenerative disc disease), cervical   . High cholesterol   . Migraine   . Polycythemia vera ([place])     SURGICAL HISTORY: Past Surgical History:  Procedure Laterality Date  . EYE SURGERY      SOCIAL HISTORY: Social History   Socioeconomic History  . Marital status: Married    Spouse name: Not on file  . Number of children: Not on file  . Years of education: Not on file  . Highest education level: Not on file  Occupational History  . Not on file  Tobacco Use  . Smoking status: Never Smoker  . Smokeless tobacco: Never Used  . Tobacco comment: smoked x 1 year at age 79  Substance and Sexual Activity  . Alcohol use: Yes    Comment:  occ  . Drug use: Never  . Sexual activity: Not on file  Other Topics Concern  . Not on file  Social History Narrative  . Not on file   Social Determinants of Health   Financial Resource Strain:   . Difficulty of Paying Living Expenses: Not on file  Food Insecurity:   . Worried About Charity fundraiser in the Last Year: Not on file  . Ran Out of Food in the Last Year: Not on file  Transportation Needs:   . Lack of  Transportation (Medical): Not on file  . Lack of Transportation (Non-Medical): Not on file  Physical Activity:   . Days of Exercise per Week: Not on file  . Minutes of Exercise per Session: Not on file  Stress:   . Feeling of Stress : Not on file  Social Connections:   . Frequency of Communication with Friends and Family: Not on file  . Frequency of Social Gatherings with Friends and Family: Not on file  . Attends Religious Services: Not on file  . Active Member of Clubs or Organizations: Not on file  . Attends Archivist Meetings: Not on file  . Marital Status: Not on file  Intimate Partner Violence:   . Fear of Current or Ex-Partner: Not on file  . Emotionally Abused: Not on file  . Physically Abused: Not on file  . Sexually Abused: Not on file    FAMILY HISTORY: No family history on file.  ALLERGIES:  has No Known Allergies.  MEDICATIONS:  Current Outpatient Medications  Medication Sig Dispense Refill  . amLODipine (NORVASC) 10 MG tablet Take 1 tablet (10 mg total) by mouth daily. 30 tablet 0  . apixaban (ELIQUIS) 5 MG TABS tablet Take 1 tablet (5 mg total) by mouth 2 (two) times daily. 60 tablet 2  . folic acid (FOLVITE) 1 MG tablet TAKE 1 TABLET(1 MG) BY MOUTH DAILY 90 tablet 0  . hydrALAZINE (APRESOLINE) 25 MG tablet Take 1 tablet (25 mg total) by mouth every 12 (twelve) hours. 90 tablet 0  . hydroxyurea (HYDREA) 500 MG capsule 2 tabs (1000 mg) po once daily on Monday/wednesday and Friday and 1 tab (586m) po once daily on Tuesday/thursday/saturday/sunday.May take with food to minimize GI side effects. 60 capsule 3  . Multiple Vitamin (MULTIVITAMIN WITH MINERALS) TABS tablet Take 1 tablet by mouth daily.    . nortriptyline (PAMELOR) 10 MG capsule Take 1 capsule (10 mg total) by mouth at bedtime. 30 capsule 1  . SUMAtriptan (IMITREX) 100 MG tablet Take 100 mg by mouth as needed for migraine or headache.   1   No current facility-administered medications for this  visit.    REVIEW OF SYSTEMS:   A 10+ POINT REVIEW OF SYSTEMS WAS OBTAINED including neurology, dermatology, psychiatry, cardiac, respiratory, lymph, extremities, GI, GU, Musculoskeletal, constitutional, breasts, reproductive, HEENT.  All pertinent positives are noted in the HPI.  All others are negative.   PHYSICAL EXAMINATION:  Telehealth visit  LABORATORY DATA:  I have reviewed the data as listed  . CBC Latest Ref Rng & Units 01/19/2020 01/12/2020 01/08/2020  WBC 4.0 - 10.5 K/uL 3.1(L) 1.6(L) 1.5(L)  Hemoglobin 13.0 - 17.0 g/dL 9.0(L) 9.1(L) 10.0(L)  Hematocrit 39.0 - 52.0 % 27.7(L) 28.8(L) 31.4(L)  Platelets 150 - 400 K/uL 122(L) 72(L) 38(L)    . CMP Latest Ref Rng & Units 12/28/2019 11/25/2019 11/03/2019  Glucose 70 - 99 mg/dL 71 81 118(H)  BUN 8 - 23 mg/dL 27(H)  23 23  Creatinine 0.61 - 1.24 mg/dL 1.32(H) 1.34(H) 1.43(H)  Sodium 135 - 145 mmol/L 141 141 138  Potassium 3.5 - 5.1 mmol/L 4.3 4.5 3.7  Chloride 98 - 111 mmol/L 106 103 102  CO2 22 - 32 mmol/L '28 30 27  ' Calcium 8.9 - 10.3 mg/dL 9.1 9.1 9.2  Total Protein 6.5 - 8.1 g/dL 6.8 7.0 7.0  Total Bilirubin 0.3 - 1.2 mg/dL 0.7 0.6 0.8  Alkaline Phos 38 - 126 U/L 110 102 127(H)  AST 15 - 41 U/L 26 26 32  ALT 0 - 44 U/L '19 15 22   ' 01/19/2020 [id number] Flow Pathology (BM Bx):   01/19/2020 [id number] BM Bx Report:    10/24/2019 JAK2 mutation testing:    RADIOGRAPHIC STUDIES: I have personally reviewed the radiological images as listed and agreed with the findings in the report. CT Biopsy  Result Date: 01/19/2020 INDICATION: 74 year old with pancytopenia. EXAM: CT GUIDED BONE MARROW ASPIRATES AND BIOPSY Physician: Stephan Minister. Anselm Pancoast, MD MEDICATIONS: None. ANESTHESIA/SEDATION: Fentanyl 100 mcg IV; Versed 2.0 mg IV Moderate Sedation Time:  12 minutes The patient was continuously monitored during the procedure by the interventional radiology nurse under my direct supervision. COMPLICATIONS: None immediate. PROCEDURE: The  procedure was explained to the patient. The risks and benefits of the procedure were discussed and the patient's questions were addressed. Informed consent was obtained from the patient. The patient was placed prone on CT table. Images of the pelvis were obtained. The right side of back was prepped and draped in sterile fashion. The skin and right posterior ilium were anesthetized with 1% lidocaine. 11 gauge bone needle was directed into the right ilium with CT guidance. Two aspirates and one core biopsy were obtained. Bandage placed over the puncture site. IMPRESSION: CT guided bone marrow aspiration and core biopsy. Electronically Signed   By: Markus Daft M.D.   On: 01/19/2020 11:06   CT BONE MARROW BIOPSY & ASPIRATION  Result Date: 01/19/2020 INDICATION: 74 year old with pancytopenia. EXAM: CT GUIDED BONE MARROW ASPIRATES AND BIOPSY Physician: Stephan Minister. Anselm Pancoast, MD MEDICATIONS: None. ANESTHESIA/SEDATION: Fentanyl 100 mcg IV; Versed 2.0 mg IV Moderate Sedation Time:  12 minutes The patient was continuously monitored during the procedure by the interventional radiology nurse under my direct supervision. COMPLICATIONS: None immediate. PROCEDURE: The procedure was explained to the patient. The risks and benefits of the procedure were discussed and the patient's questions were addressed. Informed consent was obtained from the patient. The patient was placed prone on CT table. Images of the pelvis were obtained. The right side of back was prepped and draped in sterile fashion. The skin and right posterior ilium were anesthetized with 1% lidocaine. 11 gauge bone needle was directed into the right ilium with CT guidance. Two aspirates and one core biopsy were obtained. Bandage placed over the puncture site. IMPRESSION: CT guided bone marrow aspiration and core biopsy. Electronically Signed   By: Markus Daft M.D.   On: 01/19/2020 11:06    ASSESSMENT & PLAN:   #1Severe thrombocytosis with platelets of 1005k in  diagnosis 10/24/2019 JAK2 GenotypR is "POSITIVE for the detection of the V617F mutation" #2 leukocytosis/primary neutrophilia. #3 polycythemia with a hematocrit of 51.7. #4 Dural venous sinus thrombosis -likely triggered by his polycythemia and thrombocytosis. #5 left arm hematoma likely related to a bicipital tendon injury. 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 10/24/2019 CALR Mutation analysis is "Negative"  10/24/2019 BCR ABL is "Negative"   PLAN: -Discussed pt labwork, 01/19/20; mild anemia, WBC and PLT are improving  -Discussed 01/19/2020 BM Bx ([id number]) which revealed "BONE MARROW, ASPIRATE, CLOT, CORE:  - Normocellular marrow with megakaryocytic hyperplasia and atypia  - Documented JAK-2 (V617F) mutation. PERIPHERAL BLOOD: - Pancytopenia." -Discussed 01/19/2020 BM Bx Flow Pathology ([id number]) which revealed "No monoclonal B-cell or phenotypically apparent T-cell, or distinct blast population identified." -No indication for a therapeutic phlebotomy at this time - Goal HCT <45%, at HCT at 27.7 on 01/19/2020 -No evidence of bone marrow scarring or an infiltrative process. Cytopenias appear to be caused by Hydroxyurea.  -Recommend pt restart Eliquis  -Continue to hold Hydroxyurea at this time. Will restart based on next labs.  -Recommend pt continue daily multivitamin to promote blood count improvement  -Advised pt to avoid any OTC pain medications -Will see back in 4 weeks via phone. Will get labs one day prior.   -Advised pt to contact for any instances of significant bleeding, or changes in symptomology   FOLLOW UP: Phone visit and labs in 4 weeks. Plz schedule labs 1 day prior to phone visit   All of the patient's  questions were answered with apparent satisfaction. The patient knows to call the clinic with any problems, questions or concerns.    Sullivan Lone MD [place] AAHIVMS [facility] Hematology/Oncology Physician [facility]  (Office):       [phone] (Work cell):  [phone] (Fax):           [phone]  01/25/2020 4:18 PM  I, Yevette Edwards, am acting as a scribe for Dr. Sullivan Lone.   .I have reviewed the above documentation for accuracy and completeness, and I agree with the above. Brunetta Genera MD

## 2020-01-26 ENCOUNTER — Encounter (HOSPITAL_COMMUNITY): Payer: Self-pay | Admitting: Hematology

## 2020-01-27 LAB — SURGICAL PATHOLOGY

## 2020-01-28 ENCOUNTER — Telehealth: Payer: Self-pay | Admitting: Hematology

## 2020-01-28 NOTE — Telephone Encounter (Signed)
Scheduled per 03/08 los, patient has been called and voicemail was left.

## 2020-02-04 ENCOUNTER — Other Ambulatory Visit: Payer: Self-pay | Admitting: Hematology

## 2020-02-04 DIAGNOSIS — D696 Thrombocytopenia, unspecified: Secondary | ICD-10-CM

## 2020-02-04 DIAGNOSIS — D45 Polycythemia vera: Secondary | ICD-10-CM

## 2020-02-23 ENCOUNTER — Inpatient Hospital Stay: Payer: Medicare PPO | Attending: Hematology

## 2020-02-23 ENCOUNTER — Other Ambulatory Visit: Payer: Self-pay

## 2020-02-23 DIAGNOSIS — I676 Nonpyogenic thrombosis of intracranial venous system: Secondary | ICD-10-CM | POA: Diagnosis not present

## 2020-02-23 DIAGNOSIS — D696 Thrombocytopenia, unspecified: Secondary | ICD-10-CM

## 2020-02-23 DIAGNOSIS — Z7901 Long term (current) use of anticoagulants: Secondary | ICD-10-CM | POA: Diagnosis not present

## 2020-02-23 DIAGNOSIS — D751 Secondary polycythemia: Secondary | ICD-10-CM | POA: Diagnosis not present

## 2020-02-23 DIAGNOSIS — I10 Essential (primary) hypertension: Secondary | ICD-10-CM | POA: Diagnosis not present

## 2020-02-23 DIAGNOSIS — D45 Polycythemia vera: Secondary | ICD-10-CM | POA: Diagnosis present

## 2020-02-23 DIAGNOSIS — Z87891 Personal history of nicotine dependence: Secondary | ICD-10-CM | POA: Diagnosis not present

## 2020-02-23 DIAGNOSIS — D72829 Elevated white blood cell count, unspecified: Secondary | ICD-10-CM | POA: Insufficient documentation

## 2020-02-23 DIAGNOSIS — G43909 Migraine, unspecified, not intractable, without status migrainosus: Secondary | ICD-10-CM | POA: Insufficient documentation

## 2020-02-23 DIAGNOSIS — D473 Essential (hemorrhagic) thrombocythemia: Secondary | ICD-10-CM | POA: Insufficient documentation

## 2020-02-23 LAB — CBC WITH DIFFERENTIAL/PLATELET
Abs Immature Granulocytes: 0.13 10*3/uL — ABNORMAL HIGH (ref 0.00–0.07)
Basophils Absolute: 0.2 10*3/uL — ABNORMAL HIGH (ref 0.0–0.1)
Basophils Relative: 3 %
Eosinophils Absolute: 0.4 10*3/uL (ref 0.0–0.5)
Eosinophils Relative: 5 %
HCT: 32.7 % — ABNORMAL LOW (ref 39.0–52.0)
Hemoglobin: 10.3 g/dL — ABNORMAL LOW (ref 13.0–17.0)
Immature Granulocytes: 2 %
Lymphocytes Relative: 22 %
Lymphs Abs: 1.6 10*3/uL (ref 0.7–4.0)
MCH: 32.5 pg (ref 26.0–34.0)
MCHC: 31.5 g/dL (ref 30.0–36.0)
MCV: 103.2 fL — ABNORMAL HIGH (ref 80.0–100.0)
Monocytes Absolute: 1.1 10*3/uL — ABNORMAL HIGH (ref 0.1–1.0)
Monocytes Relative: 15 %
Neutro Abs: 3.7 10*3/uL (ref 1.7–7.7)
Neutrophils Relative %: 53 %
Platelets: 479 10*3/uL — ABNORMAL HIGH (ref 150–400)
RBC: 3.17 MIL/uL — ABNORMAL LOW (ref 4.22–5.81)
RDW: 21.8 % — ABNORMAL HIGH (ref 11.5–15.5)
WBC: 7.1 10*3/uL (ref 4.0–10.5)
nRBC: 0.4 % — ABNORMAL HIGH (ref 0.0–0.2)

## 2020-02-23 LAB — IMMATURE PLATELET FRACTION: Immature Platelet Fraction: 5.6 % (ref 1.2–8.6)

## 2020-02-23 LAB — RETICULOCYTES
Immature Retic Fract: 36.9 % — ABNORMAL HIGH (ref 2.3–15.9)
RBC.: 3.17 MIL/uL — ABNORMAL LOW (ref 4.22–5.81)
Retic Count, Absolute: 89.4 10*3/uL (ref 19.0–186.0)
Retic Ct Pct: 2.8 % (ref 0.4–3.1)

## 2020-02-24 ENCOUNTER — Inpatient Hospital Stay (HOSPITAL_BASED_OUTPATIENT_CLINIC_OR_DEPARTMENT_OTHER): Payer: Medicare PPO | Admitting: Hematology

## 2020-02-24 DIAGNOSIS — G43909 Migraine, unspecified, not intractable, without status migrainosus: Secondary | ICD-10-CM | POA: Diagnosis not present

## 2020-02-24 DIAGNOSIS — D72829 Elevated white blood cell count, unspecified: Secondary | ICD-10-CM | POA: Diagnosis not present

## 2020-02-24 DIAGNOSIS — D45 Polycythemia vera: Secondary | ICD-10-CM | POA: Diagnosis not present

## 2020-02-24 DIAGNOSIS — I676 Nonpyogenic thrombosis of intracranial venous system: Secondary | ICD-10-CM | POA: Diagnosis not present

## 2020-02-24 DIAGNOSIS — Z87891 Personal history of nicotine dependence: Secondary | ICD-10-CM | POA: Diagnosis not present

## 2020-02-24 DIAGNOSIS — D473 Essential (hemorrhagic) thrombocythemia: Secondary | ICD-10-CM | POA: Diagnosis not present

## 2020-02-24 DIAGNOSIS — Z7901 Long term (current) use of anticoagulants: Secondary | ICD-10-CM | POA: Diagnosis not present

## 2020-02-24 DIAGNOSIS — I10 Essential (primary) hypertension: Secondary | ICD-10-CM | POA: Diagnosis not present

## 2020-02-24 DIAGNOSIS — D751 Secondary polycythemia: Secondary | ICD-10-CM | POA: Diagnosis not present

## 2020-02-24 NOTE — Progress Notes (Signed)
HEMATOLOGY/ONCOLOGY CLINIC NOTE  Date of Service: 02/24/2020  Patient Care Team: [name], MD as PCP - General (Family Medicine)  CHIEF COMPLAINTS/PURPOSE OF CONSULTATION:  Concern for a myeloproliferative neoplasm likely polycythemia vera or essential thrombocytosis  HISTORY OF PRESENTING ILLNESS:  Dillon Hill is a wonderful 74 y.o. male with medical history significant for migraine headaches benign prostatic hypertrophy and hyperlipidemia who presented to the [facility] in [facility] with large hematoma to left arm.  He was noted to have thrombocytopenia.  Other concerns will be noted.  He has been transferred for further work-up here ED Course: In the emergency room patient had work-up including including which was concerning for possible dural venous sinus thrombosis and MRI with and without contrast and MRV to evaluate was slated to be done at [facility] long. Patient had no significant symptoms of any kind.   INTERVAL HISTORY:  I connected with  Dillon Hill on 02/24/20 by telephone and verified that I am speaking with the correct person using two identifiers.   I discussed the limitations of evaluation and management by telemedicine. The patient expressed understanding and agreed to proceed.  Other persons participating in the visit and their role in the encounter:       -Yevette Edwards, Medical Scribe  Patient's location: Home Provider's location: [place] is a wonderful 74 y.o. male is here for the evaluation and management of polycythemia vera. The patient's last visit with [name] was on 01/25/2020. The pt reports that he is doing well overall.  The pt reports has been having intermittent headaches. He has continued taking Eliquis and Folic acid as prescribed. Pt's BP have been well controlled. Pt has been taking Hydralazine and Amlodipine.   Pt has received both doses of the COVID19 vaccine.   Lab results (02/23/20) of CBC w/diff is as  follows: all values are WNL except for RBC at 3.17, Hgb at 10.3, HCT at 32.7, MCV at 103.2, RDW at 21.8, PLT at 479K, nRBC at 0.4, Mono Abs at 1.1K, Baso Abs at 0.2K, Abs Immature Granulocytes at 0.13K. 02/23/2020 Reticulocytes is as follows: Retic Ct Pct at 2.8, RBC at 3.17, Retic Ct Abs at 89.4, Immature Retic Frat at 36.9. 02/23/2020 Immature Platelet Fraction at 5.6.  On review of systems, pt reports headaches and denies any other symptoms.   MEDICAL HISTORY:  Past Medical History:  Diagnosis Date  . BPH (benign prostatic hyperplasia)   . Colon polyp   . DDD (degenerative disc disease), cervical   . High cholesterol   . Migraine   . Polycythemia vera ([place])     SURGICAL HISTORY: Past Surgical History:  Procedure Laterality Date  . EYE SURGERY      SOCIAL HISTORY: Social History   Socioeconomic History  . Marital status: Married    Spouse name: Not on file  . Number of children: Not on file  . Years of education: Not on file  . Highest education level: Not on file  Occupational History  . Not on file  Tobacco Use  . Smoking status: Never Smoker  . Smokeless tobacco: Never Used  . Tobacco comment: smoked x 1 year at age 34  Substance and Sexual Activity  . Alcohol use: Yes    Comment: occ  . Drug use: Never  . Sexual activity: Not on file  Other Topics Concern  . Not on file  Social History Narrative  . Not on file   Social Determinants of  Health   Financial Resource Strain:   . Difficulty of Paying Living Expenses:   Food Insecurity:   . Worried About Charity fundraiser in the Last Year:   . Arboriculturist in the Last Year:   Transportation Needs:   . Film/video editor (Medical):   [name] Lack of Transportation (Non-Medical):   Physical Activity:   . Days of Exercise per Week:   . Minutes of Exercise per Session:   Stress:   . Feeling of Stress :   Social Connections:   . Frequency of Communication with Friends and Family:   . Frequency of Social  Gatherings with Friends and Family:   . Attends Religious Services:   . Active Member of Clubs or Organizations:   . Attends Archivist Meetings:   [name] Marital Status:   Intimate Partner Violence:   . Fear of Current or Ex-Partner:   . Emotionally Abused:   [name] Physically Abused:   . Sexually Abused:     FAMILY HISTORY: No family history on file.  ALLERGIES:  has No Known Allergies.  MEDICATIONS:  Current Outpatient Medications  Medication Sig Dispense Refill  . amLODipine (NORVASC) 10 MG tablet Take 1 tablet (10 mg total) by mouth daily. 30 tablet 0  . apixaban (ELIQUIS) 5 MG TABS tablet Take 1 tablet (5 mg total) by mouth 2 (two) times daily. 60 tablet 2  . folic acid (FOLVITE) 1 MG tablet TAKE 1 TABLET(1 MG) BY MOUTH DAILY 90 tablet 0  . hydrALAZINE (APRESOLINE) 25 MG tablet Take 1 tablet (25 mg total) by mouth every 12 (twelve) hours. 90 tablet 0  . hydroxyurea (HYDREA) 500 MG capsule 2 tabs (1000 mg) po once daily on Monday/wednesday and Friday and 1 tab (500mg ) po once daily on Tuesday/thursday/saturday/sunday.May take with food to minimize GI side effects. 60 capsule 3  . Multiple Vitamin (MULTIVITAMIN WITH MINERALS) TABS tablet Take 1 tablet by mouth daily.    . nortriptyline (PAMELOR) 10 MG capsule Take 1 capsule (10 mg total) by mouth at bedtime. 30 capsule 1  . SUMAtriptan (IMITREX) 100 MG tablet Take 100 mg by mouth as needed for migraine or headache.   1   No current facility-administered medications for this visit.    REVIEW OF SYSTEMS:   A 10+ POINT REVIEW OF SYSTEMS WAS OBTAINED including neurology, dermatology, psychiatry, cardiac, respiratory, lymph, extremities, GI, GU, Musculoskeletal, constitutional, breasts, reproductive, HEENT.  All pertinent positives are noted in the HPI.  All others are negative.   PHYSICAL EXAMINATION:  Telehealth visit  LABORATORY DATA:  I have reviewed the data as listed  . CBC Latest Ref Rng & Units 02/23/2020 01/19/2020  01/12/2020  WBC 4.0 - 10.5 K/uL 7.1 3.1(L) 1.6(L)  Hemoglobin 13.0 - 17.0 g/dL 10.3(L) 9.0(L) 9.1(L)  Hematocrit 39.0 - 52.0 % 32.7(L) 27.7(L) 28.8(L)  Platelets 150 - 400 K/uL 479(H) 122(L) 72(L)    . CMP Latest Ref Rng & Units 12/28/2019 11/25/2019 11/03/2019  Glucose 70 - 99 mg/dL 71 81 118(H)  BUN 8 - 23 mg/dL 27(H) 23 23  Creatinine 0.61 - 1.24 mg/dL 1.32(H) 1.34(H) 1.43(H)  Sodium 135 - 145 mmol/L 141 141 138  Potassium 3.5 - 5.1 mmol/L 4.3 4.5 3.7  Chloride 98 - 111 mmol/L 106 103 102  CO2 22 - 32 mmol/L 28 30 27   Calcium 8.9 - 10.3 mg/dL 9.1 9.1 9.2  Total Protein 6.5 - 8.1 g/dL 6.8 7.0 7.0  Total Bilirubin 0.3 - 1.2  mg/dL 0.7 0.6 0.8  Alkaline Phos 38 - 126 U/L 110 102 127(H)  AST 15 - 41 U/L 26 26 32  ALT 0 - 44 U/L 19 15 22    01/19/2020 [id number] Flow Pathology (BM Bx):   01/19/2020 [id number] BM Bx Report:    10/24/2019 JAK2 mutation testing:    RADIOGRAPHIC STUDIES: I have personally reviewed the radiological images as listed and agreed with the findings in the report. No results found.  ASSESSMENT & PLAN:   #1Severe thrombocytosis with platelets of 1005k in diagnosis 10/24/2019 JAK2 GenotypR is "POSITIVE for the detection of the V617F mutation" #2 leukocytosis/primary neutrophilia. #3 polycythemia with a hematocrit of 51.7. #4 Dural venous sinus thrombosis -likely triggered by his polycythemia and thrombocytosis. #5 left arm hematoma likely related to a bicipital tendon injury. 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 10/24/2019 CALR Mutation analysis is "Negative"  10/24/2019 BCR ABL is "Negative"  01/19/2020 BM Bx ([id number]) revealed "BONE MARROW, ASPIRATE, CLOT, CORE:  - Normocellular marrow with  megakaryocytic hyperplasia and atypia  - Documented JAK-2 (V617F) mutation. PERIPHERAL BLOOD: - Pancytopenia." 01/19/2020 BM Bx Flow Pathology ([id number]) revealed "No monoclonal B-cell or phenotypically apparent T-cell, or distinct blast population identified."  PLAN: -Discussed pt labwork today, 02/23/20; WBC have normalized, Hgb has improved, and PLT are increasing  -Will begin pt on 500 mg Hydroxyurea M-Th (four times per week). He has no prohibitive toxicities at this time. -Recommended that the pt drink at least 48-64 oz of water each day to prevent headache from dehydration.  -Advised pt that Hydralazine is known to cause headaches. Could discuss with PCP. -Continue HTN medication as prescribed -Continue Eliquis and Folic Acid -Recommend pt continue daily multivitamin   -Advised pt to avoid any OTC pain medications -Will see back in 1 month, with labs one day prior -Will see back via phone    FOLLOW UP:  Phone visit and labs in 4 weeks. Plz schedule labs 1 day prior to phone visit   The total time spent in the appt was 20 minutes and more than 50% was on counseling and direct patient cares.  All of the patient's questions were answered with apparent satisfaction. The patient knows to call the clinic with any problems, questions or concerns.    Sullivan Lone MD [place] AAHIVMS [facility] Hematology/Oncology Physician [facility]  (Office):       [phone] (Work cell):  [phone] (Fax):           [phone]  02/24/2020 8:41 AM  I, Yevette Edwards, am acting as a scribe for Dr. Sullivan Lone.   .I have reviewed the above documentation for accuracy and completeness, and I agree with the above. Brunetta Genera MD

## 2020-02-26 ENCOUNTER — Telehealth: Payer: Self-pay | Admitting: Hematology

## 2020-02-26 ENCOUNTER — Telehealth: Payer: Self-pay | Admitting: Hematology and Oncology

## 2020-02-26 NOTE — Telephone Encounter (Signed)
Scheduled per los, patient has been called and notified. 

## 2020-02-26 NOTE — Telephone Encounter (Signed)
Scheduled per los, pagi

## 2020-03-22 ENCOUNTER — Other Ambulatory Visit: Payer: Self-pay

## 2020-03-22 ENCOUNTER — Inpatient Hospital Stay: Payer: Medicare PPO | Attending: Hematology

## 2020-03-22 DIAGNOSIS — D473 Essential (hemorrhagic) thrombocythemia: Secondary | ICD-10-CM | POA: Insufficient documentation

## 2020-03-22 DIAGNOSIS — D696 Thrombocytopenia, unspecified: Secondary | ICD-10-CM

## 2020-03-22 DIAGNOSIS — D45 Polycythemia vera: Secondary | ICD-10-CM

## 2020-03-22 LAB — CMP (CANCER CENTER ONLY)
ALT: 11 U/L (ref 0–44)
AST: 21 U/L (ref 15–41)
Albumin: 3.9 g/dL (ref 3.5–5.0)
Alkaline Phosphatase: 85 U/L (ref 38–126)
Anion gap: 3 — ABNORMAL LOW (ref 5–15)
BUN: 23 mg/dL (ref 8–23)
CO2: 28 mmol/L (ref 22–32)
Calcium: 9.4 mg/dL (ref 8.9–10.3)
Chloride: 107 mmol/L (ref 98–111)
Creatinine: 1.41 mg/dL — ABNORMAL HIGH (ref 0.61–1.24)
GFR, Est AFR Am: 57 mL/min — ABNORMAL LOW (ref >60)
GFR, Estimated: 49 mL/min — ABNORMAL LOW (ref >60)
Glucose, Bld: 76 mg/dL (ref 70–99)
Potassium: 3.9 mmol/L (ref 3.5–5.1)
Sodium: 138 mmol/L (ref 135–145)
Total Bilirubin: 0.7 mg/dL (ref 0.3–1.2)
Total Protein: 7.1 g/dL (ref 6.5–8.1)

## 2020-03-22 LAB — CBC WITH DIFFERENTIAL/PLATELET
Abs Immature Granulocytes: 0.04 10*3/uL (ref 0.00–0.07)
Basophils Absolute: 0.1 10*3/uL (ref 0.0–0.1)
Basophils Relative: 2 %
Eosinophils Absolute: 0.3 10*3/uL (ref 0.0–0.5)
Eosinophils Relative: 5 %
HCT: 35.4 % — ABNORMAL LOW (ref 39.0–52.0)
Hemoglobin: 11 g/dL — ABNORMAL LOW (ref 13.0–17.0)
Immature Granulocytes: 1 %
Lymphocytes Relative: 22 %
Lymphs Abs: 1.2 10*3/uL (ref 0.7–4.0)
MCH: 32.6 pg (ref 26.0–34.0)
MCHC: 31.1 g/dL (ref 30.0–36.0)
MCV: 105 fL — ABNORMAL HIGH (ref 80.0–100.0)
Monocytes Absolute: 0.8 10*3/uL (ref 0.1–1.0)
Monocytes Relative: 14 %
Neutro Abs: 3 10*3/uL (ref 1.7–7.7)
Neutrophils Relative %: 56 %
Platelets: 455 10*3/uL — ABNORMAL HIGH (ref 150–400)
RBC: 3.37 MIL/uL — ABNORMAL LOW (ref 4.22–5.81)
RDW: 18.1 % — ABNORMAL HIGH (ref 11.5–15.5)
WBC: 5.3 10*3/uL (ref 4.0–10.5)
nRBC: 0 % (ref 0.0–0.2)

## 2020-03-22 LAB — RETICULOCYTES
Immature Retic Fract: 29.5 % — ABNORMAL HIGH (ref 2.3–15.9)
RBC.: 3.35 MIL/uL — ABNORMAL LOW (ref 4.22–5.81)
Retic Count, Absolute: 88.4 10*3/uL (ref 19.0–186.0)
Retic Ct Pct: 2.6 % (ref 0.4–3.1)

## 2020-03-22 LAB — IMMATURE PLATELET FRACTION: Immature Platelet Fraction: 4.2 % (ref 1.2–8.6)

## 2020-03-22 LAB — LACTATE DEHYDROGENASE: LDH: 175 U/L (ref 98–192)

## 2020-03-23 ENCOUNTER — Ambulatory Visit: Payer: Medicare PPO | Admitting: Hematology and Oncology

## 2020-03-23 ENCOUNTER — Inpatient Hospital Stay (HOSPITAL_BASED_OUTPATIENT_CLINIC_OR_DEPARTMENT_OTHER): Payer: Medicare PPO | Admitting: Hematology

## 2020-03-23 DIAGNOSIS — D75839 Thrombocytosis, unspecified: Secondary | ICD-10-CM

## 2020-03-23 DIAGNOSIS — D471 Chronic myeloproliferative disease: Secondary | ICD-10-CM | POA: Diagnosis not present

## 2020-03-23 DIAGNOSIS — D473 Essential (hemorrhagic) thrombocythemia: Secondary | ICD-10-CM

## 2020-03-23 DIAGNOSIS — D45 Polycythemia vera: Secondary | ICD-10-CM | POA: Diagnosis not present

## 2020-03-23 MED ORDER — HYDROXYUREA 500 MG PO CAPS: 500.0000 mg | ORAL_CAPSULE | Freq: Every day | ORAL | 3 refills | Status: DC

## 2020-03-23 NOTE — Progress Notes (Signed)
HEMATOLOGY/ONCOLOGY CLINIC NOTE  Date of Service: 03/23/2020  Patient Care Team: [name], MD as PCP - General (Family Medicine)  CHIEF COMPLAINTS/PURPOSE OF CONSULTATION:  Concern for a myeloproliferative neoplasm likely polycythemia vera or essential thrombocytosis  HISTORY OF PRESENTING ILLNESS:  Dillon Hill is a wonderful 74 y.o. male with medical history significant for migraine headaches benign prostatic hypertrophy and hyperlipidemia who presented to the [facility] in [facility] with large hematoma to left arm.  He was noted to have thrombocytopenia.  Other concerns will be noted.  He has been transferred for further work-up here ED Course: In the emergency room patient had work-up including including which was concerning for possible dural venous sinus thrombosis and MRI with and without contrast and MRV to evaluate was slated to be done at [facility] long. Patient had no significant symptoms of any kind.   INTERVAL HISTORY:   I connected with  [name] on 03/23/20 by telephone and verified that I am speaking with the correct person using two identifiers.   I discussed the limitations of evaluation and management by telemedicine. The patient expressed understanding and agreed to proceed.  Other persons participating in the visit and their role in the encounter:        -Yevette Edwards, Medical Scribe  Patient's location: Home Provider's location: [place] is a wonderful 74 y.o. male is here for the evaluation and management of polycythemia vera. The patient's last visit with [name] was on 02/24/2020. The pt reports that he is doing well overall.  The pt reports that he has been taking Eliquis as prescribed and denies any bleeding concerns or new headaches. He has been taking 500 mg Hydroxyurea four days per week.   Lab results (03/22/20) of CBC w/diff and CMP is as follows: all values are WNL except for RBC at 3.37, Hgb at 11.0, HCT at 35.4,  MCV at 105.0, RDW at 18.1, PLT at 455K, Creatinine at 1.41, GFR Est Non Af Am at 49, Anion gap at 3. 03/22/2020 Reticulocytes is as follows: Retic Ct Pct at 2.6, RBC at 3.35, Retic Ct Abs at 88.4, Immature Retic Fract at 29.5 03/22/2020 Immature Platelet Fraction at 4.2 03/22/2020 LDH at 175  On review of systems, pt denies headaches, abnormal bleeding and any other symptoms.    MEDICAL HISTORY:  Past Medical History:  Diagnosis Date  . BPH (benign prostatic hyperplasia)   . Colon polyp   . DDD (degenerative disc disease), cervical   . High cholesterol   . Migraine   . Polycythemia vera ([place])     SURGICAL HISTORY: Past Surgical History:  Procedure Laterality Date  . EYE SURGERY      SOCIAL HISTORY: Social History   Socioeconomic History  . Marital status: Married    Spouse name: Not on file  . Number of children: Not on file  . Years of education: Not on file  . Highest education level: Not on file  Occupational History  . Not on file  Tobacco Use  . Smoking status: Never Smoker  . Smokeless tobacco: Never Used  . Tobacco comment: smoked x 1 year at age 79  Substance and Sexual Activity  . Alcohol use: Yes    Comment: occ  . Drug use: Never  . Sexual activity: Not on file  Other Topics Concern  . Not on file  Social History Narrative  . Not on file   Social Determinants of Health   Financial  Resource Strain:   . Difficulty of Paying Living Expenses:   Food Insecurity:   . Worried About Charity fundraiser in the Last Year:   . Arboriculturist in the Last Year:   Transportation Needs:   . Film/video editor (Medical):   [name] Lack of Transportation (Non-Medical):   Physical Activity:   . Days of Exercise per Week:   . Minutes of Exercise per Session:   Stress:   . Feeling of Stress :   Social Connections:   . Frequency of Communication with Friends and Family:   . Frequency of Social Gatherings with Friends and Family:   . Attends Religious Services:    . Active Member of Clubs or Organizations:   . Attends Archivist Meetings:   [name] Marital Status:   Intimate Partner Violence:   . Fear of Current or Ex-Partner:   . Emotionally Abused:   [name] Physically Abused:   . Sexually Abused:     FAMILY HISTORY: No family history on file.  ALLERGIES:  has No Known Allergies.  MEDICATIONS:  Current Outpatient Medications  Medication Sig Dispense Refill  . amLODipine (NORVASC) 10 MG tablet Take 1 tablet (10 mg total) by mouth daily. 30 tablet 0  . apixaban (ELIQUIS) 5 MG TABS tablet Take 1 tablet (5 mg total) by mouth 2 (two) times daily. 60 tablet 2  . folic acid (FOLVITE) 1 MG tablet TAKE 1 TABLET(1 MG) BY MOUTH DAILY 90 tablet 0  . hydrALAZINE (APRESOLINE) 25 MG tablet Take 1 tablet (25 mg total) by mouth every 12 (twelve) hours. 90 tablet 0  . hydroxyurea (HYDREA) 500 MG capsule Take 1 capsule (500 mg total) by mouth daily. May take with food to minimize GI side effects. 60 capsule 3  . Multiple Vitamin (MULTIVITAMIN WITH MINERALS) TABS tablet Take 1 tablet by mouth daily.    . nortriptyline (PAMELOR) 10 MG capsule Take 1 capsule (10 mg total) by mouth at bedtime. 30 capsule 1  . SUMAtriptan (IMITREX) 100 MG tablet Take 100 mg by mouth as needed for migraine or headache.   1   No current facility-administered medications for this visit.    REVIEW OF SYSTEMS:   A 10+ POINT REVIEW OF SYSTEMS WAS OBTAINED including neurology, dermatology, psychiatry, cardiac, respiratory, lymph, extremities, GI, GU, Musculoskeletal, constitutional, breasts, reproductive, HEENT.  All pertinent positives are noted in the HPI.  All others are negative.   PHYSICAL EXAMINATION:  Telehealth visit  LABORATORY DATA:  I have reviewed the data as listed  . CBC Latest Ref Rng & Units 03/22/2020 02/23/2020 01/19/2020  WBC 4.0 - 10.5 K/uL 5.3 7.1 3.1(L)  Hemoglobin 13.0 - 17.0 g/dL 11.0(L) 10.3(L) 9.0(L)  Hematocrit 39.0 - 52.0 % 35.4(L) 32.7(L) 27.7(L)    Platelets 150 - 400 K/uL 455(H) 479(H) 122(L)    . CMP Latest Ref Rng & Units 03/22/2020 12/28/2019 11/25/2019  Glucose 70 - 99 mg/dL 76 71 81  BUN 8 - 23 mg/dL 23 27(H) 23  Creatinine 0.61 - 1.24 mg/dL 1.41(H) 1.32(H) 1.34(H)  Sodium 135 - 145 mmol/L 138 141 141  Potassium 3.5 - 5.1 mmol/L 3.9 4.3 4.5  Chloride 98 - 111 mmol/L 107 106 103  CO2 22 - 32 mmol/L 28 28 30   Calcium 8.9 - 10.3 mg/dL 9.4 9.1 9.1  Total Protein 6.5 - 8.1 g/dL 7.1 6.8 7.0  Total Bilirubin 0.3 - 1.2 mg/dL 0.7 0.7 0.6  Alkaline Phos 38 - 126 U/L 85 110  102  AST 15 - 41 U/L 21 26 26   ALT 0 - 44 U/L 11 19 15    01/19/2020 [id number] Flow Pathology (BM Bx):   01/19/2020 [id number] BM Bx Report:    10/24/2019 JAK2 mutation testing:    RADIOGRAPHIC STUDIES: I have personally reviewed the radiological images as listed and agreed with the findings in the report. No results found.  ASSESSMENT & PLAN:   #1Severe thrombocytosis with platelets of 1005k in diagnosis 10/24/2019 JAK2 GenotypR is "POSITIVE for the detection of the V617F mutation" #2 leukocytosis/primary neutrophilia. #3 polycythemia with a hematocrit of 51.7. #4 Dural venous sinus thrombosis -likely triggered by his polycythemia and thrombocytosis. #5 left arm hematoma likely related to a bicipital tendon injury. 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 10/24/2019 CALR Mutation analysis is "Negative"  10/24/2019 BCR ABL is "Negative"  01/19/2020 BM Bx ([id number]) revealed "BONE MARROW, ASPIRATE, CLOT, CORE:  - Normocellular marrow with megakaryocytic hyperplasia and atypia  - Documented JAK-2 (V617F) mutation. PERIPHERAL BLOOD: - Pancytopenia." 01/19/2020 BM Bx Flow Pathology ([id number])  revealed "No monoclonal B-cell or phenotypically apparent T-cell, or distinct blast population identified."  PLAN:  -Discussed pt labwork, 03/22/20; all values are WNL except for RBC at 3.37, Hgb at 11.0, HCT at 35.4, MCV at 105.0, RDW at 18.1, PLT at 455K, Creatinine at 1.41, GFR Est Non Af Am at 49, Anion gap at 3. -Discussed 03/22/2020 Reticulocytes is as follows: Retic Ct Pct at 2.6, RBC at 3.35, Retic Ct Abs at 88.4, Immature Retic Fract at 29.5 -Discussed 03/22/2020 Immature Platelet Fraction at 4.2 -Discussed 03/22/2020 LDH at 175 -No indication for a therapeutic phlebotomy at this time as HCT< 45 -Advised the pt to begin taking 500 mg Hydroxyurea M-Sun (seven days per week). He has no prohibitive toxicities at this time. -Continue HTN medication as prescribed -Continue Eliquis and Folic Acid -Recommend pt continue daily multivitamin   -Will see back in 6 weeks via phone, with labs 1 day prior     FOLLOW UP:  Phone visit and labs in 6 weeks. Plz schedule labs 1 day prior to phone visit    The total time spent in the appt was 20 minutes and more than 50% was on counseling and direct patient cares.  All of the patient's questions were answered with apparent satisfaction. The patient knows to call the clinic with any problems, questions or concerns.    Sullivan Lone MD [place] AAHIVMS [facility] Hematology/Oncology Physician [facility]  (Office):       [phone] (Work cell):  [phone] (Fax):           [phone]  03/23/2020 10:16 AM   I, Yevette Edwards, am acting as a scribe for Dr. Sullivan Lone.   .I have reviewed the above documentation for accuracy and completeness, and I agree with the above. Brunetta Genera MD

## 2020-03-25 ENCOUNTER — Telehealth: Payer: Self-pay | Admitting: Hematology

## 2020-03-25 NOTE — Telephone Encounter (Signed)
Scheduled per 05/05 los, patient has been called and notified.  

## 2020-05-03 ENCOUNTER — Telehealth: Payer: Self-pay | Admitting: Hematology

## 2020-05-03 ENCOUNTER — Inpatient Hospital Stay: Payer: Medicare PPO | Attending: Hematology

## 2020-05-03 ENCOUNTER — Other Ambulatory Visit: Payer: Self-pay

## 2020-05-03 DIAGNOSIS — D473 Essential (hemorrhagic) thrombocythemia: Secondary | ICD-10-CM | POA: Insufficient documentation

## 2020-05-03 DIAGNOSIS — Z79899 Other long term (current) drug therapy: Secondary | ICD-10-CM | POA: Diagnosis not present

## 2020-05-03 DIAGNOSIS — Z7901 Long term (current) use of anticoagulants: Secondary | ICD-10-CM | POA: Diagnosis not present

## 2020-05-03 DIAGNOSIS — D45 Polycythemia vera: Secondary | ICD-10-CM

## 2020-05-03 DIAGNOSIS — E7800 Pure hypercholesterolemia, unspecified: Secondary | ICD-10-CM | POA: Diagnosis not present

## 2020-05-03 LAB — CBC WITH DIFFERENTIAL/PLATELET
Abs Immature Granulocytes: 0.02 10*3/uL (ref 0.00–0.07)
Basophils Absolute: 0.1 10*3/uL (ref 0.0–0.1)
Basophils Relative: 2 %
Eosinophils Absolute: 0.1 10*3/uL (ref 0.0–0.5)
Eosinophils Relative: 3 %
HCT: 35.8 % — ABNORMAL LOW (ref 39.0–52.0)
Hemoglobin: 11.8 g/dL — ABNORMAL LOW (ref 13.0–17.0)
Immature Granulocytes: 1 %
Lymphocytes Relative: 26 %
Lymphs Abs: 1.1 10*3/uL (ref 0.7–4.0)
MCH: 32.4 pg (ref 26.0–34.0)
MCHC: 33 g/dL (ref 30.0–36.0)
MCV: 98.4 fL (ref 80.0–100.0)
Monocytes Absolute: 0.7 10*3/uL (ref 0.1–1.0)
Monocytes Relative: 16 %
Neutro Abs: 2.2 10*3/uL (ref 1.7–7.7)
Neutrophils Relative %: 52 %
Platelets: 115 10*3/uL — ABNORMAL LOW (ref 150–400)
RBC: 3.64 MIL/uL — ABNORMAL LOW (ref 4.22–5.81)
RDW: 19.3 % — ABNORMAL HIGH (ref 11.5–15.5)
WBC: 4.2 10*3/uL (ref 4.0–10.5)
nRBC: 0 % (ref 0.0–0.2)

## 2020-05-03 LAB — CMP (CANCER CENTER ONLY)
ALT: 13 U/L (ref 0–44)
AST: 21 U/L (ref 15–41)
Albumin: 3.9 g/dL (ref 3.5–5.0)
Alkaline Phosphatase: 80 U/L (ref 38–126)
Anion gap: 7 (ref 5–15)
BUN: 18 mg/dL (ref 8–23)
CO2: 25 mmol/L (ref 22–32)
Calcium: 9.3 mg/dL (ref 8.9–10.3)
Chloride: 108 mmol/L (ref 98–111)
Creatinine: 1.37 mg/dL — ABNORMAL HIGH (ref 0.61–1.24)
GFR, Est AFR Am: 59 mL/min — ABNORMAL LOW (ref >60)
GFR, Estimated: 51 mL/min — ABNORMAL LOW (ref >60)
Glucose, Bld: 88 mg/dL (ref 70–99)
Potassium: 4.4 mmol/L (ref 3.5–5.1)
Sodium: 140 mmol/L (ref 135–145)
Total Bilirubin: 0.5 mg/dL (ref 0.3–1.2)
Total Protein: 7.2 g/dL (ref 6.5–8.1)

## 2020-05-03 NOTE — Telephone Encounter (Signed)
Contacted patient to verify phone visit for pre reg °

## 2020-05-04 ENCOUNTER — Inpatient Hospital Stay (HOSPITAL_BASED_OUTPATIENT_CLINIC_OR_DEPARTMENT_OTHER): Payer: Medicare PPO | Admitting: Hematology

## 2020-05-04 DIAGNOSIS — D75839 Thrombocytosis, unspecified: Secondary | ICD-10-CM

## 2020-05-04 DIAGNOSIS — E7800 Pure hypercholesterolemia, unspecified: Secondary | ICD-10-CM | POA: Diagnosis not present

## 2020-05-04 DIAGNOSIS — D473 Essential (hemorrhagic) thrombocythemia: Secondary | ICD-10-CM

## 2020-05-04 DIAGNOSIS — D45 Polycythemia vera: Secondary | ICD-10-CM

## 2020-05-04 DIAGNOSIS — Z79899 Other long term (current) drug therapy: Secondary | ICD-10-CM | POA: Diagnosis not present

## 2020-05-04 DIAGNOSIS — Z7901 Long term (current) use of anticoagulants: Secondary | ICD-10-CM | POA: Diagnosis not present

## 2020-05-04 MED ORDER — HYDROXYUREA 500 MG PO CAPS: ORAL_CAPSULE | ORAL | 2 refills | Status: DC

## 2020-05-04 NOTE — Progress Notes (Signed)
HEMATOLOGY/ONCOLOGY CLINIC NOTE  Date of Service: 05/04/2020  Patient Care Team: [name], MD as PCP - General (Family Medicine)  CHIEF COMPLAINTS/PURPOSE OF CONSULTATION:  Concern for a myeloproliferative neoplasm likely polycythemia vera or essential thrombocytosis  HISTORY OF PRESENTING ILLNESS:  Ibn Stief is a wonderful 74 y.o. male with medical history significant for migraine headaches benign prostatic hypertrophy and hyperlipidemia who presented to the [facility] in [facility] with large hematoma to left arm.  He was noted to have thrombocytopenia.  Other concerns will be noted.  He has been transferred for further work-up here ED Course: In the emergency room patient had work-up including including which was concerning for possible dural venous sinus thrombosis and MRI with and without contrast and MRV to evaluate was slated to be done at [facility] long. Patient had no significant symptoms of any kind.   INTERVAL HISTORY:   I connected with  [name] on 05/04/20 by telephone and verified that I am speaking with the correct person using two identifiers.   I discussed the limitations of evaluation and management by telemedicine. The patient expressed understanding and agreed to proceed.  Other persons participating in the visit and their role in the encounter:      -Yevette Edwards, Medical Scribe  Patient's location: Home Provider's location: [place] at [place] is a wonderful 74 y.o. male is here for the evaluation and management of polycythemia vera. The patient's last visit with [name] was on 03/23/2020. The pt reports that he is doing well overall.  The pt reports that he has continued taking Hydralazine and Amlodipine but has noticed that his BP has been low. He has otherwise been well and has no new concerns.   Lab results (05/03/20) of CBC w/diff and CMP is as follows: all values are WNL except for RBC at 3.64, Hgb at 11.8, HCT at 35.8, RDW at  19.3, PLT at 115K, Creatinine at 1.37, GFR Est Non Af Am at 51.  On review of systems, pt denies bruising, abnormal/excessive bleeding and any other symptoms.    MEDICAL HISTORY:  Past Medical History:  Diagnosis Date  . BPH (benign prostatic hyperplasia)   . Colon polyp   . DDD (degenerative disc disease), cervical   . High cholesterol   . Migraine   . Polycythemia vera ([place])     SURGICAL HISTORY: Past Surgical History:  Procedure Laterality Date  . EYE SURGERY      SOCIAL HISTORY: Social History   Socioeconomic History  . Marital status: Married    Spouse name: Not on file  . Number of children: Not on file  . Years of education: Not on file  . Highest education level: Not on file  Occupational History  . Not on file  Tobacco Use  . Smoking status: Never Smoker  . Smokeless tobacco: Never Used  . Tobacco comment: smoked x 1 year at age 22  Vaping Use  . Vaping Use: Never used  Substance and Sexual Activity  . Alcohol use: Yes    Comment: occ  . Drug use: Never  . Sexual activity: Not on file  Other Topics Concern  . Not on file  Social History Narrative  . Not on file   Social Determinants of Health   Financial Resource Strain:   . Difficulty of Paying Living Expenses:   Food Insecurity:   . Worried About Charity fundraiser in the Last Year:   . [employer] of  Food in the Last Year:   Transportation Needs:   . Film/video editor (Medical):   [name] Lack of Transportation (Non-Medical):   Physical Activity:   . Days of Exercise per Week:   . Minutes of Exercise per Session:   Stress:   . Feeling of Stress :   Social Connections:   . Frequency of Communication with Friends and Family:   . Frequency of Social Gatherings with Friends and Family:   . Attends Religious Services:   . Active Member of Clubs or Organizations:   . Attends Archivist Meetings:   [name] Marital Status:   Intimate Partner Violence:   . Fear of Current or Ex-Partner:   .  Emotionally Abused:   [name] Physically Abused:   . Sexually Abused:     FAMILY HISTORY: No family history on file.  ALLERGIES:  has No Known Allergies.  MEDICATIONS:  Current Outpatient Medications  Medication Sig Dispense Refill  . amLODipine (NORVASC) 10 MG tablet Take 1 tablet (10 mg total) by mouth daily. 30 tablet 0  . apixaban (ELIQUIS) 5 MG TABS tablet Take 1 tablet (5 mg total) by mouth 2 (two) times daily. 60 tablet 2  . folic acid (FOLVITE) 1 MG tablet TAKE 1 TABLET(1 MG) BY MOUTH DAILY 90 tablet 0  . hydroxyurea (HYDREA) 500 MG capsule Take 1 cap (500mg ) orally daily for 6 days a week (hold on sundays).May take with food to minimize GI side effects. 60 capsule 2  . Multiple Vitamin (MULTIVITAMIN WITH MINERALS) TABS tablet Take 1 tablet by mouth daily.    . SUMAtriptan (IMITREX) 100 MG tablet Take 100 mg by mouth as needed for migraine or headache.   1   No current facility-administered medications for this visit.    REVIEW OF SYSTEMS:   A 10+ POINT REVIEW OF SYSTEMS WAS OBTAINED including neurology, dermatology, psychiatry, cardiac, respiratory, lymph, extremities, GI, GU, Musculoskeletal, constitutional, breasts, reproductive, HEENT.  All pertinent positives are noted in the HPI.  All others are negative.    PHYSICAL EXAMINATION:  Telehealth visit  LABORATORY DATA:  I have reviewed the data as listed  . CBC Latest Ref Rng & Units 05/03/2020 03/22/2020 02/23/2020  WBC 4.0 - 10.5 K/uL 4.2 5.3 7.1  Hemoglobin 13.0 - 17.0 g/dL 11.8(L) 11.0(L) 10.3(L)  Hematocrit 39 - 52 % 35.8(L) 35.4(L) 32.7(L)  Platelets 150 - 400 K/uL 115(L) 455(H) 479(H)    . CMP Latest Ref Rng & Units 05/03/2020 03/22/2020 12/28/2019  Glucose 70 - 99 mg/dL 88 76 71  BUN 8 - 23 mg/dL 18 23 27(H)  Creatinine 0.61 - 1.24 mg/dL 1.37(H) 1.41(H) 1.32(H)  Sodium 135 - 145 mmol/L 140 138 141  Potassium 3.5 - 5.1 mmol/L 4.4 3.9 4.3  Chloride 98 - 111 mmol/L 108 107 106  CO2 22 - 32 mmol/L 25 28 28   Calcium 8.9  - 10.3 mg/dL 9.3 9.4 9.1  Total Protein 6.5 - 8.1 g/dL 7.2 7.1 6.8  Total Bilirubin 0.3 - 1.2 mg/dL 0.5 0.7 0.7  Alkaline Phos 38 - 126 U/L 80 85 110  AST 15 - 41 U/L 21 21 26   ALT 0 - 44 U/L 13 11 19    01/19/2020 [id number] Flow Pathology (BM Bx):   01/19/2020 [id number] BM Bx Report:    10/24/2019 JAK2 mutation testing:    RADIOGRAPHIC STUDIES: I have personally reviewed the radiological images as listed and agreed with the findings in the report. No results found.  ASSESSMENT & PLAN:   #  1Severe thrombocytosis with platelets of 1005k in diagnosis 10/24/2019 JAK2 GenotypR is "POSITIVE for the detection of the V617F mutation" #2 leukocytosis/primary neutrophilia. #3 polycythemia with a hematocrit of 51.7. #4 Dural venous sinus thrombosis -likely triggered by his polycythemia and thrombocytosis. #5 left arm hematoma likely related to a bicipital tendon injury. 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 10/24/2019 CALR Mutation analysis is "Negative"  10/24/2019 BCR ABL is "Negative"  01/19/2020 BM Bx ([id number]) revealed "BONE MARROW, ASPIRATE, CLOT, CORE:  - Normocellular marrow with megakaryocytic hyperplasia and atypia  - Documented JAK-2 (V617F) mutation. PERIPHERAL BLOOD: - Pancytopenia." 01/19/2020 BM Bx Flow Pathology ([id number]) revealed "No monoclonal B-cell or phenotypically apparent T-cell, or distinct blast population identified."  PLAN:  -Discussed pt labwork, 05/03/20; PLT are low, WBC are nml, anemia is improving, kidney function is stable  -No indication for a therapeutic phlebotomy at this time as HCT< 45 -Advised pt that if his BP continues to be below 120/80 in the morning he can discontinue  Hydralazine.  -Will reduce Hydroxyurea dose from 500 mg daily to 500 mg 6x per week due to dropping PLT. The pt has no prohibitive toxicities from continuing Hydroxyurea at this time.  -Continue Eliquis and Folic acid -Recommend pt connect with PCP ASAP for HTN management -Will see back in 8 weeks via phone, with labs 1 day prior   FOLLOW UP:  Phone visit and labs in 8 weeks. Plz schedule labs 1 day prior to phone visit   The total time spent in the appt was 20 minutes and more than 50% was on counseling and direct patient cares.  All of the patient's questions were answered with apparent satisfaction. The patient knows to call the clinic with any problems, questions or concerns.    Sullivan Lone MD [place] AAHIVMS [facility] Hematology/Oncology Physician [facility]  (Office):       [phone] (Work cell):  [phone] (Fax):           [phone]  05/04/2020 11:33 AM  I, Yevette Edwards, am acting as a scribe for Dr. Sullivan Lone.   .I have reviewed the above documentation for accuracy and completeness, and I agree with the above. Brunetta Genera MD

## 2020-05-08 ENCOUNTER — Other Ambulatory Visit: Payer: Self-pay | Admitting: Hematology

## 2020-05-08 DIAGNOSIS — D696 Thrombocytopenia, unspecified: Secondary | ICD-10-CM

## 2020-05-08 DIAGNOSIS — D45 Polycythemia vera: Secondary | ICD-10-CM

## 2020-05-19 ENCOUNTER — Other Ambulatory Visit: Payer: Self-pay | Admitting: Hematology

## 2020-06-28 ENCOUNTER — Inpatient Hospital Stay: Payer: Medicare PPO | Attending: Hematology

## 2020-06-28 ENCOUNTER — Other Ambulatory Visit: Payer: Self-pay

## 2020-06-28 DIAGNOSIS — D473 Essential (hemorrhagic) thrombocythemia: Secondary | ICD-10-CM | POA: Diagnosis not present

## 2020-06-28 DIAGNOSIS — D45 Polycythemia vera: Secondary | ICD-10-CM

## 2020-06-28 DIAGNOSIS — D75839 Thrombocytosis, unspecified: Secondary | ICD-10-CM

## 2020-06-28 LAB — CMP (CANCER CENTER ONLY)
ALT: 7 U/L (ref 0–44)
AST: 17 U/L (ref 15–41)
Albumin: 3.7 g/dL (ref 3.5–5.0)
Alkaline Phosphatase: 74 U/L (ref 38–126)
Anion gap: 7 (ref 5–15)
BUN: 20 mg/dL (ref 8–23)
CO2: 25 mmol/L (ref 22–32)
Calcium: 9.6 mg/dL (ref 8.9–10.3)
Chloride: 108 mmol/L (ref 98–111)
Creatinine: 1.29 mg/dL — ABNORMAL HIGH (ref 0.61–1.24)
GFR, Est AFR Am: >60 mL/min (ref >60)
GFR, Estimated: 55 mL/min — ABNORMAL LOW (ref >60)
Glucose, Bld: 81 mg/dL (ref 70–99)
Potassium: 4.3 mmol/L (ref 3.5–5.1)
Sodium: 140 mmol/L (ref 135–145)
Total Bilirubin: 0.5 mg/dL (ref 0.3–1.2)
Total Protein: 6.8 g/dL (ref 6.5–8.1)

## 2020-06-28 LAB — RETICULOCYTES
Immature Retic Fract: 28.4 % — ABNORMAL HIGH (ref 2.3–15.9)
RBC.: 2.64 MIL/uL — ABNORMAL LOW (ref 4.22–5.81)
Retic Count, Absolute: 44.9 10*3/uL (ref 19.0–186.0)
Retic Ct Pct: 1.7 % (ref 0.4–3.1)

## 2020-06-28 LAB — CBC WITH DIFFERENTIAL/PLATELET
Abs Immature Granulocytes: 0.01 10*3/uL (ref 0.00–0.07)
Basophils Absolute: 0 10*3/uL (ref 0.0–0.1)
Basophils Relative: 1 %
Eosinophils Absolute: 0 10*3/uL (ref 0.0–0.5)
Eosinophils Relative: 1 %
HCT: 27 % — ABNORMAL LOW (ref 39.0–52.0)
Hemoglobin: 8.9 g/dL — ABNORMAL LOW (ref 13.0–17.0)
Immature Granulocytes: 0 %
Lymphocytes Relative: 47 %
Lymphs Abs: 1.3 10*3/uL (ref 0.7–4.0)
MCH: 33.7 pg (ref 26.0–34.0)
MCHC: 33 g/dL (ref 30.0–36.0)
MCV: 102.3 fL — ABNORMAL HIGH (ref 80.0–100.0)
Monocytes Absolute: 0.5 10*3/uL (ref 0.1–1.0)
Monocytes Relative: 19 %
Neutro Abs: 0.9 10*3/uL — ABNORMAL LOW (ref 1.7–7.7)
Neutrophils Relative %: 32 %
Platelets: 95 10*3/uL — ABNORMAL LOW (ref 150–400)
RBC: 2.64 MIL/uL — ABNORMAL LOW (ref 4.22–5.81)
RDW: 20.5 % — ABNORMAL HIGH (ref 11.5–15.5)
WBC: 2.8 10*3/uL — ABNORMAL LOW (ref 4.0–10.5)
nRBC: 0 % (ref 0.0–0.2)

## 2020-06-29 ENCOUNTER — Inpatient Hospital Stay: Payer: Medicare PPO | Admitting: Hematology

## 2020-06-29 ENCOUNTER — Inpatient Hospital Stay (HOSPITAL_BASED_OUTPATIENT_CLINIC_OR_DEPARTMENT_OTHER): Payer: Medicare PPO | Admitting: Hematology

## 2020-06-29 DIAGNOSIS — D471 Chronic myeloproliferative disease: Secondary | ICD-10-CM | POA: Diagnosis not present

## 2020-06-29 NOTE — Progress Notes (Signed)
HEMATOLOGY/ONCOLOGY CLINIC NOTE  Date of Service: 06/29/2020  Patient Care Team: [name], MD as PCP - General (Family Medicine)  CHIEF COMPLAINTS/PURPOSE OF CONSULTATION:  Concern for a myeloproliferative neoplasm likely polycythemia vera or essential thrombocytosis  HISTORY OF PRESENTING ILLNESS:  [place] is a wonderful 74 y.o. male with medical history significant for migraine headaches benign prostatic hypertrophy and hyperlipidemia who presented to the [facility] in [facility] with large hematoma to left arm.  He was noted to have thrombocytopenia.  Other concerns will be noted.  He has been transferred for further work-up here ED Course: In the emergency room patient had work-up including including which was concerning for possible dural venous sinus thrombosis and MRI with and without contrast and MRV to evaluate was slated to be done at [facility] long. Patient had no significant symptoms of any kind.   INTERVAL HISTORY:  I connected with  [name] on 06/29/20 by telephone and verified that I am speaking with the correct person using two identifiers.   I discussed the limitations of evaluation and management by telemedicine. The patient expressed understanding and agreed to proceed.  Other persons participating in the visit and their role in the encounter:       -Yevette Edwards, Medical Scribe  Patient's location: Home Provider's location: [place] at [place] is a wonderful 74 y.o. male is here for the evaluation and management of polycythemia vera. The patient's last visit with [name] was on 05/04/2020. The pt reports that he is doing well overall.  The pt reports that he has been feeling lightheaded and imbalanced when he bends over or stands up quickly. This has been ongoing for awhile, but has recently worsened. When golfing he has little trouble with the first few holes, but notices these symptoms progressing as the game continues. Pt has been  taking Folic acid and a multivitamin daily.   Lab results (06/28/20) of CBC w/diff and CMP is as follows: all values are WNL except for WBC at 2.8K, RBC at 2.64, Hgb at 8.9, HCT at 27.0, MCV at 102.3, RDW at 20.5, PLT at 95K, Neutro Abs at 0.9K, Creatinine at 1.29, GFR Est Non Af Am at 55. 06/28/2020 Reticulocytes is as follows: Retic Ct Pct at 1.7, RBC at 2.64, Retic Ct Abs at 44.9, Immature Retic Fract at 28.4  On review of systems, pt reports lightheadedness, imbalance, fatigue and denies fevers, chills, night sweats, abnormal/excessive bleeding, headaches and any other symptoms.   MEDICAL HISTORY:  Past Medical History:  Diagnosis Date  . BPH (benign prostatic hyperplasia)   . Colon polyp   . DDD (degenerative disc disease), cervical   . High cholesterol   . Migraine   . Polycythemia vera ([place])     SURGICAL HISTORY: Past Surgical History:  Procedure Laterality Date  . EYE SURGERY      SOCIAL HISTORY: Social History   Socioeconomic History  . Marital status: Married    Spouse name: Not on file  . Number of children: Not on file  . Years of education: Not on file  . Highest education level: Not on file  Occupational History  . Not on file  Tobacco Use  . Smoking status: Never Smoker  . Smokeless tobacco: Never Used  . Tobacco comment: smoked x 1 year at age 31  Vaping Use  . Vaping Use: Never used  Substance and Sexual Activity  . Alcohol use: Yes    Comment: occ  .  Drug use: Never  . Sexual activity: Not on file  Other Topics Concern  . Not on file  Social History Narrative  . Not on file   Social Determinants of Health   Financial Resource Strain:   . Difficulty of Paying Living Expenses:   Food Insecurity:   . Worried About Charity fundraiser in the Last Year:   . Arboriculturist in the Last Year:   Transportation Needs:   . Film/video editor (Medical):   [name] Lack of Transportation (Non-Medical):   Physical Activity:   . Days of Exercise per  Week:   . Minutes of Exercise per Session:   Stress:   . Feeling of Stress :   Social Connections:   . Frequency of Communication with Friends and Family:   . Frequency of Social Gatherings with Friends and Family:   . Attends Religious Services:   . Active Member of Clubs or Organizations:   . Attends Archivist Meetings:   [name] Marital Status:   Intimate Partner Violence:   . Fear of Current or Ex-Partner:   . Emotionally Abused:   [name] Physically Abused:   . Sexually Abused:     FAMILY HISTORY: No family history on file.  ALLERGIES:  has No Known Allergies.  MEDICATIONS:  Current Outpatient Medications  Medication Sig Dispense Refill  . amLODipine (NORVASC) 10 MG tablet Take 1 tablet (10 mg total) by mouth daily. 30 tablet 0  . ELIQUIS 5 MG TABS tablet TAKE 1 TABLET(5 MG) BY MOUTH TWICE DAILY 60 tablet 2  . folic acid (FOLVITE) 1 MG tablet TAKE 1 TABLET(1 MG) BY MOUTH DAILY 90 tablet 0  . hydroxyurea (HYDREA) 500 MG capsule Take 1 cap (500mg ) orally daily for 6 days a week (hold on sundays).May take with food to minimize GI side effects. 60 capsule 2  . Multiple Vitamin (MULTIVITAMIN WITH MINERALS) TABS tablet Take 1 tablet by mouth daily.    . SUMAtriptan (IMITREX) 100 MG tablet Take 100 mg by mouth as needed for migraine or headache.   1   No current facility-administered medications for this visit.    REVIEW OF SYSTEMS:   A 10+ POINT REVIEW OF SYSTEMS WAS OBTAINED including neurology, dermatology, psychiatry, cardiac, respiratory, lymph, extremities, GI, GU, Musculoskeletal, constitutional, breasts, reproductive, HEENT.  All pertinent positives are noted in the HPI.  All others are negative.   PHYSICAL EXAMINATION:  Telehealth visit  LABORATORY DATA:  I have reviewed the data as listed  . CBC Latest Ref Rng & Units 06/28/2020 05/03/2020 03/22/2020  WBC 4.0 - 10.5 K/uL 2.8(L) 4.2 5.3  Hemoglobin 13.0 - 17.0 g/dL 8.9(L) 11.8(L) 11.0(L)  Hematocrit 39 - 52 %  27.0(L) 35.8(L) 35.4(L)  Platelets 150 - 400 K/uL 95(L) 115(L) 455(H)    . CMP Latest Ref Rng & Units 06/28/2020 05/03/2020 03/22/2020  Glucose 70 - 99 mg/dL 81 88 76  BUN 8 - 23 mg/dL 20 18 23   Creatinine 0.61 - 1.24 mg/dL 1.29(H) 1.37(H) 1.41(H)  Sodium 135 - 145 mmol/L 140 140 138  Potassium 3.5 - 5.1 mmol/L 4.3 4.4 3.9  Chloride 98 - 111 mmol/L 108 108 107  CO2 22 - 32 mmol/L 25 25 28   Calcium 8.9 - 10.3 mg/dL 9.6 9.3 9.4  Total Protein 6.5 - 8.1 g/dL 6.8 7.2 7.1  Total Bilirubin 0.3 - 1.2 mg/dL 0.5 0.5 0.7  Alkaline Phos 38 - 126 U/L 74 80 85  AST 15 - 41 U/L  17 21 21   ALT 0 - 44 U/L 7 13 11    01/19/2020 [id number] Flow Pathology (BM Bx):   01/19/2020 [id number] BM Bx Report:    10/24/2019 JAK2 mutation testing:    RADIOGRAPHIC STUDIES: I have personally reviewed the radiological images as listed and agreed with the findings in the report. No results found.  ASSESSMENT & PLAN:   #1Severe thrombocytosis with platelets of 1005k in diagnosis 10/24/2019 JAK2 GenotypR is "POSITIVE for the detection of the V617F mutation" #2 leukocytosis/primary neutrophilia. #3 polycythemia with a hematocrit of 51.7. #4 Dural venous sinus thrombosis -likely triggered by his polycythemia and thrombocytosis. #5 left arm hematoma likely related to a bicipital tendon injury. 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 10/24/2019 CALR Mutation analysis is "Negative"  10/24/2019 BCR ABL is "Negative"  01/19/2020 BM Bx ([id number]) revealed "BONE MARROW, ASPIRATE, CLOT, CORE:  - Normocellular marrow with megakaryocytic hyperplasia and atypia  - Documented JAK-2 (V617F) mutation. PERIPHERAL BLOOD: - Pancytopenia." 01/19/2020 BM Bx Flow  Pathology ([id number]) revealed "No monoclonal B-cell or phenotypically apparent T-cell, or distinct blast population identified."  PLAN:  -Discussed pt labwork, 06/28/20; leukopenia 2.8k , significant anemia hgb 8.9, mild thrombocytopenia plt 95k, blood chemistries are stable -Discussed 06/28/2020 Reticulocytes is as follows: Retic Ct Pct at 1.7, RBC at 2.64, Retic Ct Abs at 44.9, Immature Retic Fract at 28.4 -Advised pt to hold Hydroxyurea for 10 days, then restart 500 mg Hydroxyurea 3x per week. Pt has no prohibitive toxicities from continuing Hydroxyurea at this time. -No indication for a therapeutic phlebotomy at this time as HCT< 45  -Recommend pt begin B-complex vitamin daily  -Continue Eliquis and Folic acid -Will see back in 6 weeks via phone, with labs 1 day prior -Advised pt to contact earlier if change in symptoms  FOLLOW UP:  Phone visit with Dr Irene Limbo in 6 weeks Labs 1 day prior to phone visit   The total time spent in the appt was 20 minutes and more than 50% was on counseling and direct patient cares.  All of the patient's questions were answered with apparent satisfaction. The patient knows to call the clinic with any problems, questions or concerns.    Sullivan Lone MD [place] AAHIVMS [facility] Hematology/Oncology Physician [facility]  (Office):       [phone] (Work cell):  [phone] (Fax):           [phone]  06/29/2020 11:29 AM  I, Yevette Edwards, am acting as a scribe for Dr. Sullivan Lone.   .I have reviewed the above documentation for accuracy and completeness, and I agree with the above. Brunetta Genera MD

## 2020-07-04 ENCOUNTER — Telehealth: Payer: Self-pay | Admitting: Hematology

## 2020-07-04 NOTE — Telephone Encounter (Signed)
Scheduled per los, patient has been called and notified. 

## 2020-07-05 DIAGNOSIS — N400 Enlarged prostate without lower urinary tract symptoms: Secondary | ICD-10-CM | POA: Diagnosis not present

## 2020-07-05 DIAGNOSIS — Z1211 Encounter for screening for malignant neoplasm of colon: Secondary | ICD-10-CM | POA: Diagnosis not present

## 2020-07-05 DIAGNOSIS — Z0000 Encounter for general adult medical examination without abnormal findings: Secondary | ICD-10-CM | POA: Diagnosis not present

## 2020-07-05 DIAGNOSIS — I10 Essential (primary) hypertension: Secondary | ICD-10-CM | POA: Diagnosis not present

## 2020-07-05 DIAGNOSIS — D471 Chronic myeloproliferative disease: Secondary | ICD-10-CM | POA: Diagnosis not present

## 2020-07-05 DIAGNOSIS — Z125 Encounter for screening for malignant neoplasm of prostate: Secondary | ICD-10-CM | POA: Diagnosis not present

## 2020-07-05 DIAGNOSIS — E7800 Pure hypercholesterolemia, unspecified: Secondary | ICD-10-CM | POA: Diagnosis not present

## 2020-07-17 ENCOUNTER — Other Ambulatory Visit: Payer: Self-pay | Admitting: Hematology

## 2020-08-09 ENCOUNTER — Other Ambulatory Visit: Payer: Self-pay

## 2020-08-09 ENCOUNTER — Inpatient Hospital Stay: Payer: Medicare PPO | Attending: Hematology

## 2020-08-09 ENCOUNTER — Telehealth: Payer: Self-pay | Admitting: Hematology

## 2020-08-09 DIAGNOSIS — D473 Essential (hemorrhagic) thrombocythemia: Secondary | ICD-10-CM | POA: Insufficient documentation

## 2020-08-09 DIAGNOSIS — D471 Chronic myeloproliferative disease: Secondary | ICD-10-CM

## 2020-08-09 LAB — CBC WITH DIFFERENTIAL/PLATELET
Abs Immature Granulocytes: 0.01 10*3/uL (ref 0.00–0.07)
Basophils Absolute: 0 10*3/uL (ref 0.0–0.1)
Basophils Relative: 1 %
Eosinophils Absolute: 0.1 10*3/uL (ref 0.0–0.5)
Eosinophils Relative: 2 %
HCT: 26.6 % — ABNORMAL LOW (ref 39.0–52.0)
Hemoglobin: 8.6 g/dL — ABNORMAL LOW (ref 13.0–17.0)
Immature Granulocytes: 0 %
Lymphocytes Relative: 34 %
Lymphs Abs: 1.1 10*3/uL (ref 0.7–4.0)
MCH: 36 pg — ABNORMAL HIGH (ref 26.0–34.0)
MCHC: 32.3 g/dL (ref 30.0–36.0)
MCV: 111.3 fL — ABNORMAL HIGH (ref 80.0–100.0)
Monocytes Absolute: 0.4 10*3/uL (ref 0.1–1.0)
Monocytes Relative: 13 %
Neutro Abs: 1.7 10*3/uL (ref 1.7–7.7)
Neutrophils Relative %: 50 %
Platelets: 109 10*3/uL — ABNORMAL LOW (ref 150–400)
RBC: 2.39 MIL/uL — ABNORMAL LOW (ref 4.22–5.81)
RDW: 19.7 % — ABNORMAL HIGH (ref 11.5–15.5)
WBC: 3.4 10*3/uL — ABNORMAL LOW (ref 4.0–10.5)
nRBC: 0 % (ref 0.0–0.2)

## 2020-08-09 LAB — CMP (CANCER CENTER ONLY)
ALT: 13 U/L (ref 0–44)
AST: 20 U/L (ref 15–41)
Albumin: 4 g/dL (ref 3.5–5.0)
Alkaline Phosphatase: 63 U/L (ref 38–126)
Anion gap: 5 (ref 5–15)
BUN: 19 mg/dL (ref 8–23)
CO2: 28 mmol/L (ref 22–32)
Calcium: 9.2 mg/dL (ref 8.9–10.3)
Chloride: 106 mmol/L (ref 98–111)
Creatinine: 1.27 mg/dL — ABNORMAL HIGH (ref 0.61–1.24)
GFR, Est AFR Am: >60 mL/min (ref >60)
GFR, Estimated: 56 mL/min — ABNORMAL LOW (ref >60)
Glucose, Bld: 87 mg/dL (ref 70–99)
Potassium: 3.8 mmol/L (ref 3.5–5.1)
Sodium: 139 mmol/L (ref 135–145)
Total Bilirubin: 0.8 mg/dL (ref 0.3–1.2)
Total Protein: 7.1 g/dL (ref 6.5–8.1)

## 2020-08-09 LAB — RETICULOCYTES
Immature Retic Fract: 30 % — ABNORMAL HIGH (ref 2.3–15.9)
RBC.: 2.37 MIL/uL — ABNORMAL LOW (ref 4.22–5.81)
Retic Count, Absolute: 60 10*3/uL (ref 19.0–186.0)
Retic Ct Pct: 2.5 % (ref 0.4–3.1)

## 2020-08-09 LAB — VITAMIN B12: Vitamin B-12: 270 pg/mL (ref 180–914)

## 2020-08-09 NOTE — Telephone Encounter (Signed)
Contacted patient to verify phone visit for pre reg °

## 2020-08-09 NOTE — Progress Notes (Signed)
HEMATOLOGY/ONCOLOGY CLINIC NOTE  Date of Service: 08/10/2020  Patient Care Team: [name], MD as PCP - General (Family Medicine)  CHIEF COMPLAINTS/PURPOSE OF CONSULTATION:  Concern for a myeloproliferative neoplasm likely polycythemia vera or essential thrombocytosis  HISTORY OF PRESENTING ILLNESS:  Dillon Hill is a wonderful 74 y.o. male with medical history significant for migraine headaches benign prostatic hypertrophy and hyperlipidemia who presented to the [facility] in [facility] with large hematoma to left arm.  He was noted to have thrombocytopenia.  Other concerns will be noted.  He has been transferred for further work-up here ED Course: In the emergency room patient had work-up including including which was concerning for possible dural venous sinus thrombosis and MRI with and without contrast and MRV to evaluate was slated to be done at [facility] long. Patient had no significant symptoms of any kind.   INTERVAL HISTORY:  I connected with  [name] on 08/10/20 by telephone and verified that I am speaking with the correct person using two identifiers.   I discussed the limitations of evaluation and management by telemedicine. The patient expressed understanding and agreed to proceed.  Other persons participating in the visit and their role in the encounter:        -Yevette Edwards, Medical Scribe  Patient's location: Home Provider's location: [facility] [place] is a wonderful 74 y.o. male is here for the evaluation and management of polycythemia vera. The patient's last visit with [name] was on 06/29/2020. The pt reports that he is doing well overall.  The pt reports that he held Hydroxyurea for one week then restarted at 500 mg daily. Pt has continued taking Folic acid and a B-complex daily. He also started taking Coq10. He reports an increase in energy one week after starting Coq10. Pt denies any headaches and has not needed any Imitrex in the  interim.   Lab results (08/09/20) of CBC w/diff and CMP is as follows: all values are WNL except for WBC at 3.4K, RBC at 2.39, Hgb at 8.6, HCT at 26.6, MCV at 111.3, MCH at 36.0, RDW at 19.7, PLT at 109K, Creatinine at 1.27, GFR Est Non Af Am at 56. 08/09/2020 Vitamin B12 at 270 08/09/2020 Methylmalonic acid is in progress 08/09/2020 Retic Ct Pct at 2.5, Retic Ct Abs at 60.0, Immature Retic Fract at 30.0  On review of systems, pt reports improved lightheadedness/dizziness and denies bloody stools, melena, hematuria, headaches, fatigue and any other symptoms.   MEDICAL HISTORY:  Past Medical History:  Diagnosis Date  . BPH (benign prostatic hyperplasia)   . Colon polyp   . DDD (degenerative disc disease), cervical   . High cholesterol   . Migraine   . Polycythemia vera ([place])     SURGICAL HISTORY: Past Surgical History:  Procedure Laterality Date  . EYE SURGERY      SOCIAL HISTORY: Social History   Socioeconomic History  . Marital status: Married    Spouse name: Not on file  . Number of children: Not on file  . Years of education: Not on file  . Highest education level: Not on file  Occupational History  . Not on file  Tobacco Use  . Smoking status: Never Smoker  . Smokeless tobacco: Never Used  . Tobacco comment: smoked x 1 year at age 42  Vaping Use  . Vaping Use: Never used  Substance and Sexual Activity  . Alcohol use: Yes    Comment: occ  . Drug use:  Never  . Sexual activity: Not on file  Other Topics Concern  . Not on file  Social History Narrative  . Not on file   Social Determinants of Health   Financial Resource Strain:   . Difficulty of Paying Living Expenses: Not on file  Food Insecurity:   . Worried About Charity fundraiser in the Last Year: Not on file  . Ran Out of Food in the Last Year: Not on file  Transportation Needs:   . Lack of Transportation (Medical): Not on file  . Lack of Transportation (Non-Medical): Not on file  Physical  Activity:   . Days of Exercise per Week: Not on file  . Minutes of Exercise per Session: Not on file  Stress:   . Feeling of Stress : Not on file  Social Connections:   . Frequency of Communication with Friends and Family: Not on file  . Frequency of Social Gatherings with Friends and Family: Not on file  . Attends Religious Services: Not on file  . Active Member of Clubs or Organizations: Not on file  . Attends Archivist Meetings: Not on file  . Marital Status: Not on file  Intimate Partner Violence:   . Fear of Current or Ex-Partner: Not on file  . Emotionally Abused: Not on file  . Physically Abused: Not on file  . Sexually Abused: Not on file    FAMILY HISTORY: No family history on file.  ALLERGIES:  has No Known Allergies.  MEDICATIONS:  Current Outpatient Medications  Medication Sig Dispense Refill  . amLODipine (NORVASC) 10 MG tablet Take 1 tablet (10 mg total) by mouth daily. 30 tablet 0  . Cyanocobalamin (B-12) 1000 MCG SUBL Place 1,000 mcg under the tongue daily. 30 tablet 5  . ELIQUIS 5 MG TABS tablet TAKE 1 TABLET(5 MG) BY MOUTH TWICE DAILY 60 tablet 2  . folic acid (FOLVITE) 1 MG tablet TAKE 1 TABLET(1 MG) BY MOUTH DAILY 90 tablet 0  . hydroxyurea (HYDREA) 500 MG capsule Take 1 cap (500mg ) orally twice weekly on Monday and friday. May take with food to minimize GI side effects. 60 capsule 2  . Multiple Vitamin (MULTIVITAMIN WITH MINERALS) TABS tablet Take 1 tablet by mouth daily.    . SUMAtriptan (IMITREX) 100 MG tablet Take 100 mg by mouth as needed for migraine or headache.   1   No current facility-administered medications for this visit.    REVIEW OF SYSTEMS:   A 10+ POINT REVIEW OF SYSTEMS WAS OBTAINED including neurology, dermatology, psychiatry, cardiac, respiratory, lymph, extremities, GI, GU, Musculoskeletal, constitutional, breasts, reproductive, HEENT.  All pertinent positives are noted in the HPI.  All others are negative.   PHYSICAL  EXAMINATION:  Telehealth visit  LABORATORY DATA:  I have reviewed the data as listed  . CBC Latest Ref Rng & Units 08/09/2020 06/28/2020 05/03/2020  WBC 4.0 - 10.5 K/uL 3.4(L) 2.8(L) 4.2  Hemoglobin 13.0 - 17.0 g/dL 8.6(L) 8.9(L) 11.8(L)  Hematocrit 39 - 52 % 26.6(L) 27.0(L) 35.8(L)  Platelets 150 - 400 K/uL 109(L) 95(L) 115(L)    . CMP Latest Ref Rng & Units 08/09/2020 06/28/2020 05/03/2020  Glucose 70 - 99 mg/dL 87 81 88  BUN 8 - 23 mg/dL 19 20 18   Creatinine 0.61 - 1.24 mg/dL 1.27(H) 1.29(H) 1.37(H)  Sodium 135 - 145 mmol/L 139 140 140  Potassium 3.5 - 5.1 mmol/L 3.8 4.3 4.4  Chloride 98 - 111 mmol/L 106 108 108  CO2 22 - 32  mmol/L 28 25 25   Calcium 8.9 - 10.3 mg/dL 9.2 9.6 9.3  Total Protein 6.5 - 8.1 g/dL 7.1 6.8 7.2  Total Bilirubin 0.3 - 1.2 mg/dL 0.8 0.5 0.5  Alkaline Phos 38 - 126 U/L 63 74 80  AST 15 - 41 U/L 20 17 21   ALT 0 - 44 U/L 13 7 13    01/19/2020 [id number] Flow Pathology (BM Bx):   01/19/2020 [id number] BM Bx Report:    10/24/2019 JAK2 mutation testing:    RADIOGRAPHIC STUDIES: I have personally reviewed the radiological images as listed and agreed with the findings in the report. No results found.  ASSESSMENT & PLAN:   #1Severe thrombocytosis with platelets of 1005k in diagnosis 10/24/2019 JAK2 GenotypR is "POSITIVE for the detection of the V617F mutation" #2 leukocytosis/primary neutrophilia. #3 polycythemia with a hematocrit of 51.7. #4 Dural venous sinus thrombosis -likely triggered by his polycythemia and thrombocytosis. #5 left arm hematoma likely related to a bicipital tendon injury. 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 10/24/2019 CALR Mutation analysis is "Negative"  10/24/2019 BCR  ABL is "Negative"  01/19/2020 BM Bx ([id number]) revealed "BONE MARROW, ASPIRATE, CLOT, CORE:  - Normocellular marrow with megakaryocytic hyperplasia and atypia  - Documented JAK-2 (V617F) mutation. PERIPHERAL BLOOD: - Pancytopenia." 01/19/2020 BM Bx Flow Pathology ([id number]) revealed "No monoclonal B-cell or phenotypically apparent T-cell, or distinct blast population identified."  PLAN:  -Discussed pt labwork today, 08/09/20; stable anemia, thrombocytopenia, and leukopenia; blood chemistries are steady, B12 is low.  -Recommend hold Hydroxyurea for ten days. Restart 500 mg Hydroxyurea twice per week (M&F). No prohibitive toxicities at this time.  -No indication for a therapeutic phlebotomy at this time as HCT< 45. -Recommend pt begin 1000 mcg SL Vitamin B12 daily  -Continue B-complex and Folic acid -Continue Eliquis -Rx Cyanocobalamin -Will get labs 1-2 days prior to phone visit -Will see back in 6 weeks via phone   FOLLOW UP:  Phone visit with Dr Irene Limbo in 6 weeks Plz schedule labs 1-2 days prior to phone visit   The total time spent in the appt was 20 minutes and more than 50% was on counseling and direct patient cares.  All of the patient's questions were answered with apparent satisfaction. The patient knows to call the clinic with any problems, questions or concerns.    Sullivan Lone MD [place] AAHIVMS [facility] Hematology/Oncology Physician [facility]  (Office):       [phone] (Work cell):  [phone] (Fax):           [phone]  08/10/2020 11:00 AM  I, Yevette Edwards, am acting as a scribe for Dr. Sullivan Lone.   .I have reviewed the above documentation for accuracy and completeness, and I agree with the above. Brunetta Genera MD

## 2020-08-10 ENCOUNTER — Other Ambulatory Visit: Payer: Self-pay | Admitting: Hematology

## 2020-08-10 ENCOUNTER — Inpatient Hospital Stay (HOSPITAL_BASED_OUTPATIENT_CLINIC_OR_DEPARTMENT_OTHER): Payer: Medicare PPO | Admitting: Hematology

## 2020-08-10 DIAGNOSIS — D45 Polycythemia vera: Secondary | ICD-10-CM

## 2020-08-10 DIAGNOSIS — D471 Chronic myeloproliferative disease: Secondary | ICD-10-CM

## 2020-08-10 DIAGNOSIS — D473 Essential (hemorrhagic) thrombocythemia: Secondary | ICD-10-CM

## 2020-08-10 DIAGNOSIS — D696 Thrombocytopenia, unspecified: Secondary | ICD-10-CM

## 2020-08-10 DIAGNOSIS — D75839 Thrombocytosis, unspecified: Secondary | ICD-10-CM

## 2020-08-10 MED ORDER — B-12 1000 MCG SL SUBL: 1000.0000 ug | SUBLINGUAL_TABLET | Freq: Every day | SUBLINGUAL | 5 refills | Status: AC

## 2020-08-10 MED ORDER — HYDROXYUREA 500 MG PO CAPS: ORAL_CAPSULE | ORAL | 2 refills | Status: DC

## 2020-08-12 IMAGING — MR MR HEAD WO/W CM
6 of 13 series · 28 of 48 positions shown · IV contrast (gadavist)
Comparison: CT venogram yesterday.
COMPARISON: CT venogram yesterday.

Addendum:
CLINICAL DATA: 73-year-old male with suspected dural sinus
thrombosis. Severe polycythemia, thrombocytosis, persistent
headache. Suspicious CTV.

EXAM:
MRI HEAD WITHOUT AND WITH CONTRAST
MR VENOGRAM HEAD WITHOUT AND WITH CONTRAST
TECHNIQUE: Multiplanar, multiecho pulse sequences of the brain and surrounding
structures were obtained without and with intravenous contrast.
Angiographic images of the intracranial venous structures were
obtained using MRV technique without and with intravenous contrast.
CONTRAST:  7.5mL GADAVIST GADOBUTROL 1 MMOL/ML IV SOLN

[Series 4: T1 post-contrast · axial · 1.0mm · 0.47mm/px · z∈[-9,+121]mm · 8 of 132 slices shown]
[im 1/132]
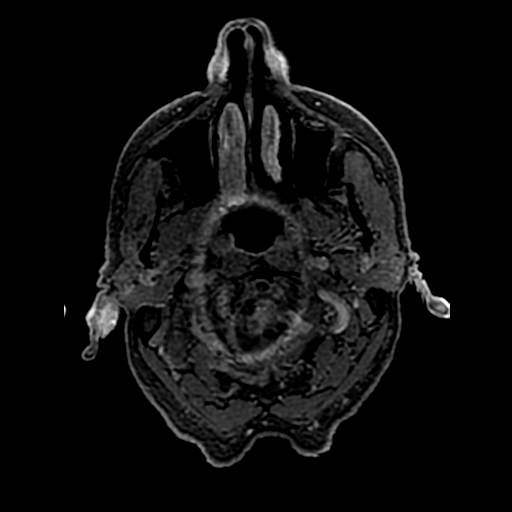
[im 17/132]
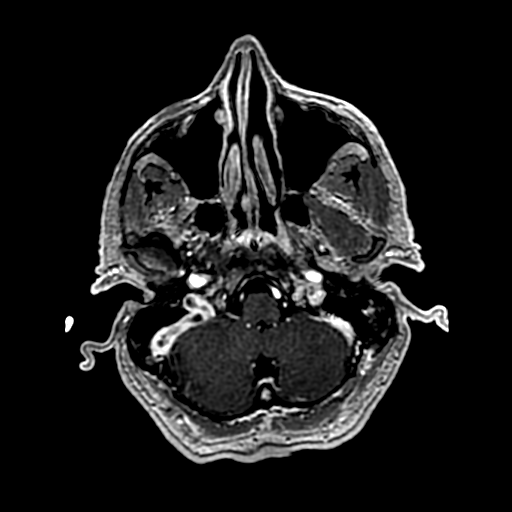
[im 33/132]
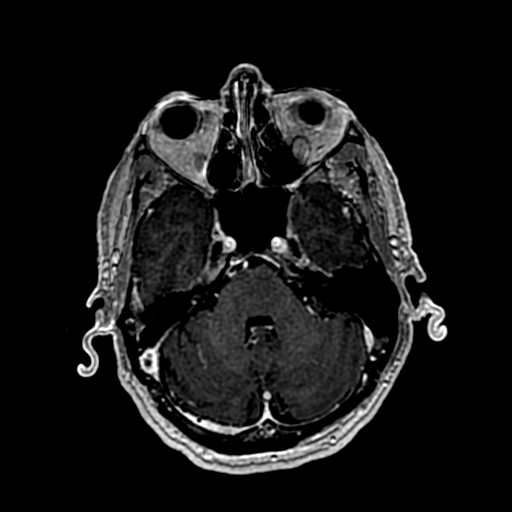
[im 50/132]
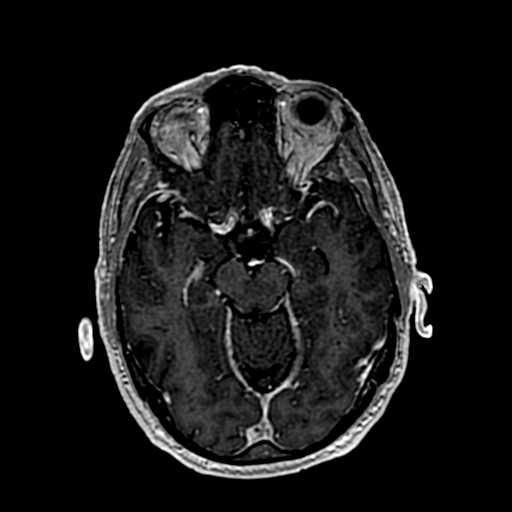
[im 82/132]
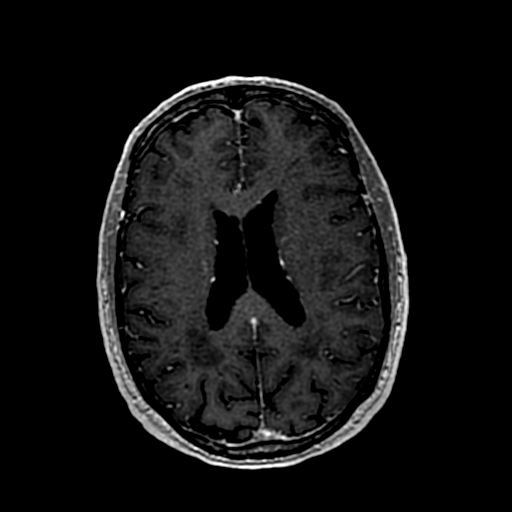
[im 99/132]
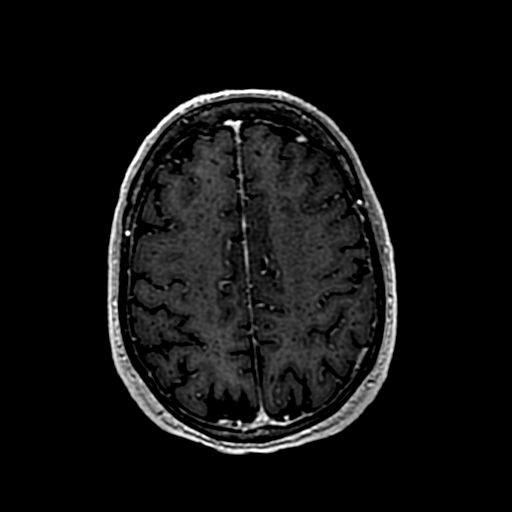
[im 115/132]
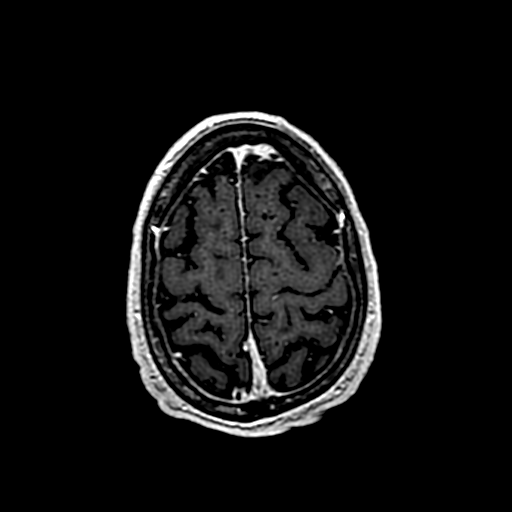
[im 132/132]
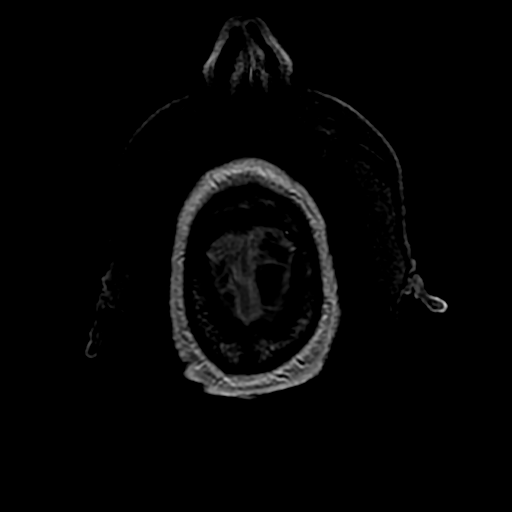

[Series 4: DWI · axial · 3.0mm · 1.09mm/px · z∈[-30,+108]mm · 6 of 94 slices shown (1 of 4)]
[im 1/94]
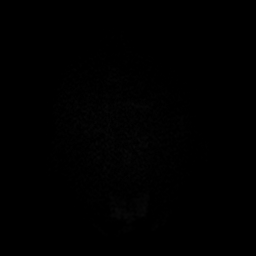
[im 19/94]
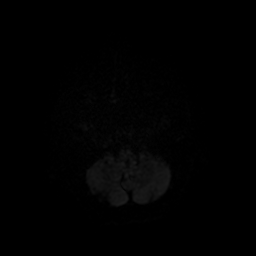
[im 38/94]
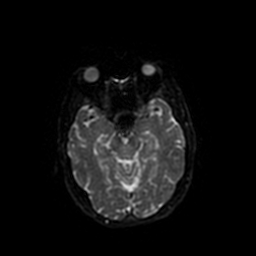
[im 56/94]
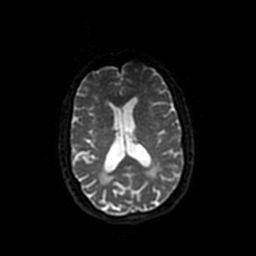
[im 75/94]
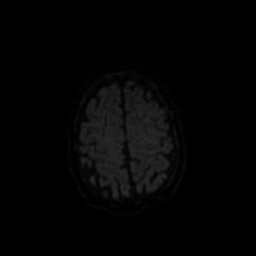
[im 94/94]
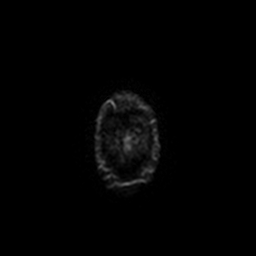

[Series 6: FLAIR · axial · 3.0mm · 0.86mm/px · z∈[-31,+112]mm · 2 of 25 slices shown]
[im 1/25]
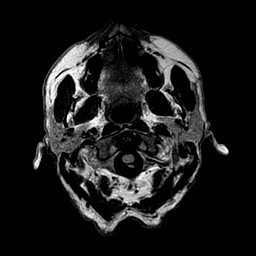
[im 25/25]
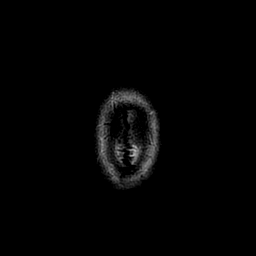

[Series 9: DWI · coronal · 4.0mm · 1.09mm/px · 6 of 86 slices shown (2 of 4)]
[im 1/86]
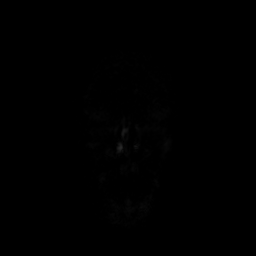
[im 18/86]
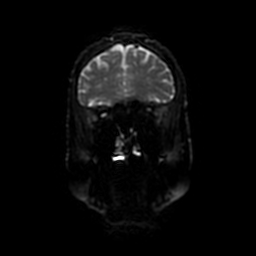
[im 35/86]
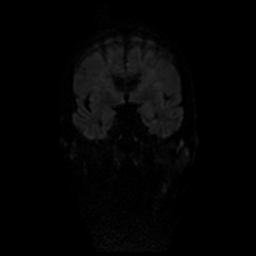
[im 52/86]
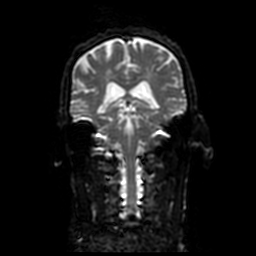
[im 69/86]
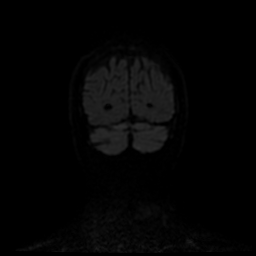
[im 86/86]
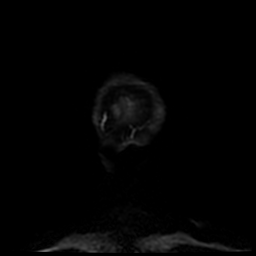

[Series 400: DWI · axial · 3.0mm · 1.09mm/px · z∈[-30,+108]mm · 3 of 42 slices shown (3 of 4)]
[im 1/42]
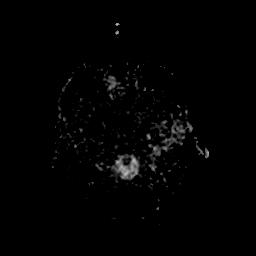
[im 21/42]
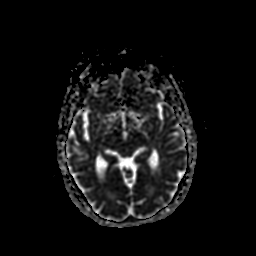
[im 42/42]
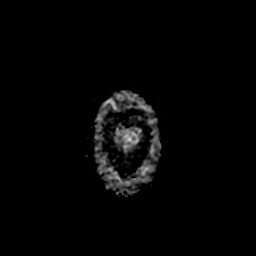

[Series 900: DWI · coronal · 4.0mm · 1.09mm/px · 3 of 43 slices shown (4 of 4)]
[im 1/43]
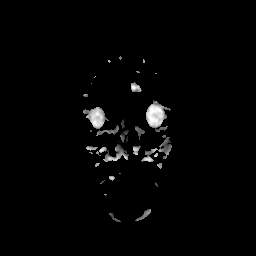
[im 22/43]
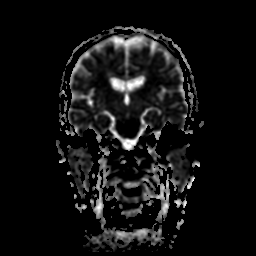
[im 43/43]
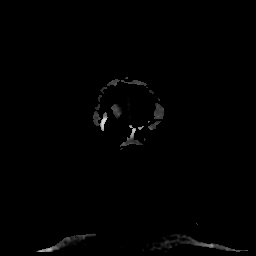

[28 of 48 positions shown; findings below may reference images not displayed]

FINDINGS: MRI HEAD:

Brain: No restricted diffusion or evidence of acute infarction.

No midline shift, mass effect, evidence of mass lesion,
ventriculomegaly, extra-axial collection or acute intracranial
hemorrhage. Cervicomedullary junction and pituitary are within
normal limits.

Patchy and mild to moderate for age scattered cerebral white matter
T2 and FLAIR hyperintensity in a nonspecific configuration. No
cortical edema or encephalomalacia identified. No chronic cerebral
blood products. Deep gray nuclei, brainstem and cerebellum appear
negative.

No abnormal hyperenhancement, no abnormal dural thickening
identified.

Vascular: Abnormal loss of the portions of the superior sagittal
sinus, right sigmoid sinus and IJ bulb flow voids, with associated
abnormal venous enhancement in the region which is further detailed
below.

Major arterial vascular flow voids at the skull base appear
preserved. The left vertebral artery appears dominant.

Skull and upper cervical spine: Negative visible cervical spine.
Heterogeneous bone marrow signal throughout the skull and visible
spine although no destructive osseous lesion is identified. There is
associated abnormal diffusion signal in the visible skeleton.

Sinuses/Orbits: Postoperative changes to both globes. Otherwise
negative orbits. Paranasal sinuses are clear.

Other: Mild left mastoid effusion appears inconsequential. Visible
internal auditory structures appear normal.

MRV HEAD:

2D and 3D time-of-flight images demonstrate areas of lost signal
abnormality in the posterosuperior sagittal sinus corresponding to
heterogeneous enhancement of the sinus. There is absent flow signal
in the right transverse sinus, sigmoid sinus and IJ bulb.

There is preserved flow signal in the torcula, straight sinus, vein
of Lio, internal cerebral veins, basal veins of Hebda as well
as non dominant appearing left transverse sinus, left sigmoid sinus,
and left IJ bulb.

Post-contrast images corroborate areas of posterosuperior sagittal
sinus thrombosis (beginning at the vertex series 4, image 120) and
continuing through the torcula (image 54). Intermittent thrombosis
of the right transverse sinus. More pronounced thrombosis of the
right sigmoid sinus continuing to the right IJ bulb (image 16). And
also thrombosis of the right paravertebral emissary vein on series
4, image 10.
IMPRESSION: 1. Positive for dural venous sinus thrombosis occurring
intermittently from the posterosuperior sagittal sinus through the
dominant right transverse and sigmoid sinuses, also involving the
right IJ bulb and another emissary vein at the right skull base.
2. Negative for associated acute infarct or cerebral edema.
3. Abnormal bone marrow signal suspicious for an infiltrative marrow
process in this clinical setting. No destructive osseous lesion
identified.
4. Mild to moderate for age nonspecific cerebral white matter signal
changes, most commonly due to chronic small vessel disease.

ADDENDUM:
Study discussed by telephone with Dr. Adithya on 10/24/2019 at 3333
hours

*** End of Addendum ***
FINDINGS: MRI HEAD:

Brain: No restricted diffusion or evidence of acute infarction.

No midline shift, mass effect, evidence of mass lesion,
ventriculomegaly, extra-axial collection or acute intracranial
hemorrhage. Cervicomedullary junction and pituitary are within
normal limits.

Patchy and mild to moderate for age scattered cerebral white matter
T2 and FLAIR hyperintensity in a nonspecific configuration. No
cortical edema or encephalomalacia identified. No chronic cerebral
blood products. Deep gray nuclei, brainstem and cerebellum appear
negative.

No abnormal hyperenhancement, no abnormal dural thickening
identified.

Vascular: Abnormal loss of the portions of the superior sagittal
sinus, right sigmoid sinus and IJ bulb flow voids, with associated
abnormal venous enhancement in the region which is further detailed
below.

Major arterial vascular flow voids at the skull base appear
preserved. The left vertebral artery appears dominant.

Skull and upper cervical spine: Negative visible cervical spine.
Heterogeneous bone marrow signal throughout the skull and visible
spine although no destructive osseous lesion is identified. There is
associated abnormal diffusion signal in the visible skeleton.

Sinuses/Orbits: Postoperative changes to both globes. Otherwise
negative orbits. Paranasal sinuses are clear.

Other: Mild left mastoid effusion appears inconsequential. Visible
internal auditory structures appear normal.

MRV HEAD:

2D and 3D time-of-flight images demonstrate areas of lost signal
abnormality in the posterosuperior sagittal sinus corresponding to
heterogeneous enhancement of the sinus. There is absent flow signal
in the right transverse sinus, sigmoid sinus and IJ bulb.

There is preserved flow signal in the torcula, straight sinus, vein
of Lio, internal cerebral veins, basal veins of Hebda as well
as non dominant appearing left transverse sinus, left sigmoid sinus,
and left IJ bulb.

Post-contrast images corroborate areas of posterosuperior sagittal
sinus thrombosis (beginning at the vertex series 4, image 120) and
continuing through the torcula (image 54). Intermittent thrombosis
of the right transverse sinus. More pronounced thrombosis of the
right sigmoid sinus continuing to the right IJ bulb (image 16). And
also thrombosis of the right paravertebral emissary vein on series
4, image 10.
IMPRESSION: 1. Positive for dural venous sinus thrombosis occurring
intermittently from the posterosuperior sagittal sinus through the
dominant right transverse and sigmoid sinuses, also involving the
right IJ bulb and another emissary vein at the right skull base.
2. Negative for associated acute infarct or cerebral edema.
3. Abnormal bone marrow signal suspicious for an infiltrative marrow
process in this clinical setting. No destructive osseous lesion
identified.
4. Mild to moderate for age nonspecific cerebral white matter signal
changes, most commonly due to chronic small vessel disease.

## 2020-08-16 LAB — METHYLMALONIC ACID, SERUM: Methylmalonic Acid, Quantitative: 292 nmol/L (ref 0–378)

## 2020-09-09 ENCOUNTER — Telehealth: Payer: Self-pay | Admitting: Hematology

## 2020-09-09 NOTE — Telephone Encounter (Signed)
Scheduled per 9/22 los, patient has been called and voicemail was left.

## 2020-09-19 ENCOUNTER — Inpatient Hospital Stay: Payer: Medicare PPO | Attending: Hematology

## 2020-09-19 ENCOUNTER — Other Ambulatory Visit: Payer: Self-pay

## 2020-09-19 DIAGNOSIS — D75839 Thrombocytosis, unspecified: Secondary | ICD-10-CM | POA: Diagnosis not present

## 2020-09-19 LAB — RETICULOCYTES
Immature Retic Fract: 21.5 % — ABNORMAL HIGH (ref 2.3–15.9)
RBC.: 2.63 MIL/uL — ABNORMAL LOW (ref 4.22–5.81)
Retic Count, Absolute: 48.4 10*3/uL (ref 19.0–186.0)
Retic Ct Pct: 1.8 % (ref 0.4–3.1)

## 2020-09-19 LAB — CMP (CANCER CENTER ONLY)
ALT: 10 U/L (ref 0–44)
AST: 20 U/L (ref 15–41)
Albumin: 3.8 g/dL (ref 3.5–5.0)
Alkaline Phosphatase: 63 U/L (ref 38–126)
Anion gap: 6 (ref 5–15)
BUN: 22 mg/dL (ref 8–23)
CO2: 27 mmol/L (ref 22–32)
Calcium: 9.4 mg/dL (ref 8.9–10.3)
Chloride: 108 mmol/L (ref 98–111)
Creatinine: 1.33 mg/dL — ABNORMAL HIGH (ref 0.61–1.24)
GFR, Estimated: 56 mL/min — ABNORMAL LOW (ref >60)
Glucose, Bld: 69 mg/dL — ABNORMAL LOW (ref 70–99)
Potassium: 3.9 mmol/L (ref 3.5–5.1)
Sodium: 141 mmol/L (ref 135–145)
Total Bilirubin: 0.6 mg/dL (ref 0.3–1.2)
Total Protein: 6.9 g/dL (ref 6.5–8.1)

## 2020-09-19 LAB — CBC WITH DIFFERENTIAL/PLATELET
Abs Immature Granulocytes: 0.01 10*3/uL (ref 0.00–0.07)
Basophils Absolute: 0 10*3/uL (ref 0.0–0.1)
Basophils Relative: 0 %
Eosinophils Absolute: 0.1 10*3/uL (ref 0.0–0.5)
Eosinophils Relative: 3 %
HCT: 28.6 % — ABNORMAL LOW (ref 39.0–52.0)
Hemoglobin: 9.3 g/dL — ABNORMAL LOW (ref 13.0–17.0)
Immature Granulocytes: 0 %
Lymphocytes Relative: 55 %
Lymphs Abs: 1.3 10*3/uL (ref 0.7–4.0)
MCH: 35.1 pg — ABNORMAL HIGH (ref 26.0–34.0)
MCHC: 32.5 g/dL (ref 30.0–36.0)
MCV: 107.9 fL — ABNORMAL HIGH (ref 80.0–100.0)
Monocytes Absolute: 0.4 10*3/uL (ref 0.1–1.0)
Monocytes Relative: 17 %
Neutro Abs: 0.6 10*3/uL — ABNORMAL LOW (ref 1.7–7.7)
Neutrophils Relative %: 25 %
Platelets: 145 10*3/uL — ABNORMAL LOW (ref 150–400)
RBC: 2.65 MIL/uL — ABNORMAL LOW (ref 4.22–5.81)
RDW: 16.9 % — ABNORMAL HIGH (ref 11.5–15.5)
WBC: 2.3 10*3/uL — ABNORMAL LOW (ref 4.0–10.5)
nRBC: 0 % (ref 0.0–0.2)

## 2020-09-19 LAB — IMMATURE PLATELET FRACTION: Immature Platelet Fraction: 4.1 % (ref 1.2–8.6)

## 2020-09-20 ENCOUNTER — Telehealth: Payer: Self-pay | Admitting: Hematology

## 2020-09-20 NOTE — Telephone Encounter (Signed)
Contacted patient to verify telephone visit for pre reg °

## 2020-09-21 ENCOUNTER — Inpatient Hospital Stay (HOSPITAL_BASED_OUTPATIENT_CLINIC_OR_DEPARTMENT_OTHER): Payer: Medicare PPO | Admitting: Hematology

## 2020-09-21 DIAGNOSIS — D45 Polycythemia vera: Secondary | ICD-10-CM | POA: Diagnosis not present

## 2020-09-21 DIAGNOSIS — D75839 Thrombocytosis, unspecified: Secondary | ICD-10-CM | POA: Diagnosis not present

## 2020-09-21 MED ORDER — HYDROXYUREA 500 MG PO CAPS: ORAL_CAPSULE | ORAL | 2 refills | Status: DC

## 2020-09-21 NOTE — Progress Notes (Signed)
HEMATOLOGY/ONCOLOGY CLINIC NOTE  Date of Service: 09/21/2020  Patient Care Team: [name], MD as PCP - General (Family Medicine)  CHIEF COMPLAINTS/PURPOSE OF CONSULTATION:  Concern for a myeloproliferative neoplasm likely polycythemia vera or essential thrombocytosis  HISTORY OF PRESENTING ILLNESS:  Dillon Hill is a wonderful 74 y.o. male with medical history significant for migraine headaches benign prostatic hypertrophy and hyperlipidemia who presented to the [facility] in [facility] with large hematoma to left arm.  He was noted to have thrombocytopenia.  Other concerns will be noted.  He has been transferred for further work-up here ED Course: In the emergency room patient had work-up including including which was concerning for possible dural venous sinus thrombosis and MRI with and without contrast and MRV to evaluate was slated to be done at [facility] long. Patient had no significant symptoms of any kind.   INTERVAL HISTORY:  I connected with  Dillon Hill on 09/21/20 by telephone and verified that I am speaking with the correct person using two identifiers.   I discussed the limitations of evaluation and management by telemedicine. The patient expressed understanding and agreed to proceed.  Other persons participating in the visit and their role in the encounter:       -Yevette Edwards, Medical Scribe  Patient's location: Home  Provider's location: [place] at [place] is a wonderful 74 y.o. male is here for the evaluation and management of polycythemia vera. The patient's last visit with [name] was on 08/10/2020. The pt reports that he is doing well overall.  The pt reports that he has noticed an increase in energy since lowering his Hydroxyurea dosage. He denies any recent infections. Pt has continued B12, B-complex, and Folic acid daily. Pt has continued Eliquis as prescribed and notes excessive bruising.   Lab results (09/19/20) of CBC w/diff and CMP is  as follows: all values are WNL except for WBC at 2.3K, RBC at 2.65, Hgb at 9.3, HCT at 28.6, MCV at 107.9, MCH at 35.1, RDW at 16.9, PLT at 145K, Neutro Abs at 0.6K, Glucose at 69, Creatinine at 1.33, GFR Est at 56. 09/19/2020 Retic Ct Pct at 1.8, Retic Ct Abs at 48.4, Immature Retic Fract at 21.5 09/19/2020 Immature PLT Fract at 4.1  On review of systems, pt reports improving fatigue, improving imbalance, bruising and denies any other symptoms.   MEDICAL HISTORY:  Past Medical History:  Diagnosis Date  . BPH (benign prostatic hyperplasia)   . Colon polyp   . DDD (degenerative disc disease), cervical   . High cholesterol   . Migraine   . Polycythemia vera ([place])     SURGICAL HISTORY: Past Surgical History:  Procedure Laterality Date  . EYE SURGERY      SOCIAL HISTORY: Social History   Socioeconomic History  . Marital status: Married    Spouse name: Not on file  . Number of children: Not on file  . Years of education: Not on file  . Highest education level: Not on file  Occupational History  . Not on file  Tobacco Use  . Smoking status: Never Smoker  . Smokeless tobacco: Never Used  . Tobacco comment: smoked x 1 year at age 46  Vaping Use  . Vaping Use: Never used  Substance and Sexual Activity  . Alcohol use: Yes    Comment: occ  . Drug use: Never  . Sexual activity: Not on file  Other Topics Concern  . Not on file  Social History  Narrative  . Not on file   Social Determinants of Health   Financial Resource Strain:   . Difficulty of Paying Living Expenses: Not on file  Food Insecurity:   . Worried About Charity fundraiser in the Last Year: Not on file  . Ran Out of Food in the Last Year: Not on file  Transportation Needs:   . Lack of Transportation (Medical): Not on file  . Lack of Transportation (Non-Medical): Not on file  Physical Activity:   . Days of Exercise per Week: Not on file  . Minutes of Exercise per Session: Not on file  Stress:   . Feeling  of Stress : Not on file  Social Connections:   . Frequency of Communication with Friends and Family: Not on file  . Frequency of Social Gatherings with Friends and Family: Not on file  . Attends Religious Services: Not on file  . Active Member of Clubs or Organizations: Not on file  . Attends Archivist Meetings: Not on file  . Marital Status: Not on file  Intimate Partner Violence:   . Fear of Current or Ex-Partner: Not on file  . Emotionally Abused: Not on file  . Physically Abused: Not on file  . Sexually Abused: Not on file    FAMILY HISTORY: No family history on file.  ALLERGIES:  has No Known Allergies.  MEDICATIONS:  Current Outpatient Medications  Medication Sig Dispense Refill  . amLODipine (NORVASC) 10 MG tablet Take 1 tablet (10 mg total) by mouth daily. 30 tablet 0  . Cyanocobalamin (B-12) 1000 MCG SUBL Place 1,000 mcg under the tongue daily. 30 tablet 5  . ELIQUIS 5 MG TABS tablet TAKE 1 TABLET(5 MG) BY MOUTH TWICE DAILY 60 tablet 2  . folic acid (FOLVITE) 1 MG tablet TAKE 1 TABLET(1 MG) BY MOUTH DAILY 90 tablet 0  . hydroxyurea (HYDREA) 500 MG capsule Take 1 cap (500mg ) orally once weekly on Monday. May take with food to minimize GI side effects. 60 capsule 2  . Multiple Vitamin (MULTIVITAMIN WITH MINERALS) TABS tablet Take 1 tablet by mouth daily.    . SUMAtriptan (IMITREX) 100 MG tablet Take 100 mg by mouth as needed for migraine or headache.   1   No current facility-administered medications for this visit.    REVIEW OF SYSTEMS:   A 10+ POINT REVIEW OF SYSTEMS WAS OBTAINED including neurology, dermatology, psychiatry, cardiac, respiratory, lymph, extremities, GI, GU, Musculoskeletal, constitutional, breasts, reproductive, HEENT.  All pertinent positives are noted in the HPI.  All others are negative.   PHYSICAL EXAMINATION:  Telehealth visit 09/21/2020  LABORATORY DATA:  I have reviewed the data as listed  . CBC Latest Ref Rng & Units 09/19/2020  08/09/2020 06/28/2020  WBC 4.0 - 10.5 K/uL 2.3(L) 3.4(L) 2.8(L)  Hemoglobin 13.0 - 17.0 g/dL 9.3(L) 8.6(L) 8.9(L)  Hematocrit 39 - 52 % 28.6(L) 26.6(L) 27.0(L)  Platelets 150 - 400 K/uL 145(L) 109(L) 95(L)    . CMP Latest Ref Rng & Units 09/19/2020 08/09/2020 06/28/2020  Glucose 70 - 99 mg/dL 69(L) 87 81  BUN 8 - 23 mg/dL 22 19 20   Creatinine 0.61 - 1.24 mg/dL 1.33(H) 1.27(H) 1.29(H)  Sodium 135 - 145 mmol/L 141 139 140  Potassium 3.5 - 5.1 mmol/L 3.9 3.8 4.3  Chloride 98 - 111 mmol/L 108 106 108  CO2 22 - 32 mmol/L 27 28 25   Calcium 8.9 - 10.3 mg/dL 9.4 9.2 9.6  Total Protein 6.5 - 8.1 g/dL 6.9  7.1 6.8  Total Bilirubin 0.3 - 1.2 mg/dL 0.6 0.8 0.5  Alkaline Phos 38 - 126 U/L 63 63 74  AST 15 - 41 U/L 20 20 17   ALT 0 - 44 U/L 10 13 7    01/19/2020 [id number] Flow Pathology (BM Bx):   01/19/2020 [id number] BM Bx Report:    10/24/2019 JAK2 mutation testing:    RADIOGRAPHIC STUDIES: I have personally reviewed the radiological images as listed and agreed with the findings in the report. No results found.  ASSESSMENT & PLAN:   #1Severe thrombocytosis with platelets of 1005k in diagnosis 10/24/2019 JAK2 GenotypR is "POSITIVE for the detection of the V617F mutation" #2 leukocytosis/primary neutrophilia. #3 polycythemia with a hematocrit of 51.7. #4 Dural venous sinus thrombosis -likely triggered by his polycythemia and thrombocytosis. #5 left arm hematoma likely related to a bicipital tendon injury. 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 10/24/2019 CALR Mutation analysis is "Negative"  10/24/2019 BCR ABL is "Negative"  01/19/2020 BM Bx ([id number]) revealed "BONE MARROW, ASPIRATE, CLOT, CORE:  - Normocellular marrow with  megakaryocytic hyperplasia and atypia  - Documented JAK-2 (V617F) mutation. PERIPHERAL BLOOD: - Pancytopenia." 01/19/2020 BM Bx Flow Pathology ([id number]) revealed "No monoclonal B-cell or phenotypically apparent T-cell, or distinct blast population identified."  PLAN:  -Discussed pt labwork, 09/19/20; PLT & Hgb have improved.  -Discussed 09/19/2020 Retic Ct Pct at 1.8, Retic Ct Abs at 48.4, Immature Retic Fract at 21.5, Immature PLT Fract at 4.1.  -Advised pt to hold Hydroxyurea for three weeks, then restart 500 mg once per week on Mondays - No prohibitive toxicities at this time. -No indication for a therapeutic phlebotomy at this time as HCT< 45. -Recommend pt avoid social gatherings due to especially low neutrophils.  -Continue daily Vitamin H74, Folic acid, & B-complex  -Continue 5 mg Eliquis BID -Will see back in 6 weeks via phone -Will get labs 1-2 days prior to next visit   FOLLOW UP:  Phone visit with Dr Irene Limbo in 6 weeks Plz schedule labs 1-2 days prior to phone visit   The total time spent in the appt was 20 minutes and more than 50% was on counseling and direct patient cares.  All of the patient's questions were answered with apparent satisfaction. The patient knows to call the clinic with any problems, questions or concerns.    Sullivan Lone MD [place] AAHIVMS [facility] Hematology/Oncology Physician [facility] [place]  (Office):       [phone] (Work cell):  [phone] (Fax):           [phone]  09/21/2020 2:48 PM  I, Yevette Edwards, am acting as a scribe for Dr. Sullivan Lone.   .I have reviewed the above documentation for accuracy and completeness, and I agree with the above. Brunetta Genera MD

## 2020-09-25 DIAGNOSIS — Z23 Encounter for immunization: Secondary | ICD-10-CM | POA: Diagnosis not present

## 2020-10-10 DIAGNOSIS — R2231 Localized swelling, mass and lump, right upper limb: Secondary | ICD-10-CM | POA: Diagnosis not present

## 2020-11-02 ENCOUNTER — Other Ambulatory Visit: Payer: Self-pay

## 2020-11-02 ENCOUNTER — Inpatient Hospital Stay: Payer: Medicare PPO | Attending: Hematology

## 2020-11-02 DIAGNOSIS — D45 Polycythemia vera: Secondary | ICD-10-CM | POA: Diagnosis not present

## 2020-11-02 DIAGNOSIS — D75839 Thrombocytosis, unspecified: Secondary | ICD-10-CM

## 2020-11-02 LAB — CMP (CANCER CENTER ONLY)
ALT: 14 U/L (ref 0–44)
AST: 21 U/L (ref 15–41)
Albumin: 3.8 g/dL (ref 3.5–5.0)
Alkaline Phosphatase: 80 U/L (ref 38–126)
Anion gap: 6 (ref 5–15)
BUN: 20 mg/dL (ref 8–23)
CO2: 30 mmol/L (ref 22–32)
Calcium: 9.1 mg/dL (ref 8.9–10.3)
Chloride: 110 mmol/L (ref 98–111)
Creatinine: 1.29 mg/dL — ABNORMAL HIGH (ref 0.61–1.24)
GFR, Estimated: 59 mL/min — ABNORMAL LOW (ref >60)
Glucose, Bld: 100 mg/dL — ABNORMAL HIGH (ref 70–99)
Potassium: 4.6 mmol/L (ref 3.5–5.1)
Sodium: 146 mmol/L — ABNORMAL HIGH (ref 135–145)
Total Bilirubin: 0.5 mg/dL (ref 0.3–1.2)
Total Protein: 7.2 g/dL (ref 6.5–8.1)

## 2020-11-02 LAB — CBC WITH DIFFERENTIAL/PLATELET
Abs Immature Granulocytes: 0.02 10*3/uL (ref 0.00–0.07)
Basophils Absolute: 0.1 10*3/uL (ref 0.0–0.1)
Basophils Relative: 2 %
Eosinophils Absolute: 0.1 10*3/uL (ref 0.0–0.5)
Eosinophils Relative: 3 %
HCT: 29.7 % — ABNORMAL LOW (ref 39.0–52.0)
Hemoglobin: 9.7 g/dL — ABNORMAL LOW (ref 13.0–17.0)
Immature Granulocytes: 1 %
Lymphocytes Relative: 50 %
Lymphs Abs: 1.5 10*3/uL (ref 0.7–4.0)
MCH: 33.6 pg (ref 26.0–34.0)
MCHC: 32.7 g/dL (ref 30.0–36.0)
MCV: 102.8 fL — ABNORMAL HIGH (ref 80.0–100.0)
Monocytes Absolute: 0.5 10*3/uL (ref 0.1–1.0)
Monocytes Relative: 17 %
Neutro Abs: 0.8 10*3/uL — ABNORMAL LOW (ref 1.7–7.7)
Neutrophils Relative %: 27 %
Platelets: 232 10*3/uL (ref 150–400)
RBC: 2.89 MIL/uL — ABNORMAL LOW (ref 4.22–5.81)
RDW: 16.9 % — ABNORMAL HIGH (ref 11.5–15.5)
WBC: 2.9 10*3/uL — ABNORMAL LOW (ref 4.0–10.5)
nRBC: 0 % (ref 0.0–0.2)

## 2020-11-02 LAB — RETICULOCYTES
Immature Retic Fract: 34.1 % — ABNORMAL HIGH (ref 2.3–15.9)
RBC.: 2.89 MIL/uL — ABNORMAL LOW (ref 4.22–5.81)
Retic Count, Absolute: 72.5 10*3/uL (ref 19.0–186.0)
Retic Ct Pct: 2.5 % (ref 0.4–3.1)

## 2020-11-02 LAB — IMMATURE PLATELET FRACTION: Immature Platelet Fraction: 4.5 % (ref 1.2–8.6)

## 2020-11-03 ENCOUNTER — Telehealth: Payer: Self-pay | Admitting: Hematology

## 2020-11-03 NOTE — Telephone Encounter (Signed)
Left patient a message to verify phone visit visit for pre reg

## 2020-11-04 ENCOUNTER — Inpatient Hospital Stay (HOSPITAL_BASED_OUTPATIENT_CLINIC_OR_DEPARTMENT_OTHER): Payer: Medicare PPO | Admitting: Hematology

## 2020-11-04 DIAGNOSIS — D45 Polycythemia vera: Secondary | ICD-10-CM

## 2020-11-04 LAB — COPPER, SERUM: Copper: 122 ug/dL (ref 69–132)

## 2020-11-04 NOTE — Progress Notes (Signed)
HEMATOLOGY/ONCOLOGY CLINIC NOTE  Date of Service: 11/05/2020  Patient Care Team: [name], MD as PCP - General (Family Medicine)  CHIEF COMPLAINTS/PURPOSE OF CONSULTATION:  Concern for a myeloproliferative neoplasm likely polycythemia vera or essential thrombocytosis  HISTORY OF PRESENTING ILLNESS:  Dillon Hill is a wonderful 74 y.o. male with medical history significant for migraine headaches benign prostatic hypertrophy and hyperlipidemia who presented to the [facility] in [facility] with large hematoma to left arm.  He was noted to have thrombocytopenia.  Other concerns will be noted.  He has been transferred for further work-up here ED Course: In the emergency room patient had work-up including including which was concerning for possible dural venous sinus thrombosis and MRI with and without contrast and MRV to evaluate was slated to be done at [facility]. Patient had no significant symptoms of any kind.   INTERVAL HISTORY:  I connected with  Dillon Hill by telephone and verified that I am speaking with the correct person using two identifiers.   I discussed the limitations of evaluation and management by telemedicine. The patient expressed understanding and agreed to proceed.  Other persons participating in the visit and their role in the encounter:         -Yevette Edwards, Medical Scribe  Patient's location: Home Provider's location: [place] at [place] is a wonderful 74 y.o. male is here for the evaluation and management of polycythemia vera. The patient's last visit with [name] was on 09/21/2020. The pt reports that he is doing well overall.  The pt reports that he has felt well since our last visit. Pt reports more energy and less lightheadedness and dizziness. He is taking 500 mg Hydroxyurea every Monday. He has continued Folic acid, Vitamin C58, and B-complex daily and Eliquis twice per day. Pt denies any recent infections. Pt has  received his annual flu vaccine and COVID19 booster.  Lab results (11/02/20) of CBC w/diff and CMP is as follows: all values are WNL except for WBC at 2.9K, RBC at 2.89, Hgb at 9.7, HCT at 29.7, MCV at 102.8, RDW at 16.9, Neutro Abs at 0.8K, Sodium at 146, Glucose at 100, Creatinine at 1.29, GFR Est at 59. 11/02/2020 Retic Ct Pct at 2.5, Retic Ct Abs at 72.5K, Immature Retic Fract at 34.1. 11/02/2020 Immature PLT Fract at 4.5 11/02/2020 Copper at 122  On review of systems, pt reports improving lightheadedness, dizziness and denies headaches, vision changes, fatigue and any other symptoms.    MEDICAL HISTORY:  Past Medical History:  Diagnosis Date  . BPH (benign prostatic hyperplasia)   . Colon polyp   . DDD (degenerative disc disease), cervical   . High cholesterol   . Migraine   . Polycythemia vera ([place])     SURGICAL HISTORY: Past Surgical History:  Procedure Laterality Date  . EYE SURGERY      SOCIAL HISTORY: Social History   Socioeconomic History  . Marital status: Married    Spouse name: Not on file  . Number of children: Not on file  . Years of education: Not on file  . Highest education level: Not on file  Occupational History  . Not on file  Tobacco Use  . Smoking status: Never Smoker  . Smokeless tobacco: Never Used  . Tobacco comment: smoked x 1 year at age 74  Vaping Use  . Vaping Use: Never used  Substance and Sexual Activity  . Alcohol use: Yes    Comment: occ  .  Drug use: Never  . Sexual activity: Not on file  Other Topics Concern  . Not on file  Social History Narrative  . Not on file   Social Determinants of Health   Financial Resource Strain: Not on file  Food Insecurity: Not on file  Transportation Needs: Not on file  Physical Activity: Not on file  Stress: Not on file  Social Connections: Not on file  Intimate Partner Violence: Not on file    FAMILY HISTORY: No family history on file.  ALLERGIES:  has No Known  Allergies.  MEDICATIONS:  Current Outpatient Medications  Medication Sig Dispense Refill  . amLODipine (NORVASC) 10 MG tablet Take 1 tablet (10 mg total) by mouth daily. 30 tablet 0  . Cyanocobalamin (B-12) 1000 MCG SUBL Place 1,000 mcg under the tongue daily. 30 tablet 5  . ELIQUIS 5 MG TABS tablet TAKE 1 TABLET(5 MG) BY MOUTH TWICE DAILY 60 tablet 2  . folic acid (FOLVITE) 1 MG tablet TAKE 1 TABLET(1 MG) BY MOUTH DAILY 90 tablet 0  . hydroxyurea (HYDREA) 500 MG capsule Take 1 cap (500mg ) orally once weekly on Monday. May take with food to minimize GI side effects. 60 capsule 2  . Multiple Vitamin (MULTIVITAMIN WITH MINERALS) TABS tablet Take 1 tablet by mouth daily.    . SUMAtriptan (IMITREX) 100 MG tablet Take 100 mg by mouth as needed for migraine or headache.   1   No current facility-administered medications for this visit.    REVIEW OF SYSTEMS:   A 10+ POINT REVIEW OF SYSTEMS WAS OBTAINED including neurology, dermatology, psychiatry, cardiac, respiratory, lymph, extremities, GI, GU, Musculoskeletal, constitutional, breasts, reproductive, HEENT.  All pertinent positives are noted in the HPI.  All others are negative.   PHYSICAL EXAMINATION:  Telehealth visit 11/05/2020  LABORATORY DATA:  I have reviewed the data as listed  . CBC Latest Ref Rng & Units 11/02/2020 09/19/2020 08/09/2020  WBC 4.0 - 10.5 K/uL 2.9(L) 2.3(L) 3.4(L)  Hemoglobin 13.0 - 17.0 g/dL 9.7(L) 9.3(L) 8.6(L)  Hematocrit 39.0 - 52.0 % 29.7(L) 28.6(L) 26.6(L)  Platelets 150 - 400 K/uL 232 145(L) 109(L)    . CMP Latest Ref Rng & Units 11/02/2020 09/19/2020 08/09/2020  Glucose 70 - 99 mg/dL 100(H) 69(L) 87  BUN 8 - 23 mg/dL 20 22 19   Creatinine 0.61 - 1.24 mg/dL 1.29(H) 1.33(H) 1.27(H)  Sodium 135 - 145 mmol/L 146(H) 141 139  Potassium 3.5 - 5.1 mmol/L 4.6 3.9 3.8  Chloride 98 - 111 mmol/L 110 108 106  CO2 22 - 32 mmol/L 30 27 28   Calcium 8.9 - 10.3 mg/dL 9.1 9.4 9.2  Total Protein 6.5 - 8.1 g/dL 7.2 6.9 7.1   Total Bilirubin 0.3 - 1.2 mg/dL 0.5 0.6 0.8  Alkaline Phos 38 - 126 U/L 80 63 63  AST 15 - 41 U/L 21 20 20   ALT 0 - 44 U/L 14 10 13    01/19/2020 [id number] Flow Pathology (BM Bx):   01/19/2020 [id number] BM Bx Report:    10/24/2019 JAK2 mutation testing:    RADIOGRAPHIC STUDIES: I have personally reviewed the radiological images as listed and agreed with the findings in the report. No results found.  ASSESSMENT & PLAN:   #1Severe thrombocytosis with platelets of 1005k in diagnosis 10/24/2019 JAK2 GenotypR is "POSITIVE for the detection of the V617F mutation" #2 leukocytosis/primary neutrophilia. #3 polycythemia with a hematocrit of 51.7. #4 Dural venous sinus thrombosis -likely triggered by his polycythemia and thrombocytosis. #5 left arm hematoma likely related  to a bicipital tendon injury. 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 10/24/2019 CALR Mutation analysis is "Negative"  10/24/2019 BCR ABL is "Negative"  01/19/2020 BM Bx ([id number]) revealed "BONE MARROW, ASPIRATE, CLOT, CORE:  - Normocellular marrow with megakaryocytic hyperplasia and atypia  - Documented JAK-2 (V617F) mutation. PERIPHERAL BLOOD: - Pancytopenia." 01/19/2020 BM Bx Flow Pathology ([id number]) revealed "No monoclonal B-cell or phenotypically apparent T-cell, or distinct blast population identified."  PLAN:  -Discussed pt labwork, 11/02/20; PLT are WNL, Hgb is slowly improving, mild neutropenia but WBC are improving, blood chemistries are stable, Copper is WNL, Reticulocytes & Immature PLT Fract look good. -Recommend pt continue 500 mg Hydroxyurea once weekly (Monday). The pt has no prohibitive toxicities at this time.  -No indication for a  therapeutic phlebotomy at this time as HCT< 45. -Continue daily Vitamin X32, Folic acid, & B-complex  -Continue 5 mg Eliquis BID -Will see back in 2 months with labs   FOLLOW UP:  RTC with Dr Irene Limbo with labs in 2 months (patients choice of phone visit or inperson visit)   The total time spent in the appt was 20 minutes and more than 50% was on counseling and direct patient cares.  All of the patient's questions were answered with apparent satisfaction. The patient knows to call the clinic with any problems, questions or concerns.    Sullivan Lone MD [place] AAHIVMS [facility] Hematology/Oncology Physician [facility]  (Office):       [phone] (Work cell):  [phone] (Fax):           [phone]  11/05/2020 2:01 PM  I, Yevette Edwards, am acting as a scribe for Dr. Sullivan Lone.   .I have reviewed the above documentation for accuracy and completeness, and I agree with the above. Brunetta Genera MD

## 2020-11-08 ENCOUNTER — Other Ambulatory Visit: Payer: Self-pay | Admitting: Hematology

## 2020-11-08 DIAGNOSIS — D696 Thrombocytopenia, unspecified: Secondary | ICD-10-CM

## 2020-11-08 DIAGNOSIS — D45 Polycythemia vera: Secondary | ICD-10-CM

## 2020-11-11 ENCOUNTER — Other Ambulatory Visit: Payer: Self-pay | Admitting: Hematology

## 2020-12-27 ENCOUNTER — Telehealth: Payer: Self-pay | Admitting: Hematology

## 2020-12-27 NOTE — Telephone Encounter (Signed)
Called patient regarding upcoming scheduled appointments, patient is notified. 

## 2021-01-04 NOTE — Progress Notes (Signed)
HEMATOLOGY/ONCOLOGY CLINIC NOTE  Date of Service: 01/04/2021  Patient Care Team: [name], MD as PCP - General (Family Medicine)  CHIEF COMPLAINTS/PURPOSE OF CONSULTATION:  Concern for a myeloproliferative neoplasm likely polycythemia vera or essential thrombocytosis  HISTORY OF PRESENTING ILLNESS:  Dillon Hill is a wonderful 75 y.o. male with medical history significant for migraine headaches benign prostatic hypertrophy and hyperlipidemia who presented to the [facility] in [facility] with large hematoma to left arm.  He was noted to have thrombocytopenia.  Other concerns will be noted.  He has been transferred for further work-up here ED Course: In the emergency room patient had work-up including including which was concerning for possible dural venous sinus thrombosis and MRI with and without contrast and MRV to evaluate was slated to be done at [facility] long. Patient had no significant symptoms of any kind.   INTERVAL HISTORY:  Dillon Hill is a wonderful 75 y.o. male is here for the evaluation and management of polycythemia vera. The patient's last visit with [name] was on 11/04/2020. The pt reports that he is doing well overall.  The pt reports that his energy has been improving lately, as well as the lightheadedness. The pt notes no new problems tolerating his Eliquis or Hydroxyurea. He notes no new medication changes. He has not needed to take the Imitrex. The pt notes that he has had a spot on his left ear that is like a scaly lesion.   Lab results today 01/05/2021 of CBC w/diff and CMP is as follows: all values are WNL except for RBC of 3.29, Hgb of 10.8, HCT of 33.2, MCV of 100.9, RDW of 16.6. CMP in progress. 01/05/2021 Immature Retic Fract of 26.9.  01/05/2021 Immature Plt Fract of 5.7.  01/05/2021 Copper in progress.  On review of systems, pt reports intermittent lightheadedness and denies headaches, dizziness, fatigue, bleeding issues, abdominal pain, problems with  urination, leg swelling, back pain, acute skin rashes and any other symptoms.  MEDICAL HISTORY:  Past Medical History:  Diagnosis Date  . BPH (benign prostatic hyperplasia)   . Colon polyp   . DDD (degenerative disc disease), cervical   . High cholesterol   . Migraine   . Polycythemia vera ([place])     SURGICAL HISTORY: Past Surgical History:  Procedure Laterality Date  . EYE SURGERY      SOCIAL HISTORY: Social History   Socioeconomic History  . Marital status: Married    Spouse name: Not on file  . Number of children: Not on file  . Years of education: Not on file  . Highest education level: Not on file  Occupational History  . Not on file  Tobacco Use  . Smoking status: Never Smoker  . Smokeless tobacco: Never Used  . Tobacco comment: smoked x 1 year at age 30  Vaping Use  . Vaping Use: Never used  Substance and Sexual Activity  . Alcohol use: Yes    Comment: occ  . Drug use: Never  . Sexual activity: Not on file  Other Topics Concern  . Not on file  Social History Narrative  . Not on file   Social Determinants of Health   Financial Resource Strain: Not on file  Food Insecurity: Not on file  Transportation Needs: Not on file  Physical Activity: Not on file  Stress: Not on file  Social Connections: Not on file  Intimate Partner Violence: Not on file    FAMILY HISTORY: No family history on file.  ALLERGIES:  has No Known Allergies.  MEDICATIONS:  Current Outpatient Medications  Medication Sig Dispense Refill  . amLODipine (NORVASC) 10 MG tablet Take 1 tablet (10 mg total) by mouth daily. 30 tablet 0  . Cyanocobalamin (B-12) 1000 MCG SUBL Place 1,000 mcg under the tongue daily. 30 tablet 5  . ELIQUIS 5 MG TABS tablet TAKE 1 TABLET(5 MG) BY MOUTH TWICE DAILY 60 tablet 2  . folic acid (FOLVITE) 1 MG tablet TAKE 1 TABLET(1 MG) BY MOUTH DAILY 90 tablet 0  . hydroxyurea (HYDREA) 500 MG capsule Take 1 cap (500mg ) orally once weekly on Monday. May take with  food to minimize GI side effects. 60 capsule 2  . Multiple Vitamin (MULTIVITAMIN WITH MINERALS) TABS tablet Take 1 tablet by mouth daily.    . SUMAtriptan (IMITREX) 100 MG tablet Take 100 mg by mouth as needed for migraine or headache.   1   No current facility-administered medications for this visit.    REVIEW OF SYSTEMS:   10 Point review of Systems was done is negative except as noted above.  PHYSICAL EXAMINATION:   GENERAL:alert, in no acute distress and comfortable SKIN: no acute rashes, no significant lesions EYES: conjunctiva are pink and non-injected, sclera anicteric OROPHARYNX: MMM, no exudates, no oropharyngeal erythema or ulceration NECK: supple, no JVD LYMPH:  no palpable lymphadenopathy in the cervical, axillary or inguinal regions LUNGS: clear to auscultation b/l with normal respiratory effort HEART: regular rate & rhythm ABDOMEN:  normoactive bowel sounds , non tender, not distended. Extremity: no pedal edema PSYCH: alert & oriented x 3 with fluent speech NEURO: no focal motor/sensory deficits  LABORATORY DATA:  I have reviewed the data as listed  . CBC Latest Ref Rng & Units 11/02/2020 09/19/2020 08/09/2020  WBC 4.0 - 10.5 K/uL 2.9(L) 2.3(L) 3.4(L)  Hemoglobin 13.0 - 17.0 g/dL 9.7(L) 9.3(L) 8.6(L)  Hematocrit 39.0 - 52.0 % 29.7(L) 28.6(L) 26.6(L)  Platelets 150 - 400 K/uL 232 145(L) 109(L)    . CMP Latest Ref Rng & Units 11/02/2020 09/19/2020 08/09/2020  Glucose 70 - 99 mg/dL 100(H) 69(L) 87  BUN 8 - 23 mg/dL 20 22 19   Creatinine 0.61 - 1.24 mg/dL 1.29(H) 1.33(H) 1.27(H)  Sodium 135 - 145 mmol/L 146(H) 141 139  Potassium 3.5 - 5.1 mmol/L 4.6 3.9 3.8  Chloride 98 - 111 mmol/L 110 108 106  CO2 22 - 32 mmol/L 30 27 28   Calcium 8.9 - 10.3 mg/dL 9.1 9.4 9.2  Total Protein 6.5 - 8.1 g/dL 7.2 6.9 7.1  Total Bilirubin 0.3 - 1.2 mg/dL 0.5 0.6 0.8  Alkaline Phos 38 - 126 U/L 80 63 63  AST 15 - 41 U/L 21 20 20   ALT 0 - 44 U/L 14 10 13    01/19/2020 [id number]  Flow Pathology (BM Bx):   01/19/2020 [id number] BM Bx Report:    10/24/2019 JAK2 mutation testing:    RADIOGRAPHIC STUDIES: I have personally reviewed the radiological images as listed and agreed with the findings in the report. No results found.  ASSESSMENT & PLAN:   #1Severe thrombocytosis with platelets of 1005k in diagnosis 10/24/2019 JAK2 GenotypR is "POSITIVE for the detection of the V617F mutation" #2 leukocytosis/primary neutrophilia. #3 polycythemia with a hematocrit of 51.7. #4 Dural venous sinus thrombosis -likely triggered by his polycythemia and thrombocytosis. #5 left arm hematoma likely related to a bicipital tendon injury. #6 RBC microcytosis likely from iron depletion from possible MPN  Patient's overall presentation is highly concerning for a newly diagnosed myeloproliferative neoplasm likely  polycythemia vera or essential thrombocytosis.  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 10/24/2019 CALR Mutation analysis is "Negative"  10/24/2019 BCR ABL is "Negative"  01/19/2020 BM Bx ([id number]) revealed "BONE MARROW, ASPIRATE, CLOT, CORE:  - Normocellular marrow with megakaryocytic hyperplasia and atypia  - Documented JAK-2 (V617F) mutation. PERIPHERAL BLOOD: - Pancytopenia." 01/19/2020 BM Bx Flow Pathology ([id number]) revealed "No monoclonal B-cell or phenotypically apparent T-cell, or distinct blast population identified."  PLAN:  -Discussed pt labwork, 01/05/2021; WBC normalized, Hgb increased, other counts stable.  -Advised pt we adjust the Hydroxyurea to control the Plt. Will be as conservative as we can. -Discussed [place] and pt's eligibility. -Recommended pt avoid large iron supplementation at this time.  -Advised pt that goal Hgb is 12-16.  -Recommended pt follow up with skin doctor regarding scaling lesion on left ear. -Recommend pt continue 500 mg Hydroxyurea once weekly (Monday). The pt has no prohibitive  toxicities at this time.  -No indication for a therapeutic phlebotomy at this time as HCT< 45. -Continue daily Vitamin C78, Folic acid, & B-complex  -Continue 5 mg Eliquis BID -Will see back in 2 months via phone. Will get labs prior.    FOLLOW UP:  Phone visit in 2 months w Dr Irene Limbo. Labs 1 day prior to phone visit.    The total time spent in the appointment was 20 minutes and more than 50% was on counseling and direct patient cares.  All of the patient's questions were answered with apparent satisfaction. The patient knows to call the clinic with any problems, questions or concerns.    Sullivan Lone MD [place] AAHIVMS [facility] Hematology/Oncology Physician [facility]  (Office):       [phone] (Work cell):  [phone] (Fax):           [phone]  01/04/2021 12:17 PM  I, Reinaldo Raddle, am acting as scribe for Dr. Sullivan Lone, MD.       .I have reviewed the above documentation for accuracy and completeness, and I agree with the above. Brunetta Genera MD

## 2021-01-05 ENCOUNTER — Other Ambulatory Visit: Payer: Self-pay | Admitting: *Deleted

## 2021-01-05 ENCOUNTER — Inpatient Hospital Stay: Payer: Medicare PPO | Admitting: Hematology

## 2021-01-05 ENCOUNTER — Other Ambulatory Visit: Payer: Self-pay

## 2021-01-05 ENCOUNTER — Inpatient Hospital Stay: Payer: Medicare PPO | Attending: Hematology

## 2021-01-05 VITALS — BP 115/69 | HR 67 | Temp 97.8°F | Resp 15 | Wt 166.0 lb

## 2021-01-05 DIAGNOSIS — I82890 Acute embolism and thrombosis of other specified veins: Secondary | ICD-10-CM | POA: Insufficient documentation

## 2021-01-05 DIAGNOSIS — D751 Secondary polycythemia: Secondary | ICD-10-CM | POA: Insufficient documentation

## 2021-01-05 DIAGNOSIS — D72829 Elevated white blood cell count, unspecified: Secondary | ICD-10-CM | POA: Diagnosis not present

## 2021-01-05 DIAGNOSIS — D45 Polycythemia vera: Secondary | ICD-10-CM | POA: Diagnosis not present

## 2021-01-05 DIAGNOSIS — L988 Other specified disorders of the skin and subcutaneous tissue: Secondary | ICD-10-CM | POA: Insufficient documentation

## 2021-01-05 DIAGNOSIS — D75839 Thrombocytosis, unspecified: Secondary | ICD-10-CM | POA: Diagnosis not present

## 2021-01-05 DIAGNOSIS — Z7901 Long term (current) use of anticoagulants: Secondary | ICD-10-CM | POA: Insufficient documentation

## 2021-01-05 LAB — CBC WITH DIFFERENTIAL (CANCER CENTER ONLY)
Abs Immature Granulocytes: 0.03 10*3/uL (ref 0.00–0.07)
Basophils Absolute: 0 10*3/uL (ref 0.0–0.1)
Basophils Relative: 1 %
Eosinophils Absolute: 0.1 10*3/uL (ref 0.0–0.5)
Eosinophils Relative: 1 %
HCT: 33.2 % — ABNORMAL LOW (ref 39.0–52.0)
Hemoglobin: 10.8 g/dL — ABNORMAL LOW (ref 13.0–17.0)
Immature Granulocytes: 0 %
Lymphocytes Relative: 17 %
Lymphs Abs: 1.5 10*3/uL (ref 0.7–4.0)
MCH: 32.8 pg (ref 26.0–34.0)
MCHC: 32.5 g/dL (ref 30.0–36.0)
MCV: 100.9 fL — ABNORMAL HIGH (ref 80.0–100.0)
Monocytes Absolute: 1 10*3/uL (ref 0.1–1.0)
Monocytes Relative: 12 %
Neutro Abs: 5.7 10*3/uL (ref 1.7–7.7)
Neutrophils Relative %: 69 %
Platelet Count: 297 10*3/uL (ref 150–400)
RBC: 3.29 MIL/uL — ABNORMAL LOW (ref 4.22–5.81)
RDW: 16.6 % — ABNORMAL HIGH (ref 11.5–15.5)
WBC Count: 8.3 10*3/uL (ref 4.0–10.5)
nRBC: 0 % (ref 0.0–0.2)

## 2021-01-05 LAB — CMP (CANCER CENTER ONLY)
ALT: 11 U/L (ref 0–44)
AST: 30 U/L (ref 15–41)
Albumin: 4.1 g/dL (ref 3.5–5.0)
Alkaline Phosphatase: 72 U/L (ref 38–126)
Anion gap: 6 (ref 5–15)
BUN: 23 mg/dL (ref 8–23)
CO2: 26 mmol/L (ref 22–32)
Calcium: 9.2 mg/dL (ref 8.9–10.3)
Chloride: 107 mmol/L (ref 98–111)
Creatinine: 1.39 mg/dL — ABNORMAL HIGH (ref 0.61–1.24)
GFR, Estimated: 54 mL/min — ABNORMAL LOW (ref >60)
Glucose, Bld: 84 mg/dL (ref 70–99)
Potassium: 4.2 mmol/L (ref 3.5–5.1)
Sodium: 139 mmol/L (ref 135–145)
Total Bilirubin: 0.5 mg/dL (ref 0.3–1.2)
Total Protein: 7.4 g/dL (ref 6.5–8.1)

## 2021-01-05 LAB — RETICULOCYTES
Immature Retic Fract: 26.9 % — ABNORMAL HIGH (ref 2.3–15.9)
RBC.: 3.33 MIL/uL — ABNORMAL LOW (ref 4.22–5.81)
Retic Count, Absolute: 63.6 10*3/uL (ref 19.0–186.0)
Retic Ct Pct: 1.9 % (ref 0.4–3.1)

## 2021-01-05 LAB — IMMATURE PLATELET FRACTION: Immature Platelet Fraction: 5.7 % (ref 1.2–8.6)

## 2021-01-08 LAB — COPPER, SERUM: Copper: 105 ug/dL (ref 69–132)

## 2021-02-04 ENCOUNTER — Other Ambulatory Visit: Payer: Self-pay | Admitting: Hematology

## 2021-02-04 DIAGNOSIS — D45 Polycythemia vera: Secondary | ICD-10-CM

## 2021-02-04 DIAGNOSIS — D696 Thrombocytopenia, unspecified: Secondary | ICD-10-CM

## 2021-02-10 ENCOUNTER — Other Ambulatory Visit: Payer: Self-pay | Admitting: Hematology

## 2021-03-01 ENCOUNTER — Telehealth: Payer: Self-pay | Admitting: Hematology

## 2021-03-01 NOTE — Telephone Encounter (Signed)
Rescheduled upcoming appointment due to provider's PAL. Patient is aware of changes. ?

## 2021-03-02 ENCOUNTER — Other Ambulatory Visit: Payer: Medicare PPO

## 2021-03-08 ENCOUNTER — Other Ambulatory Visit: Payer: Medicare PPO

## 2021-03-09 ENCOUNTER — Ambulatory Visit: Payer: Medicare PPO | Admitting: Hematology

## 2021-03-24 ENCOUNTER — Other Ambulatory Visit: Payer: Self-pay

## 2021-03-24 DIAGNOSIS — D45 Polycythemia vera: Secondary | ICD-10-CM

## 2021-03-27 ENCOUNTER — Other Ambulatory Visit: Payer: Self-pay

## 2021-03-27 ENCOUNTER — Inpatient Hospital Stay: Payer: Medicare PPO | Attending: Hematology

## 2021-03-27 DIAGNOSIS — D45 Polycythemia vera: Secondary | ICD-10-CM | POA: Insufficient documentation

## 2021-03-27 LAB — CBC WITH DIFFERENTIAL (CANCER CENTER ONLY)
Abs Immature Granulocytes: 0.03 10*3/uL (ref 0.00–0.07)
Basophils Absolute: 0.1 10*3/uL (ref 0.0–0.1)
Basophils Relative: 2 %
Eosinophils Absolute: 0.2 10*3/uL (ref 0.0–0.5)
Eosinophils Relative: 4 %
HCT: 37.4 % — ABNORMAL LOW (ref 39.0–52.0)
Hemoglobin: 11.9 g/dL — ABNORMAL LOW (ref 13.0–17.0)
Immature Granulocytes: 1 %
Lymphocytes Relative: 29 %
Lymphs Abs: 1.3 10*3/uL (ref 0.7–4.0)
MCH: 31.3 pg (ref 26.0–34.0)
MCHC: 31.8 g/dL (ref 30.0–36.0)
MCV: 98.4 fL (ref 80.0–100.0)
Monocytes Absolute: 0.6 10*3/uL (ref 0.1–1.0)
Monocytes Relative: 14 %
Neutro Abs: 2.1 10*3/uL (ref 1.7–7.7)
Neutrophils Relative %: 50 %
Platelet Count: 437 10*3/uL — ABNORMAL HIGH (ref 150–400)
RBC: 3.8 MIL/uL — ABNORMAL LOW (ref 4.22–5.81)
RDW: 16.7 % — ABNORMAL HIGH (ref 11.5–15.5)
WBC Count: 4.3 10*3/uL (ref 4.0–10.5)
nRBC: 0 % (ref 0.0–0.2)

## 2021-03-27 LAB — CMP (CANCER CENTER ONLY)
ALT: 11 U/L (ref 0–44)
AST: 23 U/L (ref 15–41)
Albumin: 4.2 g/dL (ref 3.5–5.0)
Alkaline Phosphatase: 83 U/L (ref 38–126)
Anion gap: 11 (ref 5–15)
BUN: 18 mg/dL (ref 8–23)
CO2: 26 mmol/L (ref 22–32)
Calcium: 9.4 mg/dL (ref 8.9–10.3)
Chloride: 106 mmol/L (ref 98–111)
Creatinine: 1.42 mg/dL — ABNORMAL HIGH (ref 0.61–1.24)
GFR, Estimated: 52 mL/min — ABNORMAL LOW (ref >60)
Glucose, Bld: 87 mg/dL (ref 70–99)
Potassium: 4.5 mmol/L (ref 3.5–5.1)
Sodium: 143 mmol/L (ref 135–145)
Total Bilirubin: 0.6 mg/dL (ref 0.3–1.2)
Total Protein: 7.6 g/dL (ref 6.5–8.1)

## 2021-03-27 LAB — RETICULOCYTES
Immature Retic Fract: 31.8 % — ABNORMAL HIGH (ref 2.3–15.9)
RBC.: 3.81 MIL/uL — ABNORMAL LOW (ref 4.22–5.81)
Retic Count, Absolute: 72.8 10*3/uL (ref 19.0–186.0)
Retic Ct Pct: 1.9 % (ref 0.4–3.1)

## 2021-03-27 LAB — IMMATURE PLATELET FRACTION: Immature Platelet Fraction: 5.2 % (ref 1.2–8.6)

## 2021-03-27 NOTE — Progress Notes (Signed)
HEMATOLOGY/ONCOLOGY CLINIC NOTE  Date of Service: 03/28/2021  Patient Care Team: [name], MD as PCP - General (Family Medicine)  CHIEF COMPLAINTS/PURPOSE OF CONSULTATION:  F/u for polycythemia vera  HISTORY OF PRESENTING ILLNESS:  Dillon Hill is a wonderful 75 y.o. male with medical history significant for migraine headaches benign prostatic hypertrophy and hyperlipidemia who presented to the [facility] in [facility] with large hematoma to left arm.  He was noted to have thrombocytopenia.  Other concerns will be noted.  He has been transferred for further work-up here ED Course: In the emergency room patient had work-up including including which was concerning for possible dural venous sinus thrombosis and MRI with and without contrast and MRV to evaluate was slated to be done at [facility] long. Patient had no significant symptoms of any kind.   INTERVAL HISTORY:  I connected with Dillon Hill on 03/28/2021 by telephone and verified that I am speaking with the correct person using two identifiers.   I discussed the limitations of evaluation and management by telemedicine. The patient expressed understanding and agreed to proceed.   Other persons participating in the visit and their role in the encounter:                                                         - Reinaldo Raddle, Medical Scribe     Patient's location: Home Provider's location: [facility] [place] is a wonderful 75 y.o. male is here for the evaluation and management of polycythemia vera. The patient's last visit with [name] was on 01/05/2021. The pt reports that he is doing well overall.  The pt reports that he is almost at his baseline again, with no new symptoms or concerns. He has been gradually improving. The pt notes no new medication changes or medical issues. The pt notes he seldom has headaches anymore.  Lab results 03/27/2021 of CBC w/diff and CMP is as follows: all values are WNL except for RBC  of 3.80, Hgb of 11.9, HCT of 37.4, RDW of 16.7, Plt of 437K, Creatinine of 1.42, GFR est of 52. 03/27/2021 Immature Plt Fract of 5.2. 03/27/2021 Immature Retic Fract of 31.8. 03/27/2021 Copper is 84..  On review of systems, pt denies fatigue, fevers, chills, sudden weight loss, acute infection issues, headaches, and any other symptoms.  MEDICAL HISTORY:  Past Medical History:  Diagnosis Date  . BPH (benign prostatic hyperplasia)   . Colon polyp   . DDD (degenerative disc disease), cervical   . High cholesterol   . Migraine   . Polycythemia vera ([place])     SURGICAL HISTORY: Past Surgical History:  Procedure Laterality Date  . EYE SURGERY      SOCIAL HISTORY: Social History   Socioeconomic History  . Marital status: Married    Spouse name: Not on file  . Number of children: Not on file  . Years of education: Not on file  . Highest education level: Not on file  Occupational History  . Not on file  Tobacco Use  . Smoking status: Never Smoker  . Smokeless tobacco: Never Used  . Tobacco comment: smoked x 1 year at age 46  Vaping Use  . Vaping Use: Never used  Substance and Sexual Activity  . Alcohol use: Yes  Comment: occ  . Drug use: Never  . Sexual activity: Not on file  Other Topics Concern  . Not on file  Social History Narrative  . Not on file   Social Determinants of Health   Financial Resource Strain: Not on file  Food Insecurity: Not on file  Transportation Needs: Not on file  Physical Activity: Not on file  Stress: Not on file  Social Connections: Not on file  Intimate Partner Violence: Not on file    FAMILY HISTORY: No family history on file.  ALLERGIES:  has No Known Allergies.  MEDICATIONS:  Current Outpatient Medications  Medication Sig Dispense Refill  . amLODipine (NORVASC) 10 MG tablet Take 1 tablet (10 mg total) by mouth daily. 30 tablet 0  . Cyanocobalamin (B-12) 1000 MCG SUBL Place 1,000 mcg under the tongue daily. 30 tablet 5  .  ELIQUIS 5 MG TABS tablet TAKE 1 TABLET(5 MG) BY MOUTH TWICE DAILY 60 tablet 2  . folic acid (FOLVITE) 1 MG tablet TAKE 1 TABLET(1 MG) BY MOUTH DAILY 90 tablet 0  . hydroxyurea (HYDREA) 500 MG capsule Take 1 cap (500mg ) orally once weekly on Monday. May take with food to minimize GI side effects. 60 capsule 2  . Multiple Vitamin (MULTIVITAMIN WITH MINERALS) TABS tablet Take 1 tablet by mouth daily.    . SUMAtriptan (IMITREX) 100 MG tablet Take 100 mg by mouth as needed for migraine or headache.   1   No current facility-administered medications for this visit.    REVIEW OF SYSTEMS:   10 Point review of Systems was done is negative except as noted above.  PHYSICAL EXAMINATION:  Telehealth Visit.  LABORATORY DATA:  I have reviewed the data as listed  . CBC Latest Ref Rng & Units 03/27/2021 01/05/2021 11/02/2020  WBC 4.0 - 10.5 K/uL 4.3 8.3 2.9(L)  Hemoglobin 13.0 - 17.0 g/dL 11.9(L) 10.8(L) 9.7(L)  Hematocrit 39.0 - 52.0 % 37.4(L) 33.2(L) 29.7(L)  Platelets 150 - 400 K/uL 437(H) 297 232    . CMP Latest Ref Rng & Units 03/27/2021 01/05/2021 11/02/2020  Glucose 70 - 99 mg/dL 87 84 100(H)  BUN 8 - 23 mg/dL 18 23 20   Creatinine 0.61 - 1.24 mg/dL 1.42(H) 1.39(H) 1.29(H)  Sodium 135 - 145 mmol/L 143 139 146(H)  Potassium 3.5 - 5.1 mmol/L 4.5 4.2 4.6  Chloride 98 - 111 mmol/L 106 107 110  CO2 22 - 32 mmol/L 26 26 30   Calcium 8.9 - 10.3 mg/dL 9.4 9.2 9.1  Total Protein 6.5 - 8.1 g/dL 7.6 7.4 7.2  Total Bilirubin 0.3 - 1.2 mg/dL 0.6 0.5 0.5  Alkaline Phos 38 - 126 U/L 83 72 80  AST 15 - 41 U/L 23 30 21   ALT 0 - 44 U/L 11 11 14    01/19/2020 [id number] Flow Pathology (BM Bx):   01/19/2020 [id number] BM Bx Report:    10/24/2019 JAK2 mutation testing:    RADIOGRAPHIC STUDIES: I have personally reviewed the radiological images as listed and agreed with the findings in the report. No results found.  ASSESSMENT & PLAN:   #1Severe thrombocytosis with platelets of 1005k in  diagnosis 10/24/2019 JAK2 GenotypR is "POSITIVE for the detection of the V617F mutation" #2 leukocytosis/primary neutrophilia. #3 polycythemia with a hematocrit of 51.7. #4 Dural venous sinus thrombosis -likely triggered by his polycythemia and thrombocytosis. #5 left arm hematoma likely related to a bicipital tendon injury. #6 RBC microcytosis likely from iron depletion from possible MPN  Patient's overall presentation is highly concerning  for a newly diagnosed myeloproliferative neoplasm likely polycythemia vera or essential thrombocytosis.  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 10/24/2019 CALR Mutation analysis is "Negative"  10/24/2019 BCR ABL is "Negative"  01/19/2020 BM Bx ([id number]) revealed "BONE MARROW, ASPIRATE, CLOT, CORE:  - Normocellular marrow with megakaryocytic hyperplasia and atypia  - Documented JAK-2 (V617F) mutation. PERIPHERAL BLOOD: - Pancytopenia." 01/19/2020 BM Bx Flow Pathology ([id number]) revealed "No monoclonal B-cell or phenotypically apparent T-cell, or distinct blast population identified."  PLAN:  -Discussed pt labwork, 03/27/2021; Plt starting to increase, Hgb improved, other blood counts improved, chemistries stable. -Advised pt to increase 500 mg Hydroxyurea to twice weekly (Monday & Thursday). The pt has no prohibitive toxicities at this time.  -No indication for a therapeutic phlebotomy at this time as HCT< 45. -Continue daily Vitamin O75, Folic acid, & B-complex  -Continue 5 mg Eliquis BID -Will see back in 2 months via phone with labs prior to this.   FOLLOW UP:  Phone Visit with Dr Irene Limbo in 2 months. Please schedule the labs the day prior to visit.   The total time spent in the appointment was 20 minutes and more than 50% was on counseling and direct patient cares.  All of the patient's questions were answered with apparent satisfaction. The patient knows to call the clinic with any problems, questions or  concerns.    Sullivan Lone MD [place] AAHIVMS [facility] Hematology/Oncology Physician [facility]  (Office):       [phone] (Work cell):  [phone] (Fax):           [phone]  03/28/2021 3:52 PM  I, Reinaldo Raddle, am acting as scribe for Dr. Sullivan Lone, MD.    .I have reviewed the above documentation for accuracy and completeness, and I agree with the above. Brunetta Genera MD

## 2021-03-28 ENCOUNTER — Inpatient Hospital Stay (HOSPITAL_BASED_OUTPATIENT_CLINIC_OR_DEPARTMENT_OTHER): Payer: Medicare PPO | Admitting: Hematology

## 2021-03-28 DIAGNOSIS — D45 Polycythemia vera: Secondary | ICD-10-CM | POA: Diagnosis not present

## 2021-03-29 ENCOUNTER — Telehealth: Payer: Self-pay | Admitting: Hematology

## 2021-03-29 NOTE — Telephone Encounter (Signed)
Scheduled follow-up appointments per 5/10 los. Patient is aware. ?

## 2021-03-30 LAB — COPPER, SERUM: Copper: 84 ug/dL (ref 69–132)

## 2021-04-22 ENCOUNTER — Other Ambulatory Visit: Payer: Self-pay | Admitting: Hematology

## 2021-04-24 ENCOUNTER — Encounter: Payer: Self-pay | Admitting: Hematology

## 2021-05-29 ENCOUNTER — Inpatient Hospital Stay: Payer: Medicare PPO | Attending: Hematology

## 2021-05-29 ENCOUNTER — Other Ambulatory Visit: Payer: Self-pay

## 2021-05-29 DIAGNOSIS — D45 Polycythemia vera: Secondary | ICD-10-CM | POA: Insufficient documentation

## 2021-05-29 LAB — CMP (CANCER CENTER ONLY)
ALT: 8 U/L (ref 0–44)
AST: 18 U/L (ref 15–41)
Albumin: 3.8 g/dL (ref 3.5–5.0)
Alkaline Phosphatase: 80 U/L (ref 38–126)
Anion gap: 7 (ref 5–15)
BUN: 19 mg/dL (ref 8–23)
CO2: 28 mmol/L (ref 22–32)
Calcium: 9.4 mg/dL (ref 8.9–10.3)
Chloride: 107 mmol/L (ref 98–111)
Creatinine: 1.31 mg/dL — ABNORMAL HIGH (ref 0.61–1.24)
GFR, Estimated: 57 mL/min — ABNORMAL LOW (ref >60)
Glucose, Bld: 97 mg/dL (ref 70–99)
Potassium: 4 mmol/L (ref 3.5–5.1)
Sodium: 142 mmol/L (ref 135–145)
Total Bilirubin: 0.5 mg/dL (ref 0.3–1.2)
Total Protein: 7.3 g/dL (ref 6.5–8.1)

## 2021-05-29 LAB — CBC WITH DIFFERENTIAL/PLATELET
Abs Immature Granulocytes: 0.02 10*3/uL (ref 0.00–0.07)
Basophils Absolute: 0.1 10*3/uL (ref 0.0–0.1)
Basophils Relative: 2 %
Eosinophils Absolute: 0.2 10*3/uL (ref 0.0–0.5)
Eosinophils Relative: 6 %
HCT: 36.7 % — ABNORMAL LOW (ref 39.0–52.0)
Hemoglobin: 12 g/dL — ABNORMAL LOW (ref 13.0–17.0)
Immature Granulocytes: 1 %
Lymphocytes Relative: 36 %
Lymphs Abs: 1.5 10*3/uL (ref 0.7–4.0)
MCH: 31.4 pg (ref 26.0–34.0)
MCHC: 32.7 g/dL (ref 30.0–36.0)
MCV: 96.1 fL (ref 80.0–100.0)
Monocytes Absolute: 0.5 10*3/uL (ref 0.1–1.0)
Monocytes Relative: 12 %
Neutro Abs: 1.8 10*3/uL (ref 1.7–7.7)
Neutrophils Relative %: 43 %
Platelets: 331 10*3/uL (ref 150–400)
RBC: 3.82 MIL/uL — ABNORMAL LOW (ref 4.22–5.81)
RDW: 18.6 % — ABNORMAL HIGH (ref 11.5–15.5)
WBC: 4 10*3/uL (ref 4.0–10.5)
nRBC: 0 % (ref 0.0–0.2)

## 2021-05-29 LAB — RETICULOCYTES
Immature Retic Fract: 37.1 % — ABNORMAL HIGH (ref 2.3–15.9)
RBC.: 3.89 MIL/uL — ABNORMAL LOW (ref 4.22–5.81)
Retic Count, Absolute: 69.6 10*3/uL (ref 19.0–186.0)
Retic Ct Pct: 1.8 % (ref 0.4–3.1)

## 2021-05-29 NOTE — Progress Notes (Signed)
HEMATOLOGY/ONCOLOGY CLINIC NOTE  Date of Service: 05/30/2021  Patient Care Team: [name], MD as PCP - General (Family Medicine)  CHIEF COMPLAINTS/PURPOSE OF CONSULTATION:  F/u for polycythemia vera  HISTORY OF PRESENTING ILLNESS:  Dillon Hill is a wonderful 75 y.o. male with medical history significant for migraine headaches benign prostatic hypertrophy and hyperlipidemia who presented to the [facility] in [facility] with large hematoma to left arm.  He was noted to have thrombocytopenia.  Other concerns will be noted.  He has been transferred for further work-up here ED Course: In the emergency room patient had work-up including including which was concerning for possible dural venous sinus thrombosis and MRI with and without contrast and MRV to evaluate was slated to be done at [facility] long. Patient had no significant symptoms of any kind.   INTERVAL HISTORY:  I connected with Dillon Hill on 05/30/2021 by telephone and verified that I am speaking with the correct person using two identifiers.   I discussed the limitations of evaluation and management by telemedicine. The patient expressed understanding and agreed to proceed.   Other persons participating in the visit and their role in the encounter:                                                         - Reinaldo Raddle, Medical Scribe     Patient's location: Home Provider's location: [place] is a wonderful 75 y.o. male is here for the evaluation and management of polycythemia vera. The patient's last visit with [name] was on 03/28/2021. The pt reports that he is doing well overall.  The pt reports no new symptoms or concerns. He notes no issues with the Eliquis and is continuing to take his vitamins. He denies any medication changes. The patient notes some concern over dementia, as his mother developed this around when she was 35.  Lab results 05/29/2021 of CBC w/diff and CMP is as follows: all  values are WNL except for RBC of 3.82, Hgb of 12.0, HCT of 36.7, RDW of 18.6, Creatinine of 1.31, GFR est of 57. 05/29/2021 Immature Retic Fract of 37.1.  On review of systems, pt reports memory loss, difficulty focusing, stress and denies depression, bleeding issues, headaches, and any other symptoms.   MEDICAL HISTORY:  Past Medical History:  Diagnosis Date   BPH (benign prostatic hyperplasia)    Colon polyp    DDD (degenerative disc disease), cervical    High cholesterol    Migraine    Polycythemia vera ([place])     SURGICAL HISTORY: Past Surgical History:  Procedure Laterality Date   EYE SURGERY      SOCIAL HISTORY: Social History   Socioeconomic History   Marital status: Married    Spouse name: Not on file   Number of children: Not on file   Years of education: Not on file   Highest education level: Not on file  Occupational History   Not on file  Tobacco Use   Smoking status: Never   Smokeless tobacco: Never   Tobacco comments:    smoked x 1 year at age 7  Vaping Use   Vaping Use: Never used  Substance and Sexual Activity   Alcohol use: Yes    Comment: occ   Drug use:  Never   Sexual activity: Not on file  Other Topics Concern   Not on file  Social History Narrative   Not on file   Social Determinants of Health   Financial Resource Strain: Not on file  Food Insecurity: Not on file  Transportation Needs: Not on file  Physical Activity: Not on file  Stress: Not on file  Social Connections: Not on file  Intimate Partner Violence: Not on file    FAMILY HISTORY: No family history on file.  ALLERGIES:  has No Known Allergies.  MEDICATIONS:  Current Outpatient Medications  Medication Sig Dispense Refill   amLODipine (NORVASC) 10 MG tablet Take 1 tablet (10 mg total) by mouth daily. 30 tablet 0   Cyanocobalamin (B-12) 1000 MCG SUBL Place 1,000 mcg under the tongue daily. 30 tablet 5   ELIQUIS 5 MG TABS tablet TAKE 1 TABLET(5 MG) BY MOUTH TWICE DAILY  60 tablet 2   folic acid (FOLVITE) 1 MG tablet TAKE 1 TABLET(1 MG) BY MOUTH DAILY 90 tablet 0   hydroxyurea (HYDREA) 500 MG capsule Take 1 cap (500mg ) orally once weekly on Monday. May take with food to minimize GI side effects. 60 capsule 2   Multiple Vitamin (MULTIVITAMIN WITH MINERALS) TABS tablet Take 1 tablet by mouth daily.     SUMAtriptan (IMITREX) 100 MG tablet Take 100 mg by mouth as needed for migraine or headache.   1   No current facility-administered medications for this visit.    REVIEW OF SYSTEMS:   10 Point review of Systems was done is negative except as noted above.  PHYSICAL EXAMINATION:  Telehealth Visit.  LABORATORY DATA:  I have reviewed the data as listed  . CBC Latest Ref Rng & Units 05/29/2021 03/27/2021 01/05/2021  WBC 4.0 - 10.5 K/uL 4.0 4.3 8.3  Hemoglobin 13.0 - 17.0 g/dL 12.0(L) 11.9(L) 10.8(L)  Hematocrit 39.0 - 52.0 % 36.7(L) 37.4(L) 33.2(L)  Platelets 150 - 400 K/uL 331 437(H) 297    . CMP Latest Ref Rng & Units 05/29/2021 03/27/2021 01/05/2021  Glucose 70 - 99 mg/dL 97 87 84  BUN 8 - 23 mg/dL 19 18 23   Creatinine 0.61 - 1.24 mg/dL 1.31(H) 1.42(H) 1.39(H)  Sodium 135 - 145 mmol/L 142 143 139  Potassium 3.5 - 5.1 mmol/L 4.0 4.5 4.2  Chloride 98 - 111 mmol/L 107 106 107  CO2 22 - 32 mmol/L 28 26 26   Calcium 8.9 - 10.3 mg/dL 9.4 9.4 9.2  Total Protein 6.5 - 8.1 g/dL 7.3 7.6 7.4  Total Bilirubin 0.3 - 1.2 mg/dL 0.5 0.6 0.5  Alkaline Phos 38 - 126 U/L 80 83 72  AST 15 - 41 U/L 18 23 30   ALT 0 - 44 U/L 8 11 11    01/19/2020 [id number] Flow Pathology (BM Bx):   01/19/2020 [id number] BM Bx Report:    10/24/2019 JAK2 mutation testing:    RADIOGRAPHIC STUDIES: I have personally reviewed the radiological images as listed and agreed with the findings in the report. No results found.  ASSESSMENT & PLAN:   #1 Severe thrombocytosis with platelets of 1005k in diagnosis 10/24/2019 JAK2 GenotypR is "POSITIVE for the detection of the V617F  mutation" #2 leukocytosis/primary neutrophilia. #3 polycythemia with a hematocrit of 51.7. #4 Dural venous sinus thrombosis -likely triggered by his polycythemia and thrombocytosis. #5 left arm hematoma likely related to a bicipital tendon injury. #6 RBC microcytosis likely from iron depletion from possible MPN   Patient's overall presentation is highly concerning for a newly diagnosed  myeloproliferative neoplasm likely polycythemia vera or essential thrombocytosis.  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 10/24/2019 CALR Mutation analysis is "Negative"  10/24/2019 BCR ABL is "Negative"  01/19/2020 BM Bx ([id number]) revealed "BONE MARROW, ASPIRATE, CLOT, CORE:  -  Normocellular marrow with megakaryocytic hyperplasia and atypia  -  Documented JAK-2 (V617F) mutation. PERIPHERAL BLOOD: -  Pancytopenia." 01/19/2020 BM Bx Flow Pathology ([id number]) revealed " No monoclonal B-cell or phenotypically apparent T-cell, or distinct blast population identified."  PLAN:  -Discussed pt labwork, 05/29/2021; plt remained normal, Hgb improved, WBC normal. Chemistries stable. -Advised pt that all counts are where we want them at now.  -continue Hydroxyurea twice weekly -Recommended pt connect with PCP regarding concerns over dementia and memory loss. -Advised pt that stress and depression cause a psuedo-dementia like symptoms. -Recommended pt stay active. -Continue daily Vitamin J49, Folic acid, & B-complex  -Continue 5 mg Eliquis BID -Will see back in 10 weeks via phone. Labs day prior.   FOLLOW UP:  Phone Visit with Dr Irene Limbo in 10 weeks. Please schedule the labs the day prior to visit.    The total time spent in the appointment was 25 minutes and more than 50% was on counseling and direct patient cares.  All of the patient's questions were answered with apparent satisfaction. The patient knows to call the clinic with any problems, questions or concerns.    Sullivan Lone MD [place] AAHIVMS [facility] Hematology/Oncology Physician [facility]  (Office):       [phone] (Work cell):  [phone] (Fax):           [phone]  05/30/2021 10:25 AM  I, Reinaldo Raddle, am acting as scribe for Dr. Sullivan Lone, MD.  .I have reviewed the above documentation for accuracy and completeness, and I agree with the above. Brunetta Genera MD

## 2021-05-30 ENCOUNTER — Inpatient Hospital Stay (HOSPITAL_BASED_OUTPATIENT_CLINIC_OR_DEPARTMENT_OTHER): Payer: Medicare PPO | Admitting: Hematology

## 2021-05-30 DIAGNOSIS — D75839 Thrombocytosis, unspecified: Secondary | ICD-10-CM

## 2021-05-30 DIAGNOSIS — D45 Polycythemia vera: Secondary | ICD-10-CM

## 2021-05-31 ENCOUNTER — Telehealth: Payer: Self-pay | Admitting: Hematology

## 2021-05-31 NOTE — Telephone Encounter (Signed)
Scheduled follow-up appointments per 7/12 los. Patient is aware. 

## 2021-06-05 ENCOUNTER — Encounter: Payer: Self-pay | Admitting: Hematology

## 2021-06-05 MED ORDER — HYDROXYUREA 500 MG PO CAPS: 500.0000 mg | ORAL_CAPSULE | ORAL | 2 refills | Status: DC

## 2021-06-13 ENCOUNTER — Other Ambulatory Visit: Payer: Self-pay

## 2021-06-13 DIAGNOSIS — D75839 Thrombocytosis, unspecified: Secondary | ICD-10-CM

## 2021-06-13 MED ORDER — HYDROXYUREA 500 MG PO CAPS: 500.0000 mg | ORAL_CAPSULE | ORAL | 2 refills | Status: DC

## 2021-07-12 DIAGNOSIS — I10 Essential (primary) hypertension: Secondary | ICD-10-CM | POA: Diagnosis not present

## 2021-07-12 DIAGNOSIS — E7800 Pure hypercholesterolemia, unspecified: Secondary | ICD-10-CM | POA: Diagnosis not present

## 2021-07-12 DIAGNOSIS — Z0000 Encounter for general adult medical examination without abnormal findings: Secondary | ICD-10-CM | POA: Diagnosis not present

## 2021-07-12 DIAGNOSIS — R413 Other amnesia: Secondary | ICD-10-CM | POA: Diagnosis not present

## 2021-07-12 DIAGNOSIS — D45 Polycythemia vera: Secondary | ICD-10-CM | POA: Diagnosis not present

## 2021-07-12 DIAGNOSIS — Z1211 Encounter for screening for malignant neoplasm of colon: Secondary | ICD-10-CM | POA: Diagnosis not present

## 2021-07-12 DIAGNOSIS — Z125 Encounter for screening for malignant neoplasm of prostate: Secondary | ICD-10-CM | POA: Diagnosis not present

## 2021-07-12 DIAGNOSIS — N400 Enlarged prostate without lower urinary tract symptoms: Secondary | ICD-10-CM | POA: Diagnosis not present

## 2021-07-12 DIAGNOSIS — L989 Disorder of the skin and subcutaneous tissue, unspecified: Secondary | ICD-10-CM | POA: Diagnosis not present

## 2021-08-05 ENCOUNTER — Other Ambulatory Visit: Payer: Self-pay | Admitting: Hematology

## 2021-08-07 ENCOUNTER — Inpatient Hospital Stay: Payer: Medicare PPO | Attending: Hematology

## 2021-08-07 ENCOUNTER — Encounter: Payer: Self-pay | Admitting: Hematology

## 2021-08-07 ENCOUNTER — Other Ambulatory Visit: Payer: Self-pay

## 2021-08-07 DIAGNOSIS — D45 Polycythemia vera: Secondary | ICD-10-CM | POA: Diagnosis not present

## 2021-08-07 DIAGNOSIS — D75839 Thrombocytosis, unspecified: Secondary | ICD-10-CM | POA: Diagnosis not present

## 2021-08-07 LAB — CMP (CANCER CENTER ONLY)
ALT: 11 U/L (ref 0–44)
AST: 18 U/L (ref 15–41)
Albumin: 4.1 g/dL (ref 3.5–5.0)
Alkaline Phosphatase: 67 U/L (ref 38–126)
Anion gap: 11 (ref 5–15)
BUN: 16 mg/dL (ref 8–23)
CO2: 27 mmol/L (ref 22–32)
Calcium: 9.7 mg/dL (ref 8.9–10.3)
Chloride: 106 mmol/L (ref 98–111)
Creatinine: 1.34 mg/dL — ABNORMAL HIGH (ref 0.61–1.24)
GFR, Estimated: 56 mL/min — ABNORMAL LOW (ref >60)
Glucose, Bld: 70 mg/dL (ref 70–99)
Potassium: 3.7 mmol/L (ref 3.5–5.1)
Sodium: 144 mmol/L (ref 135–145)
Total Bilirubin: 0.5 mg/dL (ref 0.3–1.2)
Total Protein: 7.2 g/dL (ref 6.5–8.1)

## 2021-08-07 LAB — CBC WITH DIFFERENTIAL (CANCER CENTER ONLY)
Abs Immature Granulocytes: 0.04 10*3/uL (ref 0.00–0.07)
Basophils Absolute: 0.1 10*3/uL (ref 0.0–0.1)
Basophils Relative: 2 %
Eosinophils Absolute: 0.2 10*3/uL (ref 0.0–0.5)
Eosinophils Relative: 4 %
HCT: 35.8 % — ABNORMAL LOW (ref 39.0–52.0)
Hemoglobin: 11.9 g/dL — ABNORMAL LOW (ref 13.0–17.0)
Immature Granulocytes: 1 %
Lymphocytes Relative: 26 %
Lymphs Abs: 1.5 10*3/uL (ref 0.7–4.0)
MCH: 32.1 pg (ref 26.0–34.0)
MCHC: 33.2 g/dL (ref 30.0–36.0)
MCV: 96.5 fL (ref 80.0–100.0)
Monocytes Absolute: 0.7 10*3/uL (ref 0.1–1.0)
Monocytes Relative: 12 %
Neutro Abs: 3.2 10*3/uL (ref 1.7–7.7)
Neutrophils Relative %: 55 %
Platelet Count: 394 10*3/uL (ref 150–400)
RBC: 3.71 MIL/uL — ABNORMAL LOW (ref 4.22–5.81)
RDW: 17.7 % — ABNORMAL HIGH (ref 11.5–15.5)
WBC Count: 5.8 10*3/uL (ref 4.0–10.5)
nRBC: 0 % (ref 0.0–0.2)

## 2021-08-07 LAB — RETICULOCYTES
Immature Retic Fract: 29.7 % — ABNORMAL HIGH (ref 2.3–15.9)
RBC.: 3.8 MIL/uL — ABNORMAL LOW (ref 4.22–5.81)
Retic Count, Absolute: 58.5 10*3/uL (ref 19.0–186.0)
Retic Ct Pct: 1.5 % (ref 0.4–3.1)

## 2021-08-07 LAB — IMMATURE PLATELET FRACTION: Immature Platelet Fraction: 5.5 % (ref 1.2–8.6)

## 2021-08-08 ENCOUNTER — Telehealth: Payer: Self-pay | Admitting: Hematology

## 2021-08-08 ENCOUNTER — Inpatient Hospital Stay (HOSPITAL_BASED_OUTPATIENT_CLINIC_OR_DEPARTMENT_OTHER): Payer: Medicare PPO | Admitting: Hematology

## 2021-08-08 DIAGNOSIS — D45 Polycythemia vera: Secondary | ICD-10-CM | POA: Diagnosis not present

## 2021-08-08 NOTE — Telephone Encounter (Signed)
Scheduled follow-up appointments per 9/20 los. Patient is aware. 

## 2021-08-14 ENCOUNTER — Encounter: Payer: Self-pay | Admitting: Hematology

## 2021-08-14 NOTE — Progress Notes (Signed)
HEMATOLOGY/ONCOLOGY Phone visit NOTE  Date of Service: 08/14/2021  Patient Care Team: [name], MD as PCP - General (Family Medicine)  CHIEF COMPLAINTS/PURPOSE OF CONSULTATION:  F/u for polycythemia vera  HISTORY OF PRESENTING ILLNESS:  Dillon Hill is a wonderful 75 y.o. male with medical history significant for migraine headaches benign prostatic hypertrophy and hyperlipidemia who presented to the [facility] in [facility] with large hematoma to left arm.  He was noted to have thrombocytopenia.  Other concerns will be noted.  He has been transferred for further work-up here ED Course: In the emergency room patient had work-up including including which was concerning for possible dural venous sinus thrombosis and MRI with and without contrast and MRV to evaluate was slated to be done at [facility]. Patient had no significant symptoms of any kind.   INTERVAL HISTORY:   I connected with Dillon Hill on 08/08/2021 by telephone and verified that I am speaking with the correct person using two identifiers.   I discussed the limitations of evaluation and management by telemedicine. The patient expressed understanding and agreed to proceed.   Patient's location: Home Provider's location: [place] at [place] is a wonderful 75 y.o. male is here for the evaluation and management of polycythemia vera. The patient's last visit with [name] was on 07/112022. The pt reports that he is doing well overall.  The pt reports no new symptoms or concerns. He notes no issues with the Eliquis and is continuing to take his vitamins. He denies any medication changes. The patient notes some concern over dementia, as his mother developed this around when she was 9.  Lab results 9/11//2022 of CBC w/diff shows normal platelets of 394 k, WBC count of 5.8 k hemoglobin of 12.9 . and CMP stable with creatinine of 1.3  On review of systems, pt reports no other acute new symptoms  MEDICAL  HISTORY:  Past Medical History:  Diagnosis Date   BPH (benign prostatic hyperplasia)    Colon polyp    DDD (degenerative disc disease), cervical    High cholesterol    Migraine    Polycythemia vera ([place])     SURGICAL HISTORY: Past Surgical History:  Procedure Laterality Date   EYE SURGERY      SOCIAL HISTORY: Social History   Socioeconomic History   Marital status: Married    Spouse name: Not on file   Number of children: Not on file   Years of education: Not on file   Highest education level: Not on file  Occupational History   Not on file  Tobacco Use   Smoking status: Never   Smokeless tobacco: Never   Tobacco comments:    smoked x 1 year at age 74  Vaping Use   Vaping Use: Never used  Substance and Sexual Activity   Alcohol use: Yes    Comment: occ   Drug use: Never   Sexual activity: Not on file  Other Topics Concern   Not on file  Social History Narrative   Not on file   Social Determinants of Health   Financial Resource Strain: Not on file  Food Insecurity: Not on file  Transportation Needs: Not on file  Physical Activity: Not on file  Stress: Not on file  Social Connections: Not on file  Intimate Partner Violence: Not on file    FAMILY HISTORY: No family history on file.  ALLERGIES:  has No Known Allergies.  MEDICATIONS:  Current Outpatient Medications  Medication Sig Dispense Refill   amLODipine (NORVASC) 10 MG tablet Take 1 tablet (10 mg total) by mouth daily. 30 tablet 0   Cyanocobalamin (B-12) 1000 MCG SUBL Place 1,000 mcg under the tongue daily. 30 tablet 5   ELIQUIS 5 MG TABS tablet TAKE 1 TABLET(5 MG) BY MOUTH TWICE DAILY 60 tablet 2   folic acid (FOLVITE) 1 MG tablet TAKE 1 TABLET(1 MG) BY MOUTH DAILY 90 tablet 0   hydroxyurea (HYDREA) 500 MG capsule Take 1 capsule (500 mg total) by mouth once a week. Take 1 cap (500mg ) orally once weekly on Monday. May take with food to minimize GI side effects. 30 capsule 2   Multiple Vitamin  (MULTIVITAMIN WITH MINERALS) TABS tablet Take 1 tablet by mouth daily.     SUMAtriptan (IMITREX) 100 MG tablet Take 100 mg by mouth as needed for migraine or headache.   1   No current facility-administered medications for this visit.    REVIEW OF SYSTEMS:   10 Point review of Systems was done is negative except as noted above.  PHYSICAL EXAMINATION:  Telehealth Visit.  LABORATORY DATA:  I have reviewed the data as listed  . CBC Latest Ref Rng & Units 08/07/2021 05/29/2021 03/27/2021  WBC 4.0 - 10.5 K/uL 5.8 4.0 4.3  Hemoglobin 13.0 - 17.0 g/dL 11.9(L) 12.0(L) 11.9(L)  Hematocrit 39.0 - 52.0 % 35.8(L) 36.7(L) 37.4(L)  Platelets 150 - 400 K/uL 394 331 437(H)    . CMP Latest Ref Rng & Units 08/07/2021 05/29/2021 03/27/2021  Glucose 70 - 99 mg/dL 70 97 87  BUN 8 - 23 mg/dL 16 19 18   Creatinine 0.61 - 1.24 mg/dL 1.34(H) 1.31(H) 1.42(H)  Sodium 135 - 145 mmol/L 144 142 143  Potassium 3.5 - 5.1 mmol/L 3.7 4.0 4.5  Chloride 98 - 111 mmol/L 106 107 106  CO2 22 - 32 mmol/L 27 28 26   Calcium 8.9 - 10.3 mg/dL 9.7 9.4 9.4  Total Protein 6.5 - 8.1 g/dL 7.2 7.3 7.6  Total Bilirubin 0.3 - 1.2 mg/dL 0.5 0.5 0.6  Alkaline Phos 38 - 126 U/L 67 80 83  AST 15 - 41 U/L 18 18 23   ALT 0 - 44 U/L 11 8 11    01/19/2020 [id number] Flow Pathology (BM Bx):   01/19/2020 [id number] BM Bx Report:    10/24/2019 JAK2 mutation testing:    RADIOGRAPHIC STUDIES: I have personally reviewed the radiological images as listed and agreed with the findings in the report. No results found.  ASSESSMENT & PLAN:   #1 Severe thrombocytosis with platelets of 1005k in diagnosis 10/24/2019 JAK2 GenotypR is "POSITIVE for the detection of the V617F mutation" #2 leukocytosis/primary neutrophilia. #3 polycythemia with a hematocrit of 51.7. #4 Dural venous sinus thrombosis -likely triggered by his polycythemia and thrombocytosis. #5 left arm hematoma likely related to a bicipital tendon injury. #6 RBC  microcytosis likely from iron depletion from possible MPN   Patient's overall presentation is highly concerning for a newly diagnosed myeloproliferative neoplasm likely polycythemia vera or essential thrombocytosis.  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 10/24/2019 CALR Mutation analysis is "Negative"  10/24/2019 BCR ABL is "Negative"  01/19/2020 BM Bx ([id number]) revealed "BONE MARROW, ASPIRATE, CLOT, CORE:  -  Normocellular marrow with megakaryocytic hyperplasia and atypia  -  Documented JAK-2 (V617F) mutation. PERIPHERAL BLOOD: -  Pancytopenia." 01/19/2020 BM Bx Flow Pathology ([id number]) revealed " No monoclonal B-cell or phenotypically apparent T-cell, or distinct blast population identified."  PLAN:  -Discussed pt  labwork, 08/07/2021; plt remained normal, Hgb improved, WBC normal. Chemistries stable. -Advised pt that all counts are where we want them at now.  -continue Hydroxyurea 500mg  po  twice weekly -Recommended pt stay active. -Continue daily Vitamin V40, Folic acid, & B-complex  -Continue 5 mg Eliquis BID -Will see back in 10 weeks via phone. Labs day prior.   FOLLOW UP:  Phone Visit with Dr Irene Limbo in 10 weeks. Please schedule the labs the day prior to visit.    The total time spent in the appointment was 15 minutes and more than 50% was on counseling and direct patient cares.  All of the patient's questions were answered with apparent satisfaction. The patient knows to call the clinic with any problems, questions or concerns.    Sullivan Lone MD [place] AAHIVMS [facility] Hematology/Oncology Physician [facility]

## 2021-10-16 ENCOUNTER — Inpatient Hospital Stay: Payer: Medicare PPO | Attending: Hematology

## 2021-10-16 ENCOUNTER — Other Ambulatory Visit: Payer: Self-pay

## 2021-10-16 ENCOUNTER — Other Ambulatory Visit: Payer: Self-pay | Admitting: Physician Assistant

## 2021-10-16 DIAGNOSIS — D45 Polycythemia vera: Secondary | ICD-10-CM | POA: Diagnosis not present

## 2021-10-16 DIAGNOSIS — D72829 Elevated white blood cell count, unspecified: Secondary | ICD-10-CM | POA: Insufficient documentation

## 2021-10-16 DIAGNOSIS — Z7901 Long term (current) use of anticoagulants: Secondary | ICD-10-CM | POA: Insufficient documentation

## 2021-10-16 DIAGNOSIS — S40022A Contusion of left upper arm, initial encounter: Secondary | ICD-10-CM | POA: Diagnosis not present

## 2021-10-16 DIAGNOSIS — Z79899 Other long term (current) drug therapy: Secondary | ICD-10-CM | POA: Insufficient documentation

## 2021-10-16 DIAGNOSIS — D75839 Thrombocytosis, unspecified: Secondary | ICD-10-CM | POA: Diagnosis not present

## 2021-10-16 DIAGNOSIS — G08 Intracranial and intraspinal phlebitis and thrombophlebitis: Secondary | ICD-10-CM | POA: Diagnosis not present

## 2021-10-16 LAB — CMP (CANCER CENTER ONLY)
ALT: 10 U/L (ref 0–44)
AST: 22 U/L (ref 15–41)
Albumin: 4 g/dL (ref 3.5–5.0)
Alkaline Phosphatase: 78 U/L (ref 38–126)
Anion gap: 8 (ref 5–15)
BUN: 15 mg/dL (ref 8–23)
CO2: 27 mmol/L (ref 22–32)
Calcium: 9.2 mg/dL (ref 8.9–10.3)
Chloride: 108 mmol/L (ref 98–111)
Creatinine: 1.23 mg/dL (ref 0.61–1.24)
GFR, Estimated: >60 mL/min (ref >60)
Glucose, Bld: 95 mg/dL (ref 70–99)
Potassium: 4.3 mmol/L (ref 3.5–5.1)
Sodium: 143 mmol/L (ref 135–145)
Total Bilirubin: 0.6 mg/dL (ref 0.3–1.2)
Total Protein: 7.2 g/dL (ref 6.5–8.1)

## 2021-10-16 LAB — CBC WITH DIFFERENTIAL (CANCER CENTER ONLY)
Abs Immature Granulocytes: 0.09 10*3/uL — ABNORMAL HIGH (ref 0.00–0.07)
Basophils Absolute: 0.1 10*3/uL (ref 0.0–0.1)
Basophils Relative: 2 %
Eosinophils Absolute: 0.3 10*3/uL (ref 0.0–0.5)
Eosinophils Relative: 5 %
HCT: 37.2 % — ABNORMAL LOW (ref 39.0–52.0)
Hemoglobin: 12 g/dL — ABNORMAL LOW (ref 13.0–17.0)
Immature Granulocytes: 1 %
Lymphocytes Relative: 24 %
Lymphs Abs: 1.6 10*3/uL (ref 0.7–4.0)
MCH: 31.3 pg (ref 26.0–34.0)
MCHC: 32.3 g/dL (ref 30.0–36.0)
MCV: 96.9 fL (ref 80.0–100.0)
Monocytes Absolute: 0.8 10*3/uL (ref 0.1–1.0)
Monocytes Relative: 12 %
Neutro Abs: 3.7 10*3/uL (ref 1.7–7.7)
Neutrophils Relative %: 56 %
Platelet Count: 465 10*3/uL — ABNORMAL HIGH (ref 150–400)
RBC: 3.84 MIL/uL — ABNORMAL LOW (ref 4.22–5.81)
RDW: 18.6 % — ABNORMAL HIGH (ref 11.5–15.5)
WBC Count: 6.6 10*3/uL (ref 4.0–10.5)
nRBC: 0 % (ref 0.0–0.2)

## 2021-10-16 LAB — RETICULOCYTES
Immature Retic Fract: 33.5 % — ABNORMAL HIGH (ref 2.3–15.9)
RBC.: 3.86 MIL/uL — ABNORMAL LOW (ref 4.22–5.81)
Retic Count, Absolute: 66.8 10*3/uL (ref 19.0–186.0)
Retic Ct Pct: 1.7 % (ref 0.4–3.1)

## 2021-10-16 LAB — IMMATURE PLATELET FRACTION: Immature Platelet Fraction: 6 % (ref 1.2–8.6)

## 2021-10-16 NOTE — Progress Notes (Signed)
CBC w/Diff, CMP, Reticulocytes, and Immature platelets ordered per verbal orders given by Cassie Hellingoepter, PA.

## 2021-10-17 ENCOUNTER — Inpatient Hospital Stay (HOSPITAL_BASED_OUTPATIENT_CLINIC_OR_DEPARTMENT_OTHER): Payer: Medicare PPO | Admitting: Hematology

## 2021-10-17 DIAGNOSIS — S40022A Contusion of left upper arm, initial encounter: Secondary | ICD-10-CM | POA: Diagnosis not present

## 2021-10-17 DIAGNOSIS — G08 Intracranial and intraspinal phlebitis and thrombophlebitis: Secondary | ICD-10-CM | POA: Diagnosis not present

## 2021-10-17 DIAGNOSIS — D45 Polycythemia vera: Secondary | ICD-10-CM | POA: Diagnosis not present

## 2021-10-17 DIAGNOSIS — D75839 Thrombocytosis, unspecified: Secondary | ICD-10-CM | POA: Diagnosis not present

## 2021-10-17 DIAGNOSIS — D72829 Elevated white blood cell count, unspecified: Secondary | ICD-10-CM | POA: Diagnosis not present

## 2021-10-17 DIAGNOSIS — Z79899 Other long term (current) drug therapy: Secondary | ICD-10-CM | POA: Diagnosis not present

## 2021-10-17 DIAGNOSIS — Z7901 Long term (current) use of anticoagulants: Secondary | ICD-10-CM | POA: Diagnosis not present

## 2021-10-17 NOTE — Progress Notes (Signed)
HEMATOLOGY/ONCOLOGY Phone visit NOTE  Date of Service: 10/17/2021  Patient Care Team: [name], MD as PCP - General (Family Medicine)  CHIEF COMPLAINTS/PURPOSE OF CONSULTATION:  F/u for polycythemia vera  HISTORY OF PRESENTING ILLNESS:  Dillon Hill is a wonderful 75 y.o. male with medical history significant for migraine headaches benign prostatic hypertrophy and hyperlipidemia who presented to the [facility] in [facility] with large hematoma to left arm.  He was noted to have thrombocytopenia.  Other concerns will be noted.  He has been transferred for further work-up here ED Course: In the emergency room patient had work-up including including which was concerning for possible dural venous sinus thrombosis and MRI with and without contrast and MRV to evaluate was slated to be done at [facility] long. Patient had no significant symptoms of any kind.   INTERVAL HISTORY:   I connected with Dillon Hill on .10/17/2021  by telephone and verified that I am speaking with the correct person using two identifiers.   I discussed the limitations of evaluation and management by telemedicine. The patient expressed understanding and agreed to proceed.   Patient's location: Home Provider's location: [place] at [place] is a wonderful 75 y.o. male is here for the evaluation and management of polycythemia vera. The patient's last phone visit with [name] was about 10 weeks ago. The pt reports no new symptoms or concerns.  He notes improved energy levels.  He is taking his Eliquis regularly without any issues with bleeding. Report no toxicities from his Hydrea 5 mg twice weekly.  We discussed his lab results from 10/16/2021 with CBC showing stable WBC count of 6.6k, hemoglobin of 12 and platelets of 465k CMP stable creatinine 1.23. On review of systems patient notes no other acute new symptoms. He notes that he is up to speed with his COVID-19 booster vaccine and his flu shot this  year. He shall be confirming with his primary care physician if he has received the Shingrix vaccine  MEDICAL HISTORY:  Past Medical History:  Diagnosis Date   BPH (benign prostatic hyperplasia)    Colon polyp    DDD (degenerative disc disease), cervical    High cholesterol    Migraine    Polycythemia vera ([place])     SURGICAL HISTORY: Past Surgical History:  Procedure Laterality Date   EYE SURGERY      SOCIAL HISTORY: Social History   Socioeconomic History   Marital status: Married    Spouse name: Not on file   Number of children: Not on file   Years of education: Not on file   Highest education level: Not on file  Occupational History   Not on file  Tobacco Use   Smoking status: Never   Smokeless tobacco: Never   Tobacco comments:    smoked x 1 year at age 82  Vaping Use   Vaping Use: Never used  Substance and Sexual Activity   Alcohol use: Yes    Comment: occ   Drug use: Never   Sexual activity: Not on file  Other Topics Concern   Not on file  Social History Narrative   Not on file   Social Determinants of Health   Financial Resource Strain: Not on file  Food Insecurity: Not on file  Transportation Needs: Not on file  Physical Activity: Not on file  Stress: Not on file  Social Connections: Not on file  Intimate Partner Violence: Not on file    FAMILY HISTORY: No  family history on file.  ALLERGIES:  has No Known Allergies.  MEDICATIONS:  Current Outpatient Medications  Medication Sig Dispense Refill   amLODipine (NORVASC) 10 MG tablet Take 1 tablet (10 mg total) by mouth daily. 30 tablet 0   Cyanocobalamin (B-12) 1000 MCG SUBL Place 1,000 mcg under the tongue daily. 30 tablet 5   ELIQUIS 5 MG TABS tablet TAKE 1 TABLET(5 MG) BY MOUTH TWICE DAILY 60 tablet 2   folic acid (FOLVITE) 1 MG tablet TAKE 1 TABLET(1 MG) BY MOUTH DAILY 90 tablet 0   hydroxyurea (HYDREA) 500 MG capsule Take 1 capsule (500 mg total) by mouth once a week. Take 1 cap (500mg )  orally once weekly on Monday. May take with food to minimize GI side effects. 30 capsule 2   Multiple Vitamin (MULTIVITAMIN WITH MINERALS) TABS tablet Take 1 tablet by mouth daily.     SUMAtriptan (IMITREX) 100 MG tablet Take 100 mg by mouth as needed for migraine or headache.   1   No current facility-administered medications for this visit.    REVIEW OF SYSTEMS:   10 Point review of Systems was done is negative except as noted above.  PHYSICAL EXAMINATION:  Telehealth Visit.  LABORATORY DATA:  I have reviewed the data as listed  . CBC Latest Ref Rng & Units 10/16/2021 08/07/2021 05/29/2021  WBC 4.0 - 10.5 K/uL 6.6 5.8 4.0  Hemoglobin 13.0 - 17.0 g/dL 12.0(L) 11.9(L) 12.0(L)  Hematocrit 39.0 - 52.0 % 37.2(L) 35.8(L) 36.7(L)  Platelets 150 - 400 K/uL 465(H) 394 331    . CMP Latest Ref Rng & Units 10/16/2021 08/07/2021 05/29/2021  Glucose 70 - 99 mg/dL 95 70 97  BUN 8 - 23 mg/dL 15 16 19   Creatinine 0.61 - 1.24 mg/dL 1.23 1.34(H) 1.31(H)  Sodium 135 - 145 mmol/L 143 144 142  Potassium 3.5 - 5.1 mmol/L 4.3 3.7 4.0  Chloride 98 - 111 mmol/L 108 106 107  CO2 22 - 32 mmol/L 27 27 28   Calcium 8.9 - 10.3 mg/dL 9.2 9.7 9.4  Total Protein 6.5 - 8.1 g/dL 7.2 7.2 7.3  Total Bilirubin 0.3 - 1.2 mg/dL 0.6 0.5 0.5  Alkaline Phos 38 - 126 U/L 78 67 80  AST 15 - 41 U/L 22 18 18   ALT 0 - 44 U/L 10 11 8    01/19/2020 [id number] Flow Pathology (BM Bx):   01/19/2020 [id number] BM Bx Report:    10/24/2019 JAK2 mutation testing:    RADIOGRAPHIC STUDIES: I have personally reviewed the radiological images as listed and agreed with the findings in the report. No results found.  ASSESSMENT & PLAN:   #1 Severe thrombocytosis with platelets of 1005k in diagnosis 10/24/2019 JAK2 GenotypR is "POSITIVE for the detection of the V617F mutation" #2 leukocytosis/primary neutrophilia. #3 polycythemia with a hematocrit of 51.7. #4 Dural venous sinus thrombosis -likely triggered by his  polycythemia and thrombocytosis. #5 left arm hematoma likely related to a bicipital tendon injury. #6 RBC microcytosis likely from iron depletion from possible MPN   Patient's overall presentation is highly concerning for a newly diagnosed myeloproliferative neoplasm likely polycythemia vera or essential thrombocytosis.  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 10/24/2019 CALR Mutation analysis is "Negative"  10/24/2019 BCR ABL is "Negative"  01/19/2020 BM Bx ([id number]) revealed "BONE MARROW, ASPIRATE, CLOT, CORE:  -  Normocellular marrow with megakaryocytic hyperplasia and atypia  -  Documented JAK-2 (V617F) mutation. PERIPHERAL BLOOD: -  Pancytopenia." 01/19/2020 BM Bx Flow Pathology ([id number]) revealed "  No monoclonal B-cell or phenotypically apparent T-cell, or distinct blast population identified."  PLAN:  -Discussed pt labwork, lab results from 10/16/2021 with CBC showing stable WBC count of 6.6k, hemoglobin of 12 and platelets of 465k CMP stable creatinine 1.23. -No reported toxicities from his hydroxyurea at the current dose of 500 mg twice weekly. -Would recommend patient increase his hydroxyurea to 500 mg 3 times weekly Tuesday Thursday Saturday in light of his increasing platelets above goal.  His tolerated platelet levels are between 200-400k. -Recommended consuming at least 64 ounces of water daily -Recommended pt stay active. -Continue daily Vitamin K24, Folic acid, & B-complex  -Continue 5 mg Eliquis BID -Will see back in 10 weeks via phone. Labs day prior.   FOLLOW UP:  Phone Visit with Dr Irene Limbo in 10 weeks. Please schedule the labs the day prior to visit.  . The total time spent in the appointment was 15 minutes and more than 50% was on counseling and direct patient cares.   All of the patient's questions were answered with apparent satisfaction. The patient knows to call the clinic with any problems, questions or concerns.    Sullivan Lone MD [place] AAHIVMS [facility] Hematology/Oncology Physician [facility]

## 2021-10-18 ENCOUNTER — Telehealth: Payer: Self-pay | Admitting: Hematology

## 2021-10-18 NOTE — Telephone Encounter (Signed)
Scheduled follow-up appointments per 11/29 los. Patient is aware. 

## 2021-10-29 ENCOUNTER — Other Ambulatory Visit: Payer: Self-pay | Admitting: Hematology

## 2021-10-30 ENCOUNTER — Encounter: Payer: Self-pay | Admitting: Hematology

## 2021-12-22 ENCOUNTER — Other Ambulatory Visit: Payer: Self-pay

## 2021-12-22 DIAGNOSIS — D45 Polycythemia vera: Secondary | ICD-10-CM

## 2021-12-25 ENCOUNTER — Inpatient Hospital Stay: Payer: Medicare PPO | Attending: Hematology

## 2021-12-25 ENCOUNTER — Other Ambulatory Visit: Payer: Self-pay

## 2021-12-25 DIAGNOSIS — D45 Polycythemia vera: Secondary | ICD-10-CM | POA: Diagnosis not present

## 2021-12-25 LAB — CMP (CANCER CENTER ONLY)
ALT: 8 U/L (ref 0–44)
AST: 16 U/L (ref 15–41)
Albumin: 4 g/dL (ref 3.5–5.0)
Alkaline Phosphatase: 67 U/L (ref 38–126)
Anion gap: 5 (ref 5–15)
BUN: 23 mg/dL (ref 8–23)
CO2: 29 mmol/L (ref 22–32)
Calcium: 9.2 mg/dL (ref 8.9–10.3)
Chloride: 108 mmol/L (ref 98–111)
Creatinine: 1.27 mg/dL — ABNORMAL HIGH (ref 0.61–1.24)
GFR, Estimated: 59 mL/min — ABNORMAL LOW (ref >60)
Glucose, Bld: 92 mg/dL (ref 70–99)
Potassium: 4 mmol/L (ref 3.5–5.1)
Sodium: 142 mmol/L (ref 135–145)
Total Bilirubin: 0.5 mg/dL (ref 0.3–1.2)
Total Protein: 6.8 g/dL (ref 6.5–8.1)

## 2021-12-25 LAB — CBC WITH DIFFERENTIAL (CANCER CENTER ONLY)
Abs Immature Granulocytes: 0.04 10*3/uL (ref 0.00–0.07)
Basophils Absolute: 0.1 10*3/uL (ref 0.0–0.1)
Basophils Relative: 3 %
Eosinophils Absolute: 0.2 10*3/uL (ref 0.0–0.5)
Eosinophils Relative: 4 %
HCT: 34.4 % — ABNORMAL LOW (ref 39.0–52.0)
Hemoglobin: 11.4 g/dL — ABNORMAL LOW (ref 13.0–17.0)
Immature Granulocytes: 1 %
Lymphocytes Relative: 35 %
Lymphs Abs: 1.7 10*3/uL (ref 0.7–4.0)
MCH: 31.8 pg (ref 26.0–34.0)
MCHC: 33.1 g/dL (ref 30.0–36.0)
MCV: 96.1 fL (ref 80.0–100.0)
Monocytes Absolute: 0.5 10*3/uL (ref 0.1–1.0)
Monocytes Relative: 10 %
Neutro Abs: 2.3 10*3/uL (ref 1.7–7.7)
Neutrophils Relative %: 47 %
Platelet Count: 326 10*3/uL (ref 150–400)
RBC: 3.58 MIL/uL — ABNORMAL LOW (ref 4.22–5.81)
RDW: 18.9 % — ABNORMAL HIGH (ref 11.5–15.5)
WBC Count: 4.8 10*3/uL (ref 4.0–10.5)
nRBC: 0 % (ref 0.0–0.2)

## 2021-12-25 LAB — RETIC PANEL
Immature Retic Fract: 31.1 % — ABNORMAL HIGH (ref 2.3–15.9)
RBC.: 3.58 MIL/uL — ABNORMAL LOW (ref 4.22–5.81)
Retic Count, Absolute: 52.3 10*3/uL (ref 19.0–186.0)
Retic Ct Pct: 1.5 % (ref 0.4–3.1)
Reticulocyte Hemoglobin: 33.1 pg (ref >27.9)

## 2021-12-25 LAB — IMMATURE PLATELET FRACTION: Immature Platelet Fraction: 6.1 % (ref 1.2–8.6)

## 2021-12-26 ENCOUNTER — Inpatient Hospital Stay (HOSPITAL_BASED_OUTPATIENT_CLINIC_OR_DEPARTMENT_OTHER): Payer: Medicare PPO | Admitting: Hematology

## 2021-12-26 DIAGNOSIS — D45 Polycythemia vera: Secondary | ICD-10-CM

## 2021-12-27 ENCOUNTER — Telehealth: Payer: Self-pay | Admitting: Hematology

## 2021-12-27 NOTE — Telephone Encounter (Signed)
Scheduled follow-up appointments per 2/7 los. Patient is aware. °

## 2022-01-01 ENCOUNTER — Encounter: Payer: Self-pay | Admitting: Hematology

## 2022-01-01 NOTE — Progress Notes (Signed)
HEMATOLOGY/ONCOLOGY Phone visit NOTE  Date of Service:.12/26/2021   Patient Care Team: [name], MD as PCP - General (Family Medicine)  CHIEF COMPLAINTS/PURPOSE OF CONSULTATION:   Follow-up for continued evaluation and management of polycythemia vera  HISTORY OF PRESENTING ILLNESS:   Please see previous note for details on initial presentation  INTERVAL HISTORY:   I connected with [name] on 12/26/2021  by telephone and verified that I am speaking with the correct person using two identifiers.   I discussed the limitations of evaluation and management by telemedicine. The patient expressed understanding and agreed to proceed.   Patient's location: Home Provider's location: [place] [facility] [place] was called for follow-up for continued management of her polycythemia vera.  Labs done on 12/25/2021 reviewed with the patient. No new leg swelling. No issues with tolerating his current dose of hydroxyurea.   MEDICAL HISTORY:  Past Medical History:  Diagnosis Date   BPH (benign prostatic hyperplasia)    Colon polyp    DDD (degenerative disc disease), cervical    High cholesterol    Migraine    Polycythemia vera ([place])     SURGICAL HISTORY: Past Surgical History:  Procedure Laterality Date   EYE SURGERY      SOCIAL HISTORY: Social History   Socioeconomic History   Marital status: Married    Spouse name: Not on file   Number of children: Not on file   Years of education: Not on file   Highest education level: Not on file  Occupational History   Not on file  Tobacco Use   Smoking status: Never   Smokeless tobacco: Never   Tobacco comments:    smoked x 1 year at age 60  Vaping Use   Vaping Use: Never used  Substance and Sexual Activity   Alcohol use: Yes    Comment: occ   Drug use: Never   Sexual activity: Not on file  Other Topics Concern   Not on file  Social History Narrative   Not on file   Social Determinants of Health    Financial Resource Strain: Not on file  Food Insecurity: Not on file  Transportation Needs: Not on file  Physical Activity: Not on file  Stress: Not on file  Social Connections: Not on file  Intimate Partner Violence: Not on file    FAMILY HISTORY: No family history on file.  ALLERGIES:  has No Known Allergies.  MEDICATIONS:  Current Outpatient Medications  Medication Sig Dispense Refill   amLODipine (NORVASC) 10 MG tablet Take 1 tablet (10 mg total) by mouth daily. 30 tablet 0   Cyanocobalamin (B-12) 1000 MCG SUBL Place 1,000 mcg under the tongue daily. 30 tablet 5   ELIQUIS 5 MG TABS tablet TAKE 1 TABLET(5 MG) BY MOUTH TWICE DAILY 60 tablet 2   folic acid (FOLVITE) 1 MG tablet TAKE 1 TABLET(1 MG) BY MOUTH DAILY 90 tablet 0   hydroxyurea (HYDREA) 500 MG capsule Take 1 capsule (500 mg total) by mouth once a week. Take 1 cap (500mg ) orally once weekly on Monday. May take with food to minimize GI side effects. 30 capsule 2   Multiple Vitamin (MULTIVITAMIN WITH MINERALS) TABS tablet Take 1 tablet by mouth daily.     SUMAtriptan (IMITREX) 100 MG tablet Take 100 mg by mouth as needed for migraine or headache.   1   No current facility-administered medications for this visit.    REVIEW OF SYSTEMS:   10 Point review  of Systems was done is negative except as noted above.  PHYSICAL EXAMINATION:  Telehealth Visit.  LABORATORY DATA:  I have reviewed the data as listed  . CBC Latest Ref Rng & Units 12/25/2021 10/16/2021 08/07/2021  WBC 4.0 - 10.5 K/uL 4.8 6.6 5.8  Hemoglobin 13.0 - 17.0 g/dL 11.4(L) 12.0(L) 11.9(L)  Hematocrit 39.0 - 52.0 % 34.4(L) 37.2(L) 35.8(L)  Platelets 150 - 400 K/uL 326 465(H) 394    . CMP Latest Ref Rng & Units 12/25/2021 10/16/2021 08/07/2021  Glucose 70 - 99 mg/dL 92 95 70  BUN 8 - 23 mg/dL 23 15 16   Creatinine 0.61 - 1.24 mg/dL 1.27(H) 1.23 1.34(H)  Sodium 135 - 145 mmol/L 142 143 144  Potassium 3.5 - 5.1 mmol/L 4.0 4.3 3.7  Chloride 98 - 111 mmol/L  108 108 106  CO2 22 - 32 mmol/L 29 27 27   Calcium 8.9 - 10.3 mg/dL 9.2 9.2 9.7  Total Protein 6.5 - 8.1 g/dL 6.8 7.2 7.2  Total Bilirubin 0.3 - 1.2 mg/dL 0.5 0.6 0.5  Alkaline Phos 38 - 126 U/L 67 78 67  AST 15 - 41 U/L 16 22 18   ALT 0 - 44 U/L 8 10 11    01/19/2020 [id number] Flow Pathology (BM Bx):   01/19/2020 [id number] BM Bx Report:    10/24/2019 JAK2 mutation testing:    RADIOGRAPHIC STUDIES: I have personally reviewed the radiological images as listed and agreed with the findings in the report. No results found.  ASSESSMENT & PLAN:   #1 Severe thrombocytosis with platelets of 1005k in diagnosis 10/24/2019 JAK2 GenotypR is "POSITIVE for the detection of the V617F mutation" #2 leukocytosis/primary neutrophilia. #3 polycythemia with a hematocrit of 51.7. #4 Dural venous sinus thrombosis -likely triggered by his polycythemia and thrombocytosis. #5 left arm hematoma likely related to a bicipital tendon injury. #6 RBC microcytosis likely from iron depletion from possible MPN   Patient's overall presentation is highly concerning for a newly diagnosed myeloproliferative neoplasm likely polycythemia vera or essential thrombocytosis.  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 10/24/2019 CALR Mutation analysis is "Negative"  10/24/2019 BCR ABL is "Negative"  01/19/2020 BM Bx ([id number]) revealed "BONE MARROW, ASPIRATE, CLOT, CORE:  -  Normocellular marrow with megakaryocytic hyperplasia and atypia  -  Documented JAK-2 (V617F) mutation. PERIPHERAL BLOOD: -  Pancytopenia." 01/19/2020 BM Bx Flow Pathology ([id number]) revealed " No monoclonal B-cell or phenotypically apparent T-cell, or distinct blast population identified."  PLAN:  -Patient's labs from 12/25/2021 were discussed in details Hemoglobin 11.4 with a WBC count of 4.8k and platelet count of 326k. CMP stable creatinine 1.27 Patient notes no prohibitive toxicities from his current dose of  hydroxyurea -We will continue hydroxyurea 500 mg 3 times weekly on Tuesday Thursday and Saturday. -Platelets are currently at goal -Continue daily Vitamin J18, Folic acid, & B-complex  -Continue 5 mg Eliquis BID  FOLLOW UP:  Phone Visit with Dr Irene Limbo in 12 weeks. Please schedule the labs the day prior to visit  Total time spent on phone visit 12 minutes  All of the patient's questions were answered with apparent satisfaction. The patient knows to call the clinic with any problems, questions or concerns.    Sullivan Lone MD [place] AAHIVMS [facility] Hematology/Oncology Physician [facility]

## 2022-02-28 ENCOUNTER — Other Ambulatory Visit: Payer: Self-pay | Admitting: Hematology

## 2022-02-28 DIAGNOSIS — D75839 Thrombocytosis, unspecified: Secondary | ICD-10-CM

## 2022-03-16 ENCOUNTER — Other Ambulatory Visit: Payer: Self-pay

## 2022-03-16 DIAGNOSIS — D45 Polycythemia vera: Secondary | ICD-10-CM

## 2022-03-19 ENCOUNTER — Inpatient Hospital Stay: Payer: Medicare PPO | Attending: Hematology

## 2022-03-19 ENCOUNTER — Other Ambulatory Visit: Payer: Self-pay

## 2022-03-19 DIAGNOSIS — D45 Polycythemia vera: Secondary | ICD-10-CM | POA: Insufficient documentation

## 2022-03-19 LAB — CBC WITH DIFFERENTIAL (CANCER CENTER ONLY)
Abs Immature Granulocytes: 0.03 10*3/uL (ref 0.00–0.07)
Basophils Absolute: 0.1 10*3/uL (ref 0.0–0.1)
Basophils Relative: 2 %
Eosinophils Absolute: 0.3 10*3/uL (ref 0.0–0.5)
Eosinophils Relative: 5 %
HCT: 36 % — ABNORMAL LOW (ref 39.0–52.0)
Hemoglobin: 11.9 g/dL — ABNORMAL LOW (ref 13.0–17.0)
Immature Granulocytes: 1 %
Lymphocytes Relative: 31 %
Lymphs Abs: 1.6 10*3/uL (ref 0.7–4.0)
MCH: 32.3 pg (ref 26.0–34.0)
MCHC: 33.1 g/dL (ref 30.0–36.0)
MCV: 97.8 fL (ref 80.0–100.0)
Monocytes Absolute: 0.6 10*3/uL (ref 0.1–1.0)
Monocytes Relative: 12 %
Neutro Abs: 2.5 10*3/uL (ref 1.7–7.7)
Neutrophils Relative %: 49 %
Platelet Count: 314 10*3/uL (ref 150–400)
RBC: 3.68 MIL/uL — ABNORMAL LOW (ref 4.22–5.81)
RDW: 18.6 % — ABNORMAL HIGH (ref 11.5–15.5)
WBC Count: 5.1 10*3/uL (ref 4.0–10.5)
nRBC: 0 % (ref 0.0–0.2)

## 2022-03-19 LAB — CMP (CANCER CENTER ONLY)
ALT: 9 U/L (ref 0–44)
AST: 18 U/L (ref 15–41)
Albumin: 4.1 g/dL (ref 3.5–5.0)
Alkaline Phosphatase: 67 U/L (ref 38–126)
Anion gap: 6 (ref 5–15)
BUN: 21 mg/dL (ref 8–23)
CO2: 30 mmol/L (ref 22–32)
Calcium: 9.4 mg/dL (ref 8.9–10.3)
Chloride: 105 mmol/L (ref 98–111)
Creatinine: 1.33 mg/dL — ABNORMAL HIGH (ref 0.61–1.24)
GFR, Estimated: 56 mL/min — ABNORMAL LOW (ref >60)
Glucose, Bld: 108 mg/dL — ABNORMAL HIGH (ref 70–99)
Potassium: 4.2 mmol/L (ref 3.5–5.1)
Sodium: 141 mmol/L (ref 135–145)
Total Bilirubin: 0.5 mg/dL (ref 0.3–1.2)
Total Protein: 7 g/dL (ref 6.5–8.1)

## 2022-03-19 LAB — RETICULOCYTES
Immature Retic Fract: 32.2 % — ABNORMAL HIGH (ref 2.3–15.9)
RBC.: 3.67 MIL/uL — ABNORMAL LOW (ref 4.22–5.81)
Retic Count, Absolute: 58.4 10*3/uL (ref 19.0–186.0)
Retic Ct Pct: 1.6 % (ref 0.4–3.1)

## 2022-03-19 LAB — IMMATURE PLATELET FRACTION: Immature Platelet Fraction: 6.8 % (ref 1.2–8.6)

## 2022-03-20 ENCOUNTER — Inpatient Hospital Stay (HOSPITAL_BASED_OUTPATIENT_CLINIC_OR_DEPARTMENT_OTHER): Payer: Medicare PPO | Admitting: Hematology

## 2022-03-20 DIAGNOSIS — D45 Polycythemia vera: Secondary | ICD-10-CM | POA: Diagnosis not present

## 2022-03-20 DIAGNOSIS — D75839 Thrombocytosis, unspecified: Secondary | ICD-10-CM

## 2022-03-20 MED ORDER — HYDROXYUREA 500 MG PO CAPS: 500.0000 mg | ORAL_CAPSULE | ORAL | 2 refills | Status: DC

## 2022-03-21 ENCOUNTER — Telehealth: Payer: Self-pay | Admitting: Hematology

## 2022-03-21 NOTE — Telephone Encounter (Signed)
Scheduled follow-up appointments per 5/2 los. Patient is aware. ?

## 2022-03-23 ENCOUNTER — Encounter: Payer: Self-pay | Admitting: Hematology

## 2022-03-23 NOTE — Progress Notes (Signed)
? ? ?HEMATOLOGY/ONCOLOGY Phone visit NOTE ? ?Date of Service:.03/20/2022 ? ? ?Patient Care Team: ?[name], MD as PCP - General (Family Medicine) ? ?CHIEF COMPLAINTS/PURPOSE OF CONSULTATION:  ?Follow-up for continued evaluation and management of polycythemia vera. ? ?HISTORY OF PRESENTING ILLNESS:  ? ?Please see previous note for details on initial presentation ? ?INTERVAL HISTORY:  ? ?I connected with Dillon Hill on 03/20/2022  by telephone and verified that I am speaking with the correct person using two identifiers. ?  ?I discussed the limitations of evaluation and management by telemedicine. The patient expressed understanding and agreed to proceed. ?  ?Patient's location: Home ?Provider's location: [place] at [facility] ? ?Dillon Hill was called to continue follow-up of his polycythemia vera and discuss his recent lab results from yesterday. ?He notes no acute new symptoms since his last clinic visit.  No new medications. ?Good p.o. intake.  Weight has been stable. ?Labs from 03/19/2022 were discussed with him in detail. ?He has been staying active playing golf. ? ?MEDICAL HISTORY:  ?Past Medical History:  ?Diagnosis Date  ? BPH (benign prostatic hyperplasia)   ? Colon polyp   ? DDD (degenerative disc disease), cervical   ? High cholesterol   ? Migraine   ? Polycythemia vera ([place])   ? ? ?SURGICAL HISTORY: ?Past Surgical History:  ?Procedure Laterality Date  ? EYE SURGERY    ? ? ?SOCIAL HISTORY: ?Social History  ? ?Socioeconomic History  ? Marital status: Widowed  ?  Spouse name: Not on file  ? Number of children: Not on file  ? Years of education: Not on file  ? Highest education level: Not on file  ?Occupational History  ? Not on file  ?Tobacco Use  ? Smoking status: Never  ? Smokeless tobacco: Never  ? Tobacco comments:  ?  smoked x 1 year at age 24  ?Vaping Use  ? Vaping Use: Never used  ?Substance and Sexual Activity  ? Alcohol use: Yes  ?  Comment: occ  ? Drug use: Never  ? Sexual activity: Not on file   ?Other Topics Concern  ? Not on file  ?Social History Narrative  ? Not on file  ? ?Social Determinants of Health  ? ?Financial Resource Strain: Not on file  ?Food Insecurity: Not on file  ?Transportation Needs: Not on file  ?Physical Activity: Not on file  ?Stress: Not on file  ?Social Connections: Not on file  ?Intimate Partner Violence: Not on file  ? ? ?FAMILY HISTORY: ?No family history on file. ? ?ALLERGIES:  has No Known Allergies. ? ?MEDICATIONS:  ?Current Outpatient Medications  ?Medication Sig Dispense Refill  ? amLODipine (NORVASC) 10 MG tablet Take 1 tablet (10 mg total) by mouth daily. 30 tablet 0  ? Cyanocobalamin (B-12) 1000 MCG SUBL Place 1,000 mcg under the tongue daily. 30 tablet 5  ? ELIQUIS 5 MG TABS tablet TAKE 1 TABLET(5 MG) BY MOUTH TWICE DAILY 60 tablet 2  ? folic acid (FOLVITE) 1 MG tablet TAKE 1 TABLET(1 MG) BY MOUTH DAILY 90 tablet 0  ? hydroxyurea (HYDREA) 500 MG capsule Take 1 capsule (500 mg total) by mouth 3 (three) times a week. May take with food to minimize GI side effects. 30 capsule 2  ? Multiple Vitamin (MULTIVITAMIN WITH MINERALS) TABS tablet Take 1 tablet by mouth daily.    ? ?No current facility-administered medications for this visit.  ? ? ?REVIEW OF SYSTEMS:   ?10 Point review of Systems was done is negative except  as noted above. ? ?PHYSICAL EXAMINATION: ? ?Telehealth Visit. ? ?LABORATORY DATA:  ?I have reviewed the data as listed ? ?. ? ?  Latest Ref Rng & Units 03/19/2022  ?  7:28 AM 12/25/2021  ?  7:36 AM 10/16/2021  ?  8:22 AM  ?CBC  ?WBC 4.0 - 10.5 K/uL 5.1   4.8   6.6    ?Hemoglobin 13.0 - 17.0 g/dL 11.9   11.4   12.0    ?Hematocrit 39.0 - 52.0 % 36.0   34.4   37.2    ?Platelets 150 - 400 K/uL 314   326   465    ? ? ?. ? ?  Latest Ref Rng & Units 03/19/2022  ?  7:28 AM 12/25/2021  ?  7:36 AM 10/16/2021  ?  8:22 AM  ?CMP  ?Glucose 70 - 99 mg/dL 108   92   95    ?BUN 8 - 23 mg/dL '21   23   15    '$ ?Creatinine 0.61 - 1.24 mg/dL 1.33   1.27   1.23    ?Sodium 135 - 145 mmol/L 141    142   143    ?Potassium 3.5 - 5.1 mmol/L 4.2   4.0   4.3    ?Chloride 98 - 111 mmol/L 105   108   108    ?CO2 22 - 32 mmol/L '30   29   27    '$ ?Calcium 8.9 - 10.3 mg/dL 9.4   9.2   9.2    ?Total Protein 6.5 - 8.1 g/dL 7.0   6.8   7.2    ?Total Bilirubin 0.3 - 1.2 mg/dL 0.5   0.5   0.6    ?Alkaline Phos 38 - 126 U/L 67   67   78    ?AST 15 - 41 U/L '18   16   22    '$ ?ALT 0 - 44 U/L '9   8   10    '$ ? ?         ?01/19/2020 [id number] Flow Pathology (BM Bx): ? ? ?01/19/2020 [id number] BM Bx Report:  ? ? ?10/24/2019 JAK2 mutation testing:  ? ? ?RADIOGRAPHIC STUDIES: ?I have personally reviewed the radiological images as listed and agreed with the findings in the report. ?No results found. ? ?ASSESSMENT & PLAN:  ? ?#1  JAK2 positive polycythemia vera  ?severe thrombocytosis with platelets of 1005k in diagnosis ?10/24/2019 JAK2 GenotypR is "POSITIVE for the detection of the V617F mutation" ? ?#2  History of Dural venous sinus thrombosis -likely triggered by his polycythemia and thrombocytosis.  Evaluation ?  ?01/19/2020 BM Bx ([id number]) revealed "BONE MARROW, ASPIRATE, CLOT, CORE:  -  Normocellular marrow with megakaryocytic hyperplasia and atypia  -  Documented JAK-2 (V617F) mutation. PERIPHERAL BLOOD: -  Pancytopenia." ? ?PLAN:  ?-Labs from 03/20/2022 were discussed in detail with the patient . ?-CBC shows stable hemoglobin of 11.9 with a hematocrit of 36 normal WBC count of 5.1 k and platelets are at goal at 314 k ?-CMP stable ?-Patient is tolerating his current dose of hydroxyurea without any notable toxicities. ?-We will continue his current dose of hydroxyurea at 500 mg 3 times weekly on Tuesday Thursday and Saturday. ?-Continue Eliquis 5 mg p.o. twice daily. ?-Continue vitamin B complex 1 capsule and folic acid 1 mg p.o. daily ? ?FOLLOW UP:  ?Phone Visit with Dr Irene Limbo in 12 weeks. Please schedule the labs the day prior to visit ? ?The total time spent  in the appointment was 10 minutes*. ? ?All of the patient's  questions were answered with apparent satisfaction. The patient knows to call the clinic with any problems, questions or concerns. ? ? ?Sullivan Lone MD [place] AAHIVMS [facility] ?Hematology/Oncology Physician ?[place] ? ?.*Total Encounter Time as defined by the Centers for Medicare and Medicaid Services includes, in addition to the face-to-face time of a patient visit (documented in the note above) non-face-to-face time: obtaining and reviewing outside history, ordering and reviewing medications, tests or procedures, care coordination (communications with other health care professionals or caregivers) and documentation in the medical record. ? ? ?

## 2022-05-29 ENCOUNTER — Other Ambulatory Visit: Payer: Self-pay | Admitting: Hematology

## 2022-06-08 ENCOUNTER — Other Ambulatory Visit: Payer: Self-pay

## 2022-06-08 DIAGNOSIS — D45 Polycythemia vera: Secondary | ICD-10-CM

## 2022-06-11 ENCOUNTER — Inpatient Hospital Stay: Payer: Medicare PPO | Attending: Hematology

## 2022-06-11 ENCOUNTER — Other Ambulatory Visit: Payer: Self-pay

## 2022-06-11 DIAGNOSIS — Z7901 Long term (current) use of anticoagulants: Secondary | ICD-10-CM | POA: Insufficient documentation

## 2022-06-11 DIAGNOSIS — D61818 Other pancytopenia: Secondary | ICD-10-CM | POA: Insufficient documentation

## 2022-06-11 DIAGNOSIS — Z86718 Personal history of other venous thrombosis and embolism: Secondary | ICD-10-CM | POA: Diagnosis not present

## 2022-06-11 DIAGNOSIS — D45 Polycythemia vera: Secondary | ICD-10-CM | POA: Diagnosis not present

## 2022-06-11 LAB — CBC WITH DIFFERENTIAL (CANCER CENTER ONLY)
Abs Immature Granulocytes: 0.05 10*3/uL (ref 0.00–0.07)
Basophils Absolute: 0.1 10*3/uL (ref 0.0–0.1)
Basophils Relative: 2 %
Eosinophils Absolute: 0.2 10*3/uL (ref 0.0–0.5)
Eosinophils Relative: 3 %
HCT: 35 % — ABNORMAL LOW (ref 39.0–52.0)
Hemoglobin: 11.7 g/dL — ABNORMAL LOW (ref 13.0–17.0)
Immature Granulocytes: 1 %
Lymphocytes Relative: 28 %
Lymphs Abs: 1.5 10*3/uL (ref 0.7–4.0)
MCH: 33.3 pg (ref 26.0–34.0)
MCHC: 33.4 g/dL (ref 30.0–36.0)
MCV: 99.7 fL (ref 80.0–100.0)
Monocytes Absolute: 0.7 10*3/uL (ref 0.1–1.0)
Monocytes Relative: 12 %
Neutro Abs: 3 10*3/uL (ref 1.7–7.7)
Neutrophils Relative %: 54 %
Platelet Count: 311 10*3/uL (ref 150–400)
RBC: 3.51 MIL/uL — ABNORMAL LOW (ref 4.22–5.81)
RDW: 18.2 % — ABNORMAL HIGH (ref 11.5–15.5)
WBC Count: 5.5 10*3/uL (ref 4.0–10.5)
nRBC: 0 % (ref 0.0–0.2)

## 2022-06-11 LAB — CMP (CANCER CENTER ONLY)
ALT: 8 U/L (ref 0–44)
AST: 16 U/L (ref 15–41)
Albumin: 4.4 g/dL (ref 3.5–5.0)
Alkaline Phosphatase: 63 U/L (ref 38–126)
Anion gap: 4 — ABNORMAL LOW (ref 5–15)
BUN: 20 mg/dL (ref 8–23)
CO2: 31 mmol/L (ref 22–32)
Calcium: 9.4 mg/dL (ref 8.9–10.3)
Chloride: 104 mmol/L (ref 98–111)
Creatinine: 1.33 mg/dL — ABNORMAL HIGH (ref 0.61–1.24)
GFR, Estimated: 56 mL/min — ABNORMAL LOW (ref >60)
Glucose, Bld: 96 mg/dL (ref 70–99)
Potassium: 4.5 mmol/L (ref 3.5–5.1)
Sodium: 139 mmol/L (ref 135–145)
Total Bilirubin: 0.6 mg/dL (ref 0.3–1.2)
Total Protein: 7.3 g/dL (ref 6.5–8.1)

## 2022-06-11 LAB — IMMATURE PLATELET FRACTION: Immature Platelet Fraction: 8.7 % — ABNORMAL HIGH (ref 1.2–8.6)

## 2022-06-11 LAB — RETICULOCYTES
Immature Retic Fract: 30.5 % — ABNORMAL HIGH (ref 2.3–15.9)
RBC.: 3.42 MIL/uL — ABNORMAL LOW (ref 4.22–5.81)
Retic Count, Absolute: 48.9 10*3/uL (ref 19.0–186.0)
Retic Ct Pct: 1.4 % (ref 0.4–3.1)

## 2022-06-12 ENCOUNTER — Inpatient Hospital Stay (HOSPITAL_BASED_OUTPATIENT_CLINIC_OR_DEPARTMENT_OTHER): Payer: Medicare PPO | Admitting: Hematology

## 2022-06-12 DIAGNOSIS — D61818 Other pancytopenia: Secondary | ICD-10-CM | POA: Diagnosis not present

## 2022-06-12 DIAGNOSIS — Z86718 Personal history of other venous thrombosis and embolism: Secondary | ICD-10-CM | POA: Diagnosis not present

## 2022-06-12 DIAGNOSIS — D45 Polycythemia vera: Secondary | ICD-10-CM | POA: Diagnosis not present

## 2022-06-12 DIAGNOSIS — Z7901 Long term (current) use of anticoagulants: Secondary | ICD-10-CM | POA: Diagnosis not present

## 2022-06-15 ENCOUNTER — Telehealth: Payer: Self-pay | Admitting: Hematology

## 2022-06-15 DIAGNOSIS — H6121 Impacted cerumen, right ear: Secondary | ICD-10-CM | POA: Diagnosis not present

## 2022-06-15 DIAGNOSIS — L989 Disorder of the skin and subcutaneous tissue, unspecified: Secondary | ICD-10-CM | POA: Diagnosis not present

## 2022-06-15 DIAGNOSIS — R42 Dizziness and giddiness: Secondary | ICD-10-CM | POA: Diagnosis not present

## 2022-06-15 DIAGNOSIS — I10 Essential (primary) hypertension: Secondary | ICD-10-CM | POA: Diagnosis not present

## 2022-06-15 NOTE — Telephone Encounter (Signed)
Left message with follow-up appointments per 7/25 los. 

## 2022-06-18 ENCOUNTER — Encounter: Payer: Self-pay | Admitting: Hematology

## 2022-06-18 NOTE — Progress Notes (Signed)
HEMATOLOGY/ONCOLOGY Phone visit NOTE  Date of Service:.06/12/2022   Patient Care Team: [name], MD as PCP - General (Family Medicine)  CHIEF COMPLAINTS/PURPOSE OF CONSULTATION:  F/u for continued mx of Polycythemia vera  HISTORY OF PRESENTING ILLNESS:   Please see previous note for details on initial presentation  INTERVAL HISTORY:   I connected with Dillon Hill on 06/12/2022  by telephone and verified that I am speaking with the correct person using two identifiers.   I discussed the limitations of evaluation and management by telemedicine. The patient expressed understanding and agreed to proceed.   Patient's location: Home Provider's location: [place] at [place] was called for discussion of his labs and for continued valuation and management of his polycythemia vera. He notes no acute new symptoms since his last clinic visit.  He has been taking his hydroxyurea regularly as per instructions.  No new headaches.  No new leg pain or swelling.  No new chest pain or shortness of breath. No bleeding issues. Labs done today were discussed in detail with the patient.[name]  MEDICAL HISTORY:  Past Medical History:  Diagnosis Date   BPH (benign prostatic hyperplasia)    Colon polyp    DDD (degenerative disc disease), cervical    High cholesterol    Migraine    Polycythemia vera ([place])     SURGICAL HISTORY: Past Surgical History:  Procedure Laterality Date   EYE SURGERY      SOCIAL HISTORY: Social History   Socioeconomic History   Marital status: Widowed    Spouse name: Not on file   Number of children: Not on file   Years of education: Not on file   Highest education level: Not on file  Occupational History   Not on file  Tobacco Use   Smoking status: Never   Smokeless tobacco: Never   Tobacco comments:    smoked x 1 year at age 2  Vaping Use   Vaping Use: Never used  Substance and Sexual Activity   Alcohol use: Yes    Comment: occ    Drug use: Never   Sexual activity: Not on file  Other Topics Concern   Not on file  Social History Narrative   Not on file   Social Determinants of Health   Financial Resource Strain: Not on file  Food Insecurity: Not on file  Transportation Needs: Not on file  Physical Activity: Not on file  Stress: Not on file  Social Connections: Not on file  Intimate Partner Violence: Not on file    FAMILY HISTORY: No family history on file.  ALLERGIES:  has No Known Allergies.  MEDICATIONS:  Current Outpatient Medications  Medication Sig Dispense Refill   amLODipine (NORVASC) 10 MG tablet Take 1 tablet (10 mg total) by mouth daily. 30 tablet 0   Cyanocobalamin (B-12) 1000 MCG SUBL Place 1,000 mcg under the tongue daily. 30 tablet 5   ELIQUIS 5 MG TABS tablet TAKE 1 TABLET(5 MG) BY MOUTH TWICE DAILY 60 tablet 2   folic acid (FOLVITE) 1 MG tablet TAKE 1 TABLET(1 MG) BY MOUTH DAILY 90 tablet 0   hydroxyurea (HYDREA) 500 MG capsule Take 1 capsule (500 mg total) by mouth 3 (three) times a week. May take with food to minimize GI side effects. 30 capsule 2   Multiple Vitamin (MULTIVITAMIN WITH MINERALS) TABS tablet Take 1 tablet by mouth daily.     No current facility-administered medications for this visit.    REVIEW  OF SYSTEMS:   10 Point review of Systems was done is negative except as noted above. PHYSICAL EXAMINATION:  Telehealth Visit.  LABORATORY DATA:  I have reviewed the data as listed  .    Latest Ref Rng & Units 06/11/2022    3:20 PM 03/19/2022    7:28 AM 12/25/2021    7:36 AM  CBC  WBC 4.0 - 10.5 K/uL 5.5  5.1  4.8   Hemoglobin 13.0 - 17.0 g/dL 11.7  11.9  11.4   Hematocrit 39.0 - 52.0 % 35.0  36.0  34.4   Platelets 150 - 400 K/uL 311  314  326     .    Latest Ref Rng & Units 06/11/2022    3:20 PM 03/19/2022    7:28 AM 12/25/2021    7:36 AM  CMP  Glucose 70 - 99 mg/dL 96  108  92   BUN 8 - 23 mg/dL '20  21  23   '$ Creatinine 0.61 - 1.24 mg/dL 1.33  1.33  1.27   Sodium  135 - 145 mmol/L 139  141  142   Potassium 3.5 - 5.1 mmol/L 4.5  4.2  4.0   Chloride 98 - 111 mmol/L 104  105  108   CO2 22 - 32 mmol/L '31  30  29   '$ Calcium 8.9 - 10.3 mg/dL 9.4  9.4  9.2   Total Protein 6.5 - 8.1 g/dL 7.3  7.0  6.8   Total Bilirubin 0.3 - 1.2 mg/dL 0.6  0.5  0.5   Alkaline Phos 38 - 126 U/L 63  67  67   AST 15 - 41 U/L '16  18  16   '$ ALT 0 - 44 U/L '8  9  8             '$ 01/19/2020 [id number] Flow Pathology (BM Bx):   01/19/2020 [id number] BM Bx Report:    10/24/2019 JAK2 mutation testing:    RADIOGRAPHIC STUDIES: I have personally reviewed the radiological images as listed and agreed with the findings in the report. No results found.  ASSESSMENT & PLAN:   #1  JAK2 positive polycythemia vera  severe thrombocytosis with platelets of 1005k in diagnosis 10/24/2019 JAK2 GenotypR is "POSITIVE for the detection of the V617F mutation"  #2  History of Dural venous sinus thrombosis -likely triggered by his polycythemia and thrombocytosis.  Evaluation   01/19/2020 BM Bx ([id number]) revealed "BONE MARROW, ASPIRATE, CLOT, CORE:  -  Normocellular marrow with megakaryocytic hyperplasia and atypia  -  Documented JAK-2 (V617F) mutation. PERIPHERAL BLOOD: -  Pancytopenia."  PLAN:  -Labs done on 06/11/2022 were reviewed in detail with the patient CBC shows stable hemoglobin of 11.7 with normal WBC count of platelet count of 311k CMP stable -Patient reports no toxicities from his current dose of hydroxyurea. His blood counts are stable and we shall continue his current dose of hydroxyurea at 500 mg 3 times weekly on Tuesday Thursday and Saturday. He shall continue his Eliquis at 5 mg p.o. twice daily Continue vitamin B complex 1 capsule p.o. daily.  FOLLOW UP:  Phone Visit with Dr Irene Limbo in 12 weeks. Please schedule the labs the day prior to visit  The total time spent in the appointment was 11 minutes*.  All of the patient's questions were answered with apparent  satisfaction. The patient knows to call the clinic with any problems, questions or concerns.   Sullivan Lone MD [place] AAHIVMS [facility] Hematology/Oncology Physician [facility]  .*Total Encounter Time as defined by the Centers for Medicare and Medicaid Services includes, in addition to the face-to-face time of a patient visit (documented in the note above) non-face-to-face time: obtaining and reviewing outside history, ordering and reviewing medications, tests or procedures, care coordination (communications with other health care professionals or caregivers) and documentation in the medical record.

## 2022-07-25 DIAGNOSIS — E7800 Pure hypercholesterolemia, unspecified: Secondary | ICD-10-CM | POA: Diagnosis not present

## 2022-07-25 DIAGNOSIS — Z23 Encounter for immunization: Secondary | ICD-10-CM | POA: Diagnosis not present

## 2022-07-25 DIAGNOSIS — D471 Chronic myeloproliferative disease: Secondary | ICD-10-CM | POA: Diagnosis not present

## 2022-07-25 DIAGNOSIS — N400 Enlarged prostate without lower urinary tract symptoms: Secondary | ICD-10-CM | POA: Diagnosis not present

## 2022-07-25 DIAGNOSIS — Z125 Encounter for screening for malignant neoplasm of prostate: Secondary | ICD-10-CM | POA: Diagnosis not present

## 2022-07-25 DIAGNOSIS — Z0000 Encounter for general adult medical examination without abnormal findings: Secondary | ICD-10-CM | POA: Diagnosis not present

## 2022-07-25 DIAGNOSIS — G3184 Mild cognitive impairment, so stated: Secondary | ICD-10-CM | POA: Diagnosis not present

## 2022-08-31 ENCOUNTER — Other Ambulatory Visit: Payer: Self-pay | Admitting: *Deleted

## 2022-08-31 DIAGNOSIS — D45 Polycythemia vera: Secondary | ICD-10-CM

## 2022-09-02 ENCOUNTER — Other Ambulatory Visit: Payer: Self-pay | Admitting: Hematology

## 2022-09-03 ENCOUNTER — Encounter: Payer: Self-pay | Admitting: Hematology

## 2022-09-03 ENCOUNTER — Inpatient Hospital Stay: Payer: Medicare PPO | Attending: Hematology

## 2022-09-03 DIAGNOSIS — D45 Polycythemia vera: Secondary | ICD-10-CM | POA: Diagnosis not present

## 2022-09-03 LAB — RETICULOCYTES
Immature Retic Fract: 29.1 % — ABNORMAL HIGH (ref 2.3–15.9)
RBC.: 3.51 MIL/uL — ABNORMAL LOW (ref 4.22–5.81)
Retic Count, Absolute: 41.4 10*3/uL (ref 19.0–186.0)
Retic Ct Pct: 1.2 % (ref 0.4–3.1)

## 2022-09-03 LAB — CMP (CANCER CENTER ONLY)
ALT: 11 U/L (ref 0–44)
AST: 23 U/L (ref 15–41)
Albumin: 4.5 g/dL (ref 3.5–5.0)
Alkaline Phosphatase: 75 U/L (ref 38–126)
Anion gap: 6 (ref 5–15)
BUN: 18 mg/dL (ref 8–23)
CO2: 29 mmol/L (ref 22–32)
Calcium: 9.6 mg/dL (ref 8.9–10.3)
Chloride: 104 mmol/L (ref 98–111)
Creatinine: 1.21 mg/dL (ref 0.61–1.24)
GFR, Estimated: >60 mL/min (ref >60)
Glucose, Bld: 100 mg/dL — ABNORMAL HIGH (ref 70–99)
Potassium: 4 mmol/L (ref 3.5–5.1)
Sodium: 139 mmol/L (ref 135–145)
Total Bilirubin: 0.7 mg/dL (ref 0.3–1.2)
Total Protein: 7.6 g/dL (ref 6.5–8.1)

## 2022-09-03 LAB — CBC WITH DIFFERENTIAL (CANCER CENTER ONLY)
Abs Immature Granulocytes: 0.05 10*3/uL (ref 0.00–0.07)
Basophils Absolute: 0.1 10*3/uL (ref 0.0–0.1)
Basophils Relative: 1 %
Eosinophils Absolute: 0.2 10*3/uL (ref 0.0–0.5)
Eosinophils Relative: 4 %
HCT: 35 % — ABNORMAL LOW (ref 39.0–52.0)
Hemoglobin: 11.7 g/dL — ABNORMAL LOW (ref 13.0–17.0)
Immature Granulocytes: 1 %
Lymphocytes Relative: 22 %
Lymphs Abs: 1.2 10*3/uL (ref 0.7–4.0)
MCH: 33.1 pg (ref 26.0–34.0)
MCHC: 33.4 g/dL (ref 30.0–36.0)
MCV: 99.2 fL (ref 80.0–100.0)
Monocytes Absolute: 0.6 10*3/uL (ref 0.1–1.0)
Monocytes Relative: 11 %
Neutro Abs: 3.5 10*3/uL (ref 1.7–7.7)
Neutrophils Relative %: 61 %
Platelet Count: 235 10*3/uL (ref 150–400)
RBC: 3.53 MIL/uL — ABNORMAL LOW (ref 4.22–5.81)
RDW: 17.7 % — ABNORMAL HIGH (ref 11.5–15.5)
WBC Count: 5.7 10*3/uL (ref 4.0–10.5)
nRBC: 0 % (ref 0.0–0.2)

## 2022-09-03 LAB — IMMATURE PLATELET FRACTION: Immature Platelet Fraction: 10.6 % — ABNORMAL HIGH (ref 1.2–8.6)

## 2022-09-04 ENCOUNTER — Inpatient Hospital Stay (HOSPITAL_BASED_OUTPATIENT_CLINIC_OR_DEPARTMENT_OTHER): Payer: Medicare PPO | Admitting: Hematology

## 2022-09-04 DIAGNOSIS — D45 Polycythemia vera: Secondary | ICD-10-CM

## 2022-09-04 NOTE — Progress Notes (Signed)
HEMATOLOGY/ONCOLOGY PHONE VISIT NOTE  Date of Service: 09/04/2022   Patient Care Team: [name], MD as PCP - General (Family Medicine)  CHIEF COMPLAINTS/PURPOSE OF CONSULTATION:  F/u for continued mx of Polycythemia vera  HISTORY OF PRESENTING ILLNESS:   Please see previous note for details on initial presentation  INTERVAL HISTORY:  I connected with Dillon Hill on 09/04/2022 by telephone and verified that I am speaking with the correct person using two identifiers.   I discussed the limitations of evaluation and management by telemedicine. The patient expressed understanding and agreed to proceed.   Patient's location: Home Provider's location: [place] [facility] was called for discussion of his labs and for continued valuation and management of his polycythemia vera. He reports He is doing well with no new symptoms or concerns.  He has been taking his hydroxyurea regularly as per instructions.    No new headaches.   No new leg pain or swelling.   No new chest pain or shortness of breath. No bleeding issues. No other new or acute focal symptoms.  Labs done today were discussed in detail with the patient.  MEDICAL HISTORY:  Past Medical History:  Diagnosis Date   BPH (benign prostatic hyperplasia)    Colon polyp    DDD (degenerative disc disease), cervical    High cholesterol    Migraine    Polycythemia vera ([place])     SURGICAL HISTORY: Past Surgical History:  Procedure Laterality Date   EYE SURGERY      SOCIAL HISTORY: Social History   Socioeconomic History   Marital status: Widowed    Spouse name: Not on file   Number of children: Not on file   Years of education: Not on file   Highest education level: Not on file  Occupational History   Not on file  Tobacco Use   Smoking status: Never   Smokeless tobacco: Never   Tobacco comments:    smoked x 1 year at age 29  Vaping Use   Vaping Use: Never used  Substance and Sexual  Activity   Alcohol use: Yes    Comment: occ   Drug use: Never   Sexual activity: Not on file  Other Topics Concern   Not on file  Social History Narrative   Not on file   Social Determinants of Health   Financial Resource Strain: Not on file  Food Insecurity: Not on file  Transportation Needs: Not on file  Physical Activity: Not on file  Stress: Not on file  Social Connections: Not on file  Intimate Partner Violence: Not on file    FAMILY HISTORY: No family history on file.  ALLERGIES:  has No Known Allergies.  MEDICATIONS:  Current Outpatient Medications  Medication Sig Dispense Refill   amLODipine (NORVASC) 10 MG tablet Take 1 tablet (10 mg total) by mouth daily. 30 tablet 0   Cyanocobalamin (B-12) 1000 MCG SUBL Place 1,000 mcg under the tongue daily. 30 tablet 5   ELIQUIS 5 MG TABS tablet TAKE 1 TABLET(5 MG) BY MOUTH TWICE DAILY 60 tablet 2   folic acid (FOLVITE) 1 MG tablet TAKE 1 TABLET(1 MG) BY MOUTH DAILY 90 tablet 0   hydroxyurea (HYDREA) 500 MG capsule Take 1 capsule (500 mg total) by mouth 3 (three) times a week. May take with food to minimize GI side effects. 30 capsule 2   Multiple Vitamin (MULTIVITAMIN WITH MINERALS) TABS tablet Take 1 tablet by mouth daily.  No current facility-administered medications for this visit.    REVIEW OF SYSTEMS:   10 Point review of Systems was done is negative except as noted above. PHYSICAL EXAMINATION: Telehealth Visit.  LABORATORY DATA:  I have reviewed the data as listed  .    Latest Ref Rng & Units 09/03/2022    3:16 PM 06/11/2022    3:20 PM 03/19/2022    7:28 AM  CBC  WBC 4.0 - 10.5 K/uL 5.7  5.5  5.1   Hemoglobin 13.0 - 17.0 g/dL 11.7  11.7  11.9   Hematocrit 39.0 - 52.0 % 35.0  35.0  36.0   Platelets 150 - 400 K/uL 235  311  314     .    Latest Ref Rng & Units 09/03/2022    3:16 PM 06/11/2022    3:20 PM 03/19/2022    7:28 AM  CMP  Glucose 70 - 99 mg/dL 100  96  108   BUN 8 - 23 mg/dL '18  20  21    '$ Creatinine 0.61 - 1.24 mg/dL 1.21  1.33  1.33   Sodium 135 - 145 mmol/L 139  139  141   Potassium 3.5 - 5.1 mmol/L 4.0  4.5  4.2   Chloride 98 - 111 mmol/L 104  104  105   CO2 22 - 32 mmol/L '29  31  30   '$ Calcium 8.9 - 10.3 mg/dL 9.6  9.4  9.4   Total Protein 6.5 - 8.1 g/dL 7.6  7.3  7.0   Total Bilirubin 0.3 - 1.2 mg/dL 0.7  0.6  0.5   Alkaline Phos 38 - 126 U/L 75  63  67   AST 15 - 41 U/L '23  16  18   '$ ALT 0 - 44 U/L '11  8  9             '$ 01/19/2020 [id number] Flow Pathology (BM Bx):   01/19/2020 [id number] BM Bx Report:    10/24/2019 JAK2 mutation testing:    RADIOGRAPHIC STUDIES: I have personally reviewed the radiological images as listed and agreed with the findings in the report. No results found.  ASSESSMENT & PLAN:   #1  JAK2 positive polycythemia vera  severe thrombocytosis with platelets of 1005k in diagnosis 10/24/2019 JAK2 GenotypR is "POSITIVE for the detection of the V617F mutation"  #2  History of Dural venous sinus thrombosis -likely triggered by his polycythemia and thrombocytosis.  Evaluation   01/19/2020 BM Bx ([id number]) revealed "BONE MARROW, ASPIRATE, CLOT, CORE:  -  Normocellular marrow with megakaryocytic hyperplasia and atypia  -  Documented JAK-2 (V617F) mutation. PERIPHERAL BLOOD: -  Pancytopenia."  PLAN:  -Labs done on 09/03/2022 were reviewed in detail with the patient CBC shows stable hemoglobin of 11.7 with normal WBC count of platelet count of 235k CMP stable -Patient reports no toxicities from his current dose of hydroxyurea. His blood counts are stable and we shall continue his current dose of hydroxyurea at 500 mg 3 times weekly on Tuesday Thursday and Saturday. He shall continue his Eliquis at 5 mg p.o. twice daily Continue vitamin B complex 1 capsule p.o. daily.  FOLLOW UP:  Phone Visit with Dr Irene Limbo in 16 weeks.  Please schedule the labs the day prior to visit  The total time spent in the appointment was 20  minutes*.  All of the patient's questions were answered with apparent satisfaction. The patient knows to call the clinic with any problems, questions or concerns.   Airi Copado  Irene Limbo MD MS AAHIVMS [facility] Hematology/Oncology Physician [facility]  .*Total Encounter Time as defined by the Centers for Medicare and Medicaid Services includes, in addition to the face-to-face time of a patient visit (documented in the note above) non-face-to-face time: obtaining and reviewing outside history, ordering and reviewing medications, tests or procedures, care coordination (communications with other health care professionals or caregivers) and documentation in the medical record.  I, Melene Muller, am acting as scribe for Dr. Sullivan Lone, MD. .I have reviewed the above documentation for accuracy and completeness, and I agree with the above. Brunetta Genera MD

## 2022-09-10 ENCOUNTER — Encounter: Payer: Self-pay | Admitting: Hematology

## 2022-10-02 ENCOUNTER — Other Ambulatory Visit: Payer: Self-pay | Admitting: Hematology

## 2022-10-02 DIAGNOSIS — L570 Actinic keratosis: Secondary | ICD-10-CM | POA: Diagnosis not present

## 2022-10-02 DIAGNOSIS — C44319 Basal cell carcinoma of skin of other parts of face: Secondary | ICD-10-CM | POA: Diagnosis not present

## 2022-10-02 DIAGNOSIS — D485 Neoplasm of uncertain behavior of skin: Secondary | ICD-10-CM | POA: Diagnosis not present

## 2022-10-02 DIAGNOSIS — D75839 Thrombocytosis, unspecified: Secondary | ICD-10-CM

## 2022-10-18 DIAGNOSIS — C44319 Basal cell carcinoma of skin of other parts of face: Secondary | ICD-10-CM | POA: Diagnosis not present

## 2022-12-28 ENCOUNTER — Other Ambulatory Visit: Payer: Self-pay | Admitting: Hematology

## 2023-01-04 ENCOUNTER — Other Ambulatory Visit: Payer: Self-pay | Admitting: Hematology

## 2023-01-04 ENCOUNTER — Inpatient Hospital Stay: Payer: Medicare PPO | Attending: Hematology

## 2023-01-04 DIAGNOSIS — D45 Polycythemia vera: Secondary | ICD-10-CM

## 2023-01-04 LAB — CBC WITH DIFFERENTIAL (CANCER CENTER ONLY)
Abs Immature Granulocytes: 0.18 10*3/uL — ABNORMAL HIGH (ref 0.00–0.07)
Basophils Absolute: 0.1 10*3/uL (ref 0.0–0.1)
Basophils Relative: 1 %
Eosinophils Absolute: 1.1 10*3/uL — ABNORMAL HIGH (ref 0.0–0.5)
Eosinophils Relative: 11 %
HCT: 32.7 % — ABNORMAL LOW (ref 39.0–52.0)
Hemoglobin: 10.8 g/dL — ABNORMAL LOW (ref 13.0–17.0)
Immature Granulocytes: 2 %
Lymphocytes Relative: 20 %
Lymphs Abs: 2 10*3/uL (ref 0.7–4.0)
MCH: 32.8 pg (ref 26.0–34.0)
MCHC: 33 g/dL (ref 30.0–36.0)
MCV: 99.4 fL (ref 80.0–100.0)
Monocytes Absolute: 0.8 10*3/uL (ref 0.1–1.0)
Monocytes Relative: 8 %
Neutro Abs: 5.6 10*3/uL (ref 1.7–7.7)
Neutrophils Relative %: 58 %
Platelet Count: 154 10*3/uL (ref 150–400)
RBC: 3.29 MIL/uL — ABNORMAL LOW (ref 4.22–5.81)
RDW: 20.1 % — ABNORMAL HIGH (ref 11.5–15.5)
WBC Count: 9.7 10*3/uL (ref 4.0–10.5)
nRBC: 0.8 % — ABNORMAL HIGH (ref 0.0–0.2)

## 2023-01-04 LAB — CMP (CANCER CENTER ONLY)
ALT: 12 U/L (ref 0–44)
AST: 22 U/L (ref 15–41)
Albumin: 4.4 g/dL (ref 3.5–5.0)
Alkaline Phosphatase: 126 U/L (ref 38–126)
Anion gap: 7 (ref 5–15)
BUN: 26 mg/dL — ABNORMAL HIGH (ref 8–23)
CO2: 28 mmol/L (ref 22–32)
Calcium: 9.6 mg/dL (ref 8.9–10.3)
Chloride: 107 mmol/L (ref 98–111)
Creatinine: 1.31 mg/dL — ABNORMAL HIGH (ref 0.61–1.24)
GFR, Estimated: 57 mL/min — ABNORMAL LOW (ref >60)
Glucose, Bld: 95 mg/dL (ref 70–99)
Potassium: 4.4 mmol/L (ref 3.5–5.1)
Sodium: 142 mmol/L (ref 135–145)
Total Bilirubin: 0.6 mg/dL (ref 0.3–1.2)
Total Protein: 7.8 g/dL (ref 6.5–8.1)

## 2023-01-04 LAB — RETIC PANEL
Immature Retic Fract: 30.2 % — ABNORMAL HIGH (ref 2.3–15.9)
RBC.: 3.29 MIL/uL — ABNORMAL LOW (ref 4.22–5.81)
Retic Count, Absolute: 36.8 10*3/uL (ref 19.0–186.0)
Retic Ct Pct: 1.1 % (ref 0.4–3.1)
Reticulocyte Hemoglobin: 34.3 pg (ref >27.9)

## 2023-01-04 LAB — LACTATE DEHYDROGENASE: LDH: 296 U/L — ABNORMAL HIGH (ref 98–192)

## 2023-01-07 ENCOUNTER — Inpatient Hospital Stay (HOSPITAL_BASED_OUTPATIENT_CLINIC_OR_DEPARTMENT_OTHER): Payer: Medicare PPO | Admitting: Hematology

## 2023-01-07 ENCOUNTER — Other Ambulatory Visit: Payer: Self-pay | Admitting: Hematology

## 2023-01-07 DIAGNOSIS — D45 Polycythemia vera: Secondary | ICD-10-CM | POA: Diagnosis not present

## 2023-01-07 MED ORDER — APIXABAN 5 MG PO TABS: ORAL_TABLET | ORAL | 2 refills | Status: DC

## 2023-01-07 NOTE — Progress Notes (Signed)
HEMATOLOGY/ONCOLOGY PHONE VISIT NOTE  Date of Service: 01/07/2023   Patient Care Team: [name], MD as PCP - General (Family Medicine)  CHIEF COMPLAINTS/PURPOSE OF CONSULTATION:  F/u for continued mx of Polycythemia vera  HISTORY OF PRESENTING ILLNESS:   Please see previous note for details on initial presentation  INTERVAL HISTORY:  .I connected with [name] on 01/07/2023 at  8:40 AM EST by telephone visit and verified that I am speaking with the correct person using two identifiers.   I discussed the limitations, risks, security and privacy concerns of performing an evaluation and management service by telemedicine and the availability of in-person appointments. I also discussed with the patient that there may be a patient responsible charge related to this service. The patient expressed understanding and agreed to proceed.   Other persons participating in the visit and their role in the encounter: None   Patient's location: Home  Provider's location: [facility]   Chief Complaint: F/u for continued mx of Polycythemia vera  I had a phone visit with the patient on 09/04/2022 and he was doing well overall.   Patient reports he has been doing well without any new medical concerns since our last visit. He denies headaches, new infection issues, leg pain, leg swelling, chest pain, shortness of breath, abnormal bleeding issues, or back pain. He does complain of memory problems and mild cognitive problems. He describes memory problems as not being able to remember people's name. He denies stress and depression, but does report mild insomnia. Patient reports that he randomly losses consciousness and passes out. He notes that when this happens, he feels light headed.   Patient is not taking any blood pressure medications.   He has been regularly taking hydroxyurea as prescribed, multi-vitamins, and Eliquis.   Patient notes that he is going to [place] in March and needs refill on  Eliquis.   He has received his influenza vaccine and COVID-19 Booster, but denies RSV vaccine.   Labs done today were discussed in detail with the patient.  MEDICAL HISTORY:  Past Medical History:  Diagnosis Date   BPH (benign prostatic hyperplasia)    Colon polyp    DDD (degenerative disc disease), cervical    High cholesterol    Migraine    Polycythemia vera ([place])     SURGICAL HISTORY: Past Surgical History:  Procedure Laterality Date   EYE SURGERY      SOCIAL HISTORY: Social History   Socioeconomic History   Marital status: Widowed    Spouse name: Not on file   Number of children: Not on file   Years of education: Not on file   Highest education level: Not on file  Occupational History   Not on file  Tobacco Use   Smoking status: Never   Smokeless tobacco: Never   Tobacco comments:    smoked x 1 year at age 78  Vaping Use   Vaping Use: Never used  Substance and Sexual Activity   Alcohol use: Yes    Comment: occ   Drug use: Never   Sexual activity: Not on file  Other Topics Concern   Not on file  Social History Narrative   Not on file   Social Determinants of Health   Financial Resource Strain: Not on file  Food Insecurity: Not on file  Transportation Needs: Not on file  Physical Activity: Not on file  Stress: Not on file  Social Connections: Not on file  Intimate Partner Violence: Not on file  FAMILY HISTORY: No family history on file.  ALLERGIES:  has No Known Allergies.  MEDICATIONS:  Current Outpatient Medications  Medication Sig Dispense Refill   amLODipine (NORVASC) 10 MG tablet Take 1 tablet (10 mg total) by mouth daily. 30 tablet 0   Cyanocobalamin (B-12) 1000 MCG SUBL Place 1,000 mcg under the tongue daily. 30 tablet 5   ELIQUIS 5 MG TABS tablet TAKE 1 TABLET(5 MG) BY MOUTH TWICE DAILY 60 tablet 2   folic acid (FOLVITE) 1 MG tablet TAKE 1 TABLET(1 MG) BY MOUTH DAILY 90 tablet 0   hydroxyurea (HYDREA) 500 MG capsule TAKE 1 CAPSULE  BY MOUTH ONCE WEEKLY ON MONDAYS. MAY TAKE WITH FOOD TO MINIMIZE GI SIDE EFFECTS 30 capsule 2   Multiple Vitamin (MULTIVITAMIN WITH MINERALS) TABS tablet Take 1 tablet by mouth daily.     No current facility-administered medications for this visit.    REVIEW OF SYSTEMS:   10 Point review of Systems was done is negative except as noted above. PHYSICAL EXAMINATION: Telehealth Visit.  LABORATORY DATA:  I have reviewed the data as listed  .    Latest Ref Rng & Units 01/04/2023    8:53 AM 09/03/2022    3:16 PM 06/11/2022    3:20 PM  CBC  WBC 4.0 - 10.5 K/uL 9.7  5.7  5.5   Hemoglobin 13.0 - 17.0 g/dL 10.8  11.7  11.7   Hematocrit 39.0 - 52.0 % 32.7  35.0  35.0   Platelets 150 - 400 K/uL 154  235  311     .    Latest Ref Rng & Units 01/04/2023    8:53 AM 09/03/2022    3:16 PM 06/11/2022    3:20 PM  CMP  Glucose 70 - 99 mg/dL 95  100  96   BUN 8 - 23 mg/dL '26  18  20   '$ Creatinine 0.61 - 1.24 mg/dL 1.31  1.21  1.33   Sodium 135 - 145 mmol/L 142  139  139   Potassium 3.5 - 5.1 mmol/L 4.4  4.0  4.5   Chloride 98 - 111 mmol/L 107  104  104   CO2 22 - 32 mmol/L '28  29  31   '$ Calcium 8.9 - 10.3 mg/dL 9.6  9.6  9.4   Total Protein 6.5 - 8.1 g/dL 7.8  7.6  7.3   Total Bilirubin 0.3 - 1.2 mg/dL 0.6  0.7  0.6   Alkaline Phos 38 - 126 U/L 126  75  63   AST 15 - 41 U/L '22  23  16   '$ ALT 0 - 44 U/L '12  11  8             '$ 01/19/2020 [id number] Flow Pathology (BM Bx):   01/19/2020 [id number] BM Bx Report:    10/24/2019 JAK2 mutation testing:    RADIOGRAPHIC STUDIES: I have personally reviewed the radiological images as listed and agreed with the findings in the report. No results found.  ASSESSMENT & PLAN:   #1  JAK2 positive polycythemia vera  severe thrombocytosis with platelets of 1005k in diagnosis 10/24/2019 JAK2 GenotypR is "POSITIVE for the detection of the V617F mutation"  #2  History of Dural venous sinus thrombosis -likely triggered by his polycythemia and  thrombocytosis.  Evaluation   01/19/2020 BM Bx ([id number]) revealed "BONE MARROW, ASPIRATE, CLOT, CORE:  -  Normocellular marrow with megakaryocytic hyperplasia and atypia  -  Documented JAK-2 (V617F) mutation. PERIPHERAL BLOOD: -  Pancytopenia."  PLAN:  -Patient reports no toxicities from his current dose of hydroxyurea. -Discussed lab results from 01/04/2023 with the patient. CBC showed decreased hemoglobin of 10.8 and hematocrit of 32.7. CMP shows patient is slightly dehydrated. LDH was slightly elevated.  -We will switch Hydroxyurea 500 mg 2 times weekly instead of 3 times. -Answered all of patient's questions. -Recommended RSV vaccine.  -Recommended to follow-up with PCP regarding memory problems and episodes of dizziness.  -Recommended to drink at least 2 L of water.  -Continue his Eliquis at 5 mg p.o. twice daily -Continue vitamin B complex 1 capsule p.o. daily.  FOLLOW-UP: Phone Visit with Dr Irene Limbo in 2 months Please schedule the labs the day prior to visit   The total time spent in the appointment was 20 minutes* .  All of the patient's questions were answered with apparent satisfaction. The patient knows to call the clinic with any problems, questions or concerns.   Sullivan Lone MD MS AAHIVMS [facility] Hematology/Oncology Physician [facility] [address]  .*Total Encounter Time as defined by the Centers for Medicare and Medicaid Services includes, in addition to the face-to-face time of a patient visit (documented in the note above) non-face-to-face time: obtaining and reviewing outside history, ordering and reviewing medications, tests or procedures, care coordination (communications with other health care professionals or caregivers) and documentation in the medical record.   I, Cleda Mccreedy, am acting as a Education administrator for Sullivan Lone, MD. .I have reviewed the above documentation for accuracy and completeness, and I agree with the above. Brunetta Genera MD

## 2023-01-08 ENCOUNTER — Telehealth: Payer: Self-pay | Admitting: Hematology

## 2023-01-08 NOTE — Telephone Encounter (Signed)
Called patient per 2/19 los notes to schedule f/u. Patient scheduled and notified.

## 2023-01-13 ENCOUNTER — Encounter: Payer: Self-pay | Admitting: Hematology

## 2023-03-01 ENCOUNTER — Other Ambulatory Visit: Payer: Self-pay

## 2023-03-01 DIAGNOSIS — D75839 Thrombocytosis, unspecified: Secondary | ICD-10-CM

## 2023-03-04 ENCOUNTER — Inpatient Hospital Stay: Payer: Medicare PPO | Attending: Hematology

## 2023-03-04 DIAGNOSIS — D45 Polycythemia vera: Secondary | ICD-10-CM | POA: Insufficient documentation

## 2023-03-04 DIAGNOSIS — D75839 Thrombocytosis, unspecified: Secondary | ICD-10-CM

## 2023-03-04 LAB — CMP (CANCER CENTER ONLY)
ALT: 11 U/L (ref 0–44)
AST: 25 U/L (ref 15–41)
Albumin: 4 g/dL (ref 3.5–5.0)
Alkaline Phosphatase: 156 U/L — ABNORMAL HIGH (ref 38–126)
Anion gap: 8 (ref 5–15)
BUN: 25 mg/dL — ABNORMAL HIGH (ref 8–23)
CO2: 26 mmol/L (ref 22–32)
Calcium: 9.7 mg/dL (ref 8.9–10.3)
Chloride: 105 mmol/L (ref 98–111)
Creatinine: 1.42 mg/dL — ABNORMAL HIGH (ref 0.61–1.24)
GFR, Estimated: 52 mL/min — ABNORMAL LOW (ref >60)
Glucose, Bld: 96 mg/dL (ref 70–99)
Potassium: 3.9 mmol/L (ref 3.5–5.1)
Sodium: 139 mmol/L (ref 135–145)
Total Bilirubin: 0.9 mg/dL (ref 0.3–1.2)
Total Protein: 7.8 g/dL (ref 6.5–8.1)

## 2023-03-04 LAB — CBC WITH DIFFERENTIAL (CANCER CENTER ONLY)
Abs Immature Granulocytes: 0.23 10*3/uL — ABNORMAL HIGH (ref 0.00–0.07)
Band Neutrophils: 0 %
Basophils Absolute: 0 10*3/uL (ref 0.0–0.1)
Basophils Relative: 0 %
Blasts: 5 %
Eosinophils Absolute: 1.2 10*3/uL — ABNORMAL HIGH (ref 0.0–0.5)
Eosinophils Relative: 10 %
HCT: 23.1 % — ABNORMAL LOW (ref 39.0–52.0)
Hemoglobin: 7.5 g/dL — ABNORMAL LOW (ref 13.0–17.0)
Lymphocytes Relative: 30 %
Lymphs Abs: 3.5 10*3/uL (ref 0.7–4.0)
MCH: 32.2 pg (ref 26.0–34.0)
MCHC: 32.5 g/dL (ref 30.0–36.0)
MCV: 99.1 fL (ref 80.0–100.0)
Metamyelocytes Relative: 0 %
Monocytes Absolute: 0.2 10*3/uL (ref 0.1–1.0)
Monocytes Relative: 2 %
Myelocytes: 2 %
Neutro Abs: 5.9 10*3/uL (ref 1.7–7.7)
Neutrophils Relative %: 51 %
Platelet Count: 129 10*3/uL — ABNORMAL LOW (ref 150–400)
RBC: 2.33 MIL/uL — ABNORMAL LOW (ref 4.22–5.81)
RDW: 21.5 % — ABNORMAL HIGH (ref 11.5–15.5)
WBC Count: 11.6 10*3/uL — ABNORMAL HIGH (ref 4.0–10.5)
nRBC: 11 /100 WBC — ABNORMAL HIGH
nRBC: 9.9 % — ABNORMAL HIGH (ref 0.0–0.2)

## 2023-03-04 LAB — RETICULOCYTES
Immature Retic Fract: 42 % — ABNORMAL HIGH (ref 2.3–15.9)
RBC.: 2.33 MIL/uL — ABNORMAL LOW (ref 4.22–5.81)
Retic Count, Absolute: 39.1 10*3/uL (ref 19.0–186.0)
Retic Ct Pct: 1.7 % (ref 0.4–3.1)

## 2023-03-04 LAB — PATHOLOGIST SMEAR REVIEW

## 2023-03-04 LAB — LACTATE DEHYDROGENASE: LDH: 518 U/L — ABNORMAL HIGH (ref 98–192)

## 2023-03-04 NOTE — Progress Notes (Signed)
HEMATOLOGY/ONCOLOGY PHONE VISIT NOTE  Date of Service: 03/05/2023   Patient Care Team: [name], MD as PCP - General (Family Medicine)  CHIEF COMPLAINTS/PURPOSE OF CONSULTATION:  F/u for continued mx of Polycythemia vera  HISTORY OF PRESENTING ILLNESS:   Please see previous note for details on initial presentation  INTERVAL HISTORY:  I connected with Dillon Hill on 03/05/23 at  8:40 AM EDT by telephone visit and verified that I am speaking with the correct person using two identifiers.   I discussed the limitations, risks, security and privacy concerns of performing an evaluation and management service by telemedicine and the availability of in-person appointments. I also discussed with the patient that there may be a patient responsible charge related to this service. The patient expressed understanding and agreed to proceed.   Other persons participating in the visit and their role in the encounter: None   Patient's location: Home  [facility]   Chief Complaint: F/u for continued mx of Polycythemia vera  I last had a phone visit with the patient on 01/07/23 and he was doing well overall but with some memory problems such as forgetting people's names. He also noted episodes of syncope preceded by lightheadedness.  Today, he states that he is doing well. He continues to have some intermittent episodes of lightheadedness but this has not worsened.  He has been regularly taking hydroxyurea as prescribed, multi-vitamins, and Eliquis.   Labs done today were discussed in detail with the patient.  MEDICAL HISTORY:  Past Medical History:  Diagnosis Date   BPH (benign prostatic hyperplasia)    Colon polyp    DDD (degenerative disc disease), cervical    High cholesterol    Migraine    Polycythemia vera ([facility])     SURGICAL HISTORY: Past Surgical History:  Procedure Laterality Date   EYE SURGERY      SOCIAL HISTORY: Social History   Socioeconomic  History   Marital status: Widowed    Spouse name: Not on file   Number of children: Not on file   Years of education: Not on file   Highest education level: Not on file  Occupational History   Not on file  Tobacco Use   Smoking status: Never   Smokeless tobacco: Never   Tobacco comments:    smoked x 1 year at age 77  Vaping Use   Vaping Use: Never used  Substance and Sexual Activity   Alcohol use: Yes    Comment: occ   Drug use: Never   Sexual activity: Not on file  Other Topics Concern   Not on file  Social History Narrative   Not on file   Social Determinants of Health   Financial Resource Strain: Not on file  Food Insecurity: Not on file  Transportation Needs: Not on file  Physical Activity: Not on file  Stress: Not on file  Social Connections: Not on file  Intimate Partner Violence: Not on file    FAMILY HISTORY: No family history on file.  ALLERGIES:  has No Known Allergies.  MEDICATIONS:  Current Outpatient Medications  Medication Sig Dispense Refill   apixaban (ELIQUIS) 5 MG TABS tablet TAKE 1 TABLET(5 MG) BY MOUTH TWICE DAILY 60 tablet 2   Cyanocobalamin (B-12) 1000 MCG SUBL Place 1,000 mcg under the tongue daily. 30 tablet 5   folic acid (FOLVITE) 1 MG tablet TAKE 1 TABLET(1 MG) BY MOUTH DAILY 90 tablet 0   hydroxyurea (HYDREA) 500 MG capsule TAKE 1 CAPSULE  BY MOUTH ONCE WEEKLY ON MONDAYS. MAY TAKE WITH FOOD TO MINIMIZE GI SIDE EFFECTS 30 capsule 2   Multiple Vitamin (MULTIVITAMIN WITH MINERALS) TABS tablet Take 1 tablet by mouth daily.     No current facility-administered medications for this visit.    REVIEW OF SYSTEMS:   10 Point review of Systems was done is negative except as noted above.  PHYSICAL EXAMINATION: Telehealth Visit.  LABORATORY DATA:  I have reviewed the data as listed  .    Latest Ref Rng & Units 03/04/2023    8:04 AM 01/04/2023    8:53 AM 09/03/2022    3:16 PM  CBC  WBC 4.0 - 10.5 K/uL 11.6  9.7  5.7   Hemoglobin 13.0 -  17.0 g/dL 7.5  62.9  52.8   Hematocrit 39.0 - 52.0 % 23.1  32.7  35.0   Platelets 150 - 400 K/uL 129  154  235     .    Latest Ref Rng & Units 03/04/2023    8:04 AM 01/04/2023    8:53 AM 09/03/2022    3:16 PM  CMP  Glucose 70 - 99 mg/dL 96  95  413   BUN 8 - 23 mg/dL Creatinine 0.61 - 1.24 mg/dL 2.44  0.10  2.72   Sodium 135 - 145 mmol/L 139  142  139   Potassium 3.5 - 5.1 mmol/L 3.9  4.4  4.0   Chloride 98 - 111 mmol/L 105  107  104   CO2 22 - 32 mmol/L Calcium 8.9 - 10.3 mg/dL 9.7  9.6  9.6   Total Protein 6.5 - 8.1 g/dL 7.8  7.8  7.6   Total Bilirubin 0.3 - 1.2 mg/dL 0.9  0.6  0.7   Alkaline Phos 38 - 126 U/L 156  126  75   AST 15 - 41 U/L ALT 0 - 44 U/L 01/19/2020 [id number] Flow Pathology (BM Bx):   01/19/2020 [id number] BM Bx Report:    10/24/2019 JAK2 mutation testing:    RADIOGRAPHIC STUDIES: I have personally reviewed the radiological images as listed and agreed with the findings in the report. No results found.  ASSESSMENT & PLAN:   #1  JAK2 positive polycythemia vera  severe thrombocytosis with platelets of 1005k in diagnosis 10/24/2019 JAK2 GenotypR is "POSITIVE for the detection of the V617F mutation"  #2  History of Dural venous sinus thrombosis -likely triggered by his polycythemia and thrombocytosis.  Evaluation   01/19/2020 BM Bx ([id number]) revealed "BONE MARROW, ASPIRATE, CLOT, CORE:  -  Normocellular marrow with megakaryocytic hyperplasia and atypia  -  Documented JAK-2 (V617F) mutation. PERIPHERAL BLOOD: -  Pancytopenia."  PLAN:  -Discussed lab results from 03/04/2023 with the patient. CBC showed decreased hemoglobin of 7.5, hematocrit of 23.1, and platelets of 129K. He denies any increased lightheadedness or weakness. He denies any obvious bleeding problems.  - CBC also showed WBC count of 11.6K, eosinophils of 1.2K, and immature granulocytes of 0.23K. He reports recent  allergy issues which turned into cold-like symptoms requiring Mucinex x1 week. - LDH has increased from 296 to 518 in the last x2 weeks. -We will hold the Hydroxyurea  BID. - Given his lab results, we discussed getting a repeat bone marrow biopsy. Patient is agreeable with this plan. -  Answered all of patient's questions. -Continue his Eliquis at 5 mg p.o. twice daily -Continue vitamin B complex 1 capsule p.o. daily.   FOLLOW-UP:  Patient recommended to hold hydroxyurea. Please schedule repeat labs and 1 unit of PRBC in 1 week. Stat CT-guided bone marrow aspiration and biopsy by radiology/IR. MD visit with labs and appointment for 1 unit of PRBC in 2 weeks.    The total time spent in the appointment was 21 minutes* .  All of the patient's questions were answered with apparent satisfaction. The patient knows to call the clinic with any problems, questions or concerns.   Wyvonnia Lora MD MS AAHIVMS [facility] Hematology/Oncology Physician [facility]  *Total Encounter Time as defined by the Centers for Medicare and Medicaid Services includes, in addition to the face-to-face time of a patient visit (documented in the note above) non-face-to-face time: obtaining and reviewing outside history, ordering and reviewing medications, tests or procedures, care coordination (communications with other health care professionals or caregivers) and documentation in the medical record.   I,Alexis Herring,acting as a Neurosurgeon for Wyvonnia Lora, MD.,have documented all relevant documentation on the behalf of Wyvonnia Lora, MD,as directed by  Wyvonnia Lora, MD while in the presence of Wyvonnia Lora, MD.  .I have reviewed the above documentation for accuracy and completeness, and I agree with the above. Johney Maine MD

## 2023-03-05 ENCOUNTER — Inpatient Hospital Stay (HOSPITAL_BASED_OUTPATIENT_CLINIC_OR_DEPARTMENT_OTHER): Payer: Medicare PPO | Admitting: Hematology

## 2023-03-05 DIAGNOSIS — D45 Polycythemia vera: Secondary | ICD-10-CM | POA: Diagnosis not present

## 2023-03-05 DIAGNOSIS — D649 Anemia, unspecified: Secondary | ICD-10-CM | POA: Diagnosis not present

## 2023-03-05 DIAGNOSIS — D696 Thrombocytopenia, unspecified: Secondary | ICD-10-CM

## 2023-03-06 ENCOUNTER — Telehealth: Payer: Self-pay | Admitting: Hematology

## 2023-03-11 ENCOUNTER — Other Ambulatory Visit: Payer: Self-pay

## 2023-03-11 ENCOUNTER — Encounter: Payer: Self-pay | Admitting: Hematology

## 2023-03-11 DIAGNOSIS — D45 Polycythemia vera: Secondary | ICD-10-CM

## 2023-03-12 ENCOUNTER — Other Ambulatory Visit: Payer: Self-pay | Admitting: Hematology

## 2023-03-12 ENCOUNTER — Inpatient Hospital Stay: Payer: Medicare PPO

## 2023-03-12 ENCOUNTER — Other Ambulatory Visit: Payer: Self-pay

## 2023-03-12 DIAGNOSIS — D45 Polycythemia vera: Secondary | ICD-10-CM | POA: Diagnosis not present

## 2023-03-12 DIAGNOSIS — D649 Anemia, unspecified: Secondary | ICD-10-CM

## 2023-03-12 LAB — ABO/RH: ABO/RH(D): O POS

## 2023-03-12 LAB — CBC WITH DIFFERENTIAL (CANCER CENTER ONLY)
Abs Immature Granulocytes: 0.95 10*3/uL — ABNORMAL HIGH (ref 0.00–0.07)
Band Neutrophils: 2 %
Basophils Absolute: 0 10*3/uL (ref 0.0–0.1)
Basophils Relative: 0 %
Blasts: 6 %
Eosinophils Absolute: 1.1 10*3/uL — ABNORMAL HIGH (ref 0.0–0.5)
Eosinophils Relative: 9 %
HCT: 22 % — ABNORMAL LOW (ref 39.0–52.0)
Hemoglobin: 7.3 g/dL — ABNORMAL LOW (ref 13.0–17.0)
Lymphocytes Relative: 21 %
Lymphs Abs: 2.5 10*3/uL (ref 0.7–4.0)
MCH: 32.7 pg (ref 26.0–34.0)
MCHC: 33.2 g/dL (ref 30.0–36.0)
MCV: 98.7 fL (ref 80.0–100.0)
Metamyelocytes Relative: 2 %
Monocytes Absolute: 0.8 10*3/uL (ref 0.1–1.0)
Monocytes Relative: 7 %
Myelocytes: 6 %
Neutro Abs: 5.8 10*3/uL (ref 1.7–7.7)
Neutrophils Relative %: 47 %
Other: 0 %
Platelet Count: 126 10*3/uL — ABNORMAL LOW (ref 150–400)
Promyelocytes Relative: 0 %
RBC: 2.23 MIL/uL — ABNORMAL LOW (ref 4.22–5.81)
RDW: 22 % — ABNORMAL HIGH (ref 11.5–15.5)
WBC Count: 11.9 10*3/uL — ABNORMAL HIGH (ref 4.0–10.5)
nRBC: 14.9 % — ABNORMAL HIGH (ref 0.0–0.2)
nRBC: 17 /100 WBC — ABNORMAL HIGH

## 2023-03-12 LAB — CMP (CANCER CENTER ONLY)
ALT: 12 U/L (ref 0–44)
AST: 25 U/L (ref 15–41)
Albumin: 4 g/dL (ref 3.5–5.0)
Alkaline Phosphatase: 157 U/L — ABNORMAL HIGH (ref 38–126)
Anion gap: 7 (ref 5–15)
BUN: 22 mg/dL (ref 8–23)
CO2: 26 mmol/L (ref 22–32)
Calcium: 9.7 mg/dL (ref 8.9–10.3)
Chloride: 106 mmol/L (ref 98–111)
Creatinine: 1.33 mg/dL — ABNORMAL HIGH (ref 0.61–1.24)
GFR, Estimated: 56 mL/min — ABNORMAL LOW (ref >60)
Glucose, Bld: 93 mg/dL (ref 70–99)
Potassium: 4.2 mmol/L (ref 3.5–5.1)
Sodium: 139 mmol/L (ref 135–145)
Total Bilirubin: 0.7 mg/dL (ref 0.3–1.2)
Total Protein: 7.7 g/dL (ref 6.5–8.1)

## 2023-03-12 LAB — IMMATURE PLATELET FRACTION: Immature Platelet Fraction: 6.1 % (ref 1.2–8.6)

## 2023-03-12 LAB — RETICULOCYTES
Immature Retic Fract: 43.6 % — ABNORMAL HIGH (ref 2.3–15.9)
RBC.: 2.25 MIL/uL — ABNORMAL LOW (ref 4.22–5.81)
Retic Count, Absolute: 41.4 10*3/uL (ref 19.0–186.0)
Retic Ct Pct: 1.8 % (ref 0.4–3.1)

## 2023-03-12 LAB — TYPE AND SCREEN: Antibody Screen: NEGATIVE

## 2023-03-12 LAB — LACTATE DEHYDROGENASE: LDH: 547 U/L — ABNORMAL HIGH (ref 98–192)

## 2023-03-12 LAB — PREPARE RBC (CROSSMATCH)

## 2023-03-12 MED ORDER — ACETAMINOPHEN 325 MG PO TABS
650.0000 mg | ORAL_TABLET | Freq: Once | ORAL | Status: AC
  Administered 2023-03-12: 650 mg via ORAL
  Filled 2023-03-12: qty 2

## 2023-03-12 MED ORDER — SODIUM CHLORIDE 0.9% IV SOLUTION
250.0000 mL | Freq: Once | INTRAVENOUS | Status: AC
  Administered 2023-03-12: 250 mL via INTRAVENOUS

## 2023-03-12 MED ORDER — METHYLPREDNISOLONE SODIUM SUCC 40 MG IJ SOLR
40.0000 mg | Freq: Once | INTRAMUSCULAR | Status: AC
  Administered 2023-03-12: 40 mg via INTRAVENOUS
  Filled 2023-03-12: qty 1

## 2023-03-12 NOTE — Patient Instructions (Signed)

## 2023-03-13 ENCOUNTER — Other Ambulatory Visit: Payer: Self-pay | Admitting: Student

## 2023-03-13 DIAGNOSIS — D75839 Thrombocytosis, unspecified: Secondary | ICD-10-CM

## 2023-03-13 LAB — BPAM RBC
Blood Product Expiration Date: 202405202359
ISSUE DATE / TIME: 202404231526
Unit Type and Rh: 5100

## 2023-03-13 LAB — TYPE AND SCREEN
ABO/RH(D): O POS
Unit division: 0

## 2023-03-13 NOTE — H&P (Signed)
Referring Physician(s): Johney Maine  Supervising Physician: Simonne Come  Patient Status:  [facility]  Chief Complaint:  "I'm getting a bone marrow biopsy"  Subjective: Patient known to IR team from bone marrow biopsy on 01/19/2020.  He has a history of JAK2 positive polycythemia vera/severe thrombocytosis, dural venous sinus thrombosis, BPH, degenerative disc disease, hyperlipidemia, migraines.  He presents today for follow-up image guided bone marrow biopsy due to concern for leukemic transformation versus secondary myelofibrosis.   Past Medical History:  Diagnosis Date   BPH (benign prostatic hyperplasia)    Colon polyp    DDD (degenerative disc disease), cervical    High cholesterol    Migraine    Polycythemia vera (HCC)    Past Surgical History:  Procedure Laterality Date   EYE SURGERY        Allergies: Patient has no known allergies.  Medications: Prior to Admission medications   Medication Sig Start Date End Date Taking? Authorizing Provider  apixaban (ELIQUIS) 5 MG TABS tablet TAKE 1 TABLET(5 MG) BY MOUTH TWICE DAILY 01/07/23   Johney Maine, MD  Cyanocobalamin (B-12) 1000 MCG SUBL Place 1,000 mcg under the tongue daily. 08/10/20   Johney Maine, MD  folic acid (FOLVITE) 1 MG tablet TAKE 1 TABLET(1 MG) BY MOUTH DAILY 02/06/21   Johney Maine, MD  hydroxyurea (HYDREA) 500 MG capsule TAKE 1 CAPSULE BY MOUTH ONCE WEEKLY ON MONDAYS. MAY TAKE WITH FOOD TO MINIMIZE GI SIDE EFFECTS 10/02/22   Doreatha Massed, MD  Multiple Vitamin (MULTIVITAMIN WITH MINERALS) TABS tablet Take 1 tablet by mouth daily. 10/28/19   Kathlen Mody, MD     Vital Signs:     Code Status:   Physical Exam  Imaging: No results found.  Labs:  CBC: Recent Labs    09/03/22 1516 01/04/23 0853 03/04/23 0804 03/12/23 1252  WBC 5.7 9.7 11.6* 11.9*  HGB 11.7* 10.8* 7.5* 7.3*  HCT 35.0* 32.7* 23.1* 22.0*  PLT 235 154 129* 126*    COAGS: No results for  input(s): "INR", "APTT" in the last 8760 hours.  BMP: Recent Labs    09/03/22 1516 01/04/23 0853 03/04/23 0804 03/12/23 1252  NA 139 142 139 139  K 4.0 4.4 3.9 4.2  CL 104 107 105 106  CO2 GLUCOSE 100* 95 96 93  BUN 18 26* 25* 22  CALCIUM 9.6 9.6 9.7 9.7  CREATININE 1.21 1.31* 1.42* 1.33*  GFRNONAA >60 57* 52* 56*    LIVER FUNCTION TESTS: Recent Labs    09/03/22 1516 01/04/23 0853 03/04/23 0804 03/12/23 1252  BILITOT 0.7 0.6 0.9 0.7  AST ALT ALKPHOS 75 126 156* 157*  PROT 7.6 7.8 7.8 7.7  ALBUMIN 4.5 4.4 4.0 4.0    Assessment and Plan: 77 yo male with history of JAK2 positive polycythemia vera/severe thrombocytosis, dural venous sinus thrombosis, BPH, degenerative disc disease, hyperlipidemia, migraines.  He presents today for follow-up image guided bone marrow biopsy due to concern for leukemic transformation versus secondary myelofibrosis.Risks and benefits of procedure was discussed with the patient   including, but not limited to bleeding, infection, damage to adjacent structures or low yield requiring additional tests.  All of the questions were answered and there is agreement to proceed.  Consent signed and in chart.    Electronically Signed: D. Jeananne Rama, PA-C 03/13/2023, 2:44 PM   I spent a total of 20 minutes at the the  patient's bedside AND on the patient's hospital floor or unit, greater than 50% of which was counseling/coordinating care for image guided bone marrow biopsy

## 2023-03-14 ENCOUNTER — Ambulatory Visit (HOSPITAL_COMMUNITY)
Admission: RE | Admit: 2023-03-14 | Discharge: 2023-03-14 | Disposition: A | Discharge status: Home / Self Care | Payer: Medicare PPO | Source: Ambulatory Visit | Attending: Hematology | Admitting: Hematology

## 2023-03-14 ENCOUNTER — Other Ambulatory Visit: Payer: Self-pay | Admitting: Hematology

## 2023-03-14 ENCOUNTER — Encounter (HOSPITAL_COMMUNITY): Payer: Self-pay

## 2023-03-14 ENCOUNTER — Other Ambulatory Visit: Payer: Self-pay

## 2023-03-14 DIAGNOSIS — M5030 Other cervical disc degeneration, unspecified cervical region: Secondary | ICD-10-CM | POA: Diagnosis not present

## 2023-03-14 DIAGNOSIS — D696 Thrombocytopenia, unspecified: Secondary | ICD-10-CM

## 2023-03-14 DIAGNOSIS — D649 Anemia, unspecified: Secondary | ICD-10-CM

## 2023-03-14 DIAGNOSIS — E785 Hyperlipidemia, unspecified: Secondary | ICD-10-CM | POA: Insufficient documentation

## 2023-03-14 DIAGNOSIS — D704 Cyclic neutropenia: Secondary | ICD-10-CM | POA: Diagnosis not present

## 2023-03-14 DIAGNOSIS — D759 Disease of blood and blood-forming organs, unspecified: Secondary | ICD-10-CM | POA: Diagnosis not present

## 2023-03-14 DIAGNOSIS — D45 Polycythemia vera: Secondary | ICD-10-CM | POA: Diagnosis not present

## 2023-03-14 DIAGNOSIS — D75839 Thrombocytosis, unspecified: Secondary | ICD-10-CM

## 2023-03-14 LAB — CBC WITH DIFFERENTIAL/PLATELET
Abs Immature Granulocytes: 1 10*3/uL — ABNORMAL HIGH (ref 0.00–0.07)
Band Neutrophils: 2 %
Basophils Absolute: 0 10*3/uL (ref 0.0–0.1)
Basophils Relative: 0 %
Blasts: 5 %
Eosinophils Absolute: 0.5 10*3/uL (ref 0.0–0.5)
Eosinophils Relative: 4 %
HCT: 26.8 % — ABNORMAL LOW (ref 39.0–52.0)
Hemoglobin: 8.3 g/dL — ABNORMAL LOW (ref 13.0–17.0)
Lymphocytes Relative: 16 %
Lymphs Abs: 2.2 10*3/uL (ref 0.7–4.0)
MCH: 31.2 pg (ref 26.0–34.0)
MCHC: 31 g/dL (ref 30.0–36.0)
MCV: 100.8 fL — ABNORMAL HIGH (ref 80.0–100.0)
Metamyelocytes Relative: 2 %
Monocytes Absolute: 0.7 10*3/uL (ref 0.1–1.0)
Monocytes Relative: 5 %
Myelocytes: 5 %
Neutro Abs: 8.6 10*3/uL — ABNORMAL HIGH (ref 1.7–7.7)
Neutrophils Relative %: 61 %
Platelets: 116 10*3/uL — ABNORMAL LOW (ref 150–400)
RBC: 2.66 MIL/uL — ABNORMAL LOW (ref 4.22–5.81)
RDW: 21 % — ABNORMAL HIGH (ref 11.5–15.5)
WBC: 13.6 10*3/uL — ABNORMAL HIGH (ref 4.0–10.5)
nRBC: 12.8 % — ABNORMAL HIGH (ref 0.0–0.2)

## 2023-03-14 MED ORDER — MIDAZOLAM HCL 2 MG/2ML IJ SOLN
INTRAMUSCULAR | Status: AC
  Filled 2023-03-14: qty 4

## 2023-03-14 MED ORDER — FENTANYL CITRATE (PF) 100 MCG/2ML IJ SOLN
INTRAMUSCULAR | Status: AC
  Filled 2023-03-14: qty 2

## 2023-03-14 MED ORDER — LIDOCAINE HCL (PF) 1 % IJ SOLN
INTRAMUSCULAR | Status: AC | PRN
  Administered 2023-03-14: 20 mL

## 2023-03-14 MED ORDER — SODIUM CHLORIDE 0.9 % IV SOLN: INTRAVENOUS | Status: DC

## 2023-03-14 MED ORDER — MIDAZOLAM HCL 2 MG/2ML IJ SOLN
INTRAMUSCULAR | Status: AC | PRN
  Administered 2023-03-14 (×2): 1 mg via INTRAVENOUS

## 2023-03-14 MED ORDER — FENTANYL CITRATE (PF) 100 MCG/2ML IJ SOLN
INTRAMUSCULAR | Status: AC | PRN
  Administered 2023-03-14 (×2): 50 ug via INTRAVENOUS

## 2023-03-14 NOTE — Procedures (Signed)
Pre-procedure Diagnosis: Concern for leukemic transformation versus secondary myelofibrosis  Post-procedure Diagnosis: Same  Technically successful CT guided bone marrow aspiration and biopsy of left iliac crest.   Complications: None Immediate  EBL: None  Signed: Simonne Come Pager: [phone] 03/14/2023, 11:50 AM

## 2023-03-18 LAB — SURGICAL PATHOLOGY

## 2023-03-19 ENCOUNTER — Other Ambulatory Visit: Payer: Self-pay

## 2023-03-19 DIAGNOSIS — D45 Polycythemia vera: Secondary | ICD-10-CM

## 2023-03-19 LAB — SURGICAL PATHOLOGY

## 2023-03-20 ENCOUNTER — Inpatient Hospital Stay: Payer: Medicare PPO

## 2023-03-20 ENCOUNTER — Inpatient Hospital Stay: Payer: Medicare PPO | Attending: Hematology | Admitting: Hematology

## 2023-03-20 VITALS — BP 115/68 | HR 84 | Temp 98.0°F | Resp 18 | Wt 154.9 lb

## 2023-03-20 DIAGNOSIS — Z7901 Long term (current) use of anticoagulants: Secondary | ICD-10-CM | POA: Diagnosis not present

## 2023-03-20 DIAGNOSIS — D45 Polycythemia vera: Secondary | ICD-10-CM | POA: Diagnosis not present

## 2023-03-20 DIAGNOSIS — R519 Headache, unspecified: Secondary | ICD-10-CM | POA: Diagnosis not present

## 2023-03-20 DIAGNOSIS — Z87891 Personal history of nicotine dependence: Secondary | ICD-10-CM | POA: Insufficient documentation

## 2023-03-20 DIAGNOSIS — Z79899 Other long term (current) drug therapy: Secondary | ICD-10-CM | POA: Insufficient documentation

## 2023-03-20 DIAGNOSIS — D649 Anemia, unspecified: Secondary | ICD-10-CM | POA: Insufficient documentation

## 2023-03-20 DIAGNOSIS — D75839 Thrombocytosis, unspecified: Secondary | ICD-10-CM | POA: Insufficient documentation

## 2023-03-20 DIAGNOSIS — D7581 Myelofibrosis: Secondary | ICD-10-CM | POA: Insufficient documentation

## 2023-03-20 DIAGNOSIS — R109 Unspecified abdominal pain: Secondary | ICD-10-CM | POA: Insufficient documentation

## 2023-03-20 DIAGNOSIS — Z86718 Personal history of other venous thrombosis and embolism: Secondary | ICD-10-CM | POA: Insufficient documentation

## 2023-03-20 LAB — CMP (CANCER CENTER ONLY)
ALT: 11 U/L (ref 0–44)
AST: 24 U/L (ref 15–41)
Albumin: 4 g/dL (ref 3.5–5.0)
Alkaline Phosphatase: 171 U/L — ABNORMAL HIGH (ref 38–126)
Anion gap: 6 (ref 5–15)
BUN: 21 mg/dL (ref 8–23)
CO2: 30 mmol/L (ref 22–32)
Calcium: 9.6 mg/dL (ref 8.9–10.3)
Chloride: 103 mmol/L (ref 98–111)
Creatinine: 1.25 mg/dL — ABNORMAL HIGH (ref 0.61–1.24)
GFR, Estimated: >60 mL/min (ref >60)
Glucose, Bld: 115 mg/dL — ABNORMAL HIGH (ref 70–99)
Potassium: 3.9 mmol/L (ref 3.5–5.1)
Sodium: 139 mmol/L (ref 135–145)
Total Bilirubin: 0.8 mg/dL (ref 0.3–1.2)
Total Protein: 7.6 g/dL (ref 6.5–8.1)

## 2023-03-20 LAB — CBC WITH DIFFERENTIAL (CANCER CENTER ONLY)
Abs Immature Granulocytes: 1 10*3/uL — ABNORMAL HIGH (ref 0.00–0.07)
Band Neutrophils: 3 %
Basophils Absolute: 0 10*3/uL (ref 0.0–0.1)
Basophils Relative: 0 %
Blasts: 6 %
Eosinophils Absolute: 1.4 10*3/uL — ABNORMAL HIGH (ref 0.0–0.5)
Eosinophils Relative: 8 %
HCT: 26.9 % — ABNORMAL LOW (ref 39.0–52.0)
Hemoglobin: 8.7 g/dL — ABNORMAL LOW (ref 13.0–17.0)
Lymphocytes Relative: 29 %
Lymphs Abs: 4.9 10*3/uL — ABNORMAL HIGH (ref 0.7–4.0)
MCH: 32.3 pg (ref 26.0–34.0)
MCHC: 32.3 g/dL (ref 30.0–36.0)
MCV: 100 fL (ref 80.0–100.0)
Metamyelocytes Relative: 1 %
Monocytes Absolute: 1 10*3/uL (ref 0.1–1.0)
Monocytes Relative: 6 %
Myelocytes: 5 %
Neutro Abs: 7.7 10*3/uL (ref 1.7–7.7)
Neutrophils Relative %: 42 %
Platelet Count: 106 10*3/uL — ABNORMAL LOW (ref 150–400)
RBC: 2.69 MIL/uL — ABNORMAL LOW (ref 4.22–5.81)
RDW: 20.2 % — ABNORMAL HIGH (ref 11.5–15.5)
WBC Count: 17 10*3/uL — ABNORMAL HIGH (ref 4.0–10.5)
nRBC: 8.4 % — ABNORMAL HIGH (ref 0.0–0.2)
nRBC: 9 /100 WBC — ABNORMAL HIGH

## 2023-03-20 LAB — RETICULOCYTES
Immature Retic Fract: 36.4 % — ABNORMAL HIGH (ref 2.3–15.9)
RBC.: 2.72 MIL/uL — ABNORMAL LOW (ref 4.22–5.81)
Retic Count, Absolute: 41.3 10*3/uL (ref 19.0–186.0)
Retic Ct Pct: 1.5 % (ref 0.4–3.1)

## 2023-03-20 LAB — SAMPLE TO BLOOD BANK

## 2023-03-20 LAB — LACTATE DEHYDROGENASE: LDH: 617 U/L — ABNORMAL HIGH (ref 98–192)

## 2023-03-20 NOTE — Progress Notes (Signed)
HEMATOLOGY/ONCOLOGY CLINIC VISIT NOTE  Date of Service: 03/20/2023   Patient Care Team: [name], MD as PCP - General (Family Medicine)  CHIEF COMPLAINTS/PURPOSE OF CONSULTATION:  F/u for continued mx of Polycythemia vera and newly diagnosed post PV secondary myelofibrosis.  HISTORY OF PRESENTING ILLNESS:   Please see previous note for details on initial presentation  INTERVAL HISTORY:   Dillon Hill is a 77 y.o. male here for continued evaluation and management of Polycythemia vera. Patient was last seen by me on 03/05/2023 and complained of stable intermittent episodes of lightheadedness, but was otherwise doing well overall.   Today, he reports that he has felt well overall since his last visit and that his blood transfusion has improved his symptoms of fatigue. He continues to take Hydroxyurea once a week.  He complains of mild abdominal pain/fullness, but reports that it does not limit his food intake. He does experience mild muscular chest pain which he attributes to exercise.  He reports increased headaches recently which he attributes to sleep disturbances. He does drink water regularly and has needed to use a couple tablets of Tylenol for pain, but denies needing to use Tylenol on a regular basis. He denies any skin rashes, fever, itching, chills, or infection issues.  MEDICAL HISTORY:  Past Medical History:  Diagnosis Date   BPH (benign prostatic hyperplasia)    Colon polyp    DDD (degenerative disc disease), cervical    High cholesterol    Migraine    Polycythemia vera (HCC)     SURGICAL HISTORY: Past Surgical History:  Procedure Laterality Date   EYE SURGERY      SOCIAL HISTORY: Social History   Socioeconomic History   Marital status: Widowed    Spouse name: Not on file   Number of children: Not on file   Years of education: Not on file   Highest education level: Not on file  Occupational History   Not on file  Tobacco Use   Smoking status:  Never   Smokeless tobacco: Never   Tobacco comments:    smoked x 1 year at age 4  Vaping Use   Vaping Use: Never used  Substance and Sexual Activity   Alcohol use: Yes    Comment: occ   Drug use: Never   Sexual activity: Not on file  Other Topics Concern   Not on file  Social History Narrative   Not on file   Social Determinants of Health   Financial Resource Strain: Not on file  Food Insecurity: Not on file  Transportation Needs: Not on file  Physical Activity: Not on file  Stress: Not on file  Social Connections: Not on file  Intimate Partner Violence: Not on file    FAMILY HISTORY: No family history on file.  ALLERGIES:  has No Known Allergies.  MEDICATIONS:  Current Outpatient Medications  Medication Sig Dispense Refill   apixaban (ELIQUIS) 5 MG TABS tablet TAKE 1 TABLET(5 MG) BY MOUTH TWICE DAILY 60 tablet 2   Cyanocobalamin (B-12) 1000 MCG SUBL Place 1,000 mcg under the tongue daily. 30 tablet 5   folic acid (FOLVITE) 1 MG tablet TAKE 1 TABLET(1 MG) BY MOUTH DAILY 90 tablet 0   hydroxyurea (HYDREA) 500 MG capsule TAKE 1 CAPSULE BY MOUTH ONCE WEEKLY ON MONDAYS. MAY TAKE WITH FOOD TO MINIMIZE GI SIDE EFFECTS 30 capsule 2   Multiple Vitamin (MULTIVITAMIN WITH MINERALS) TABS tablet Take 1 tablet by mouth daily.     No current facility-administered  medications for this visit.    REVIEW OF SYSTEMS:    10 Point review of Systems was done is negative except as noted above.   PHYSICAL EXAMINATION: .BP 115/68   Pulse 84   Temp 98 F (36.7 C)   Resp 18   Wt 154 lb 14.4 oz (70.3 kg)   SpO2 100%   BMI 21.91 kg/m   GENERAL:alert, in no acute distress and comfortable SKIN: no acute rashes, no significant lesions EYES: conjunctiva are pink and non-injected, sclera anicteric OROPHARYNX: MMM, no exudates, no oropharyngeal erythema or ulceration NECK: supple, no JVD LYMPH:  no palpable lymphadenopathy in the cervical, axillary or inguinal regions LUNGS: clear to  auscultation b/l with normal respiratory effort HEART: regular rate & rhythm ABDOMEN:  normoactive bowel sounds , non tender, not distended. Extremity: no pedal edema PSYCH: alert & oriented x 3 with fluent speech NEURO: no focal motor/sensory deficits   LABORATORY DATA:  I have reviewed the data as listed  .    Latest Ref Rng & Units 03/20/2023    9:28 AM 03/14/2023    9:20 AM 03/12/2023   12:52 PM  CBC  WBC 4.0 - 10.5 K/uL 17.0  13.6  11.9   Hemoglobin 13.0 - 17.0 g/dL 8.7  8.3  7.3   Hematocrit 39.0 - 52.0 % 26.9  26.8  22.0   Platelets 150 - 400 K/uL 106  116  126     .    Latest Ref Rng & Units 03/20/2023    9:28 AM 03/12/2023   12:52 PM 03/04/2023    8:04 AM  CMP  Glucose 70 - 99 mg/dL 161  93  96   BUN 8 - 23 mg/dL 21  22  25    Creatinine 0.61 - 1.24 mg/dL 0.96  0.45  4.09   Sodium 135 - 145 mmol/L 139  139  139   Potassium 3.5 - 5.1 mmol/L 3.9  4.2  3.9   Chloride 98 - 111 mmol/L 103  106  105   CO2 22 - 32 mmol/L 30  26  26    Calcium 8.9 - 10.3 mg/dL 9.6  9.7  9.7   Total Protein 6.5 - 8.1 g/dL 7.6  7.7  7.8   Total Bilirubin 0.3 - 1.2 mg/dL 0.8  0.7  0.9   Alkaline Phos 38 - 126 U/L 171  157  156   AST 15 - 41 U/L 24  25  25    ALT 0 - 44 U/L 11  12  11              01/19/2020 [id number] Flow Pathology (BM Bx):   01/19/2020 [id number] BM Bx Report:    10/24/2019 JAK2 mutation testing:    RADIOGRAPHIC STUDIES: I have personally reviewed the radiological images as listed and agreed with the findings in the report. CT BONE MARROW BIOPSY & ASPIRATION  Result Date: 03/14/2023 INDICATION: Evaluate for leukemic transformation versus secondary myelofibrosis. Please perform CT-guided bone marrow biopsy for tissue diagnostic purposes. EXAM: CT-GUIDED BONE MARROW BIOPSY AND ASPIRATION MEDICATIONS: None ANESTHESIA/SEDATION: Moderate (conscious) sedation was employed during this procedure as administered by the Interventional Radiology RN. A total of Versed 2 mg and  Fentanyl 100 mcg was administered intravenously. Moderate Sedation Time: 10 minutes. The patient's level of consciousness and vital signs were monitored continuously by radiology nursing throughout the procedure under my direct supervision. COMPLICATIONS: None immediate. PROCEDURE: Informed consent was obtained from the patient following an explanation of the procedure, risks,  benefits and alternatives. The patient understands, agrees and consents for the procedure. All questions were addressed. A time out was performed prior to the initiation of the procedure. The patient was positioned prone and non-contrast localization CT was performed of the pelvis to demonstrate the iliac marrow spaces. The operative site was prepped and draped in the usual sterile fashion. Under sterile conditions and local anesthesia, a 22 gauge spinal needle was utilized for procedural planning. Next, an 11 gauge coaxial bone biopsy needle was advanced into the left iliac marrow space. Needle position was confirmed with CT imaging. Initially, a bone marrow aspiration was attempted however yielded no significant aspirate. Next, a bone marrow biopsy was obtained with the 11 gauge outer bone marrow device. Given lack of a significant bone marrow aspirate, the 11 gauge coaxial bone biopsy needle was re-advanced into a slightly different location within the left iliac marrow space, positioning was confirmed with CT imaging and an additional bone marrow biopsy was obtained. The needle was removed and superficial hemostasis was obtained with manual compression. A dressing was applied. The patient tolerated the procedure well without immediate post procedural complication. IMPRESSION: Successful CT guided left iliac bone marrow biopsy. As detailed above, no marrow was able to be aspirated during today's CT-guided biopsy. Electronically Signed   By: Simonne Come M.D.   On: 03/14/2023 14:13    ASSESSMENT & PLAN:   #1  JAK2 positive polycythemia  vera  severe thrombocytosis with platelets of 1005k in diagnosis 10/24/2019 JAK2 GenotypR is "POSITIVE for the detection of the V617F mutation"  #2  History of Dural venous sinus thrombosis -likely triggered by his polycythemia and thrombocytosis.  Evaluation   01/19/2020 BM Bx ([id number]) revealed "BONE MARROW, ASPIRATE, CLOT, CORE:  -  Normocellular marrow with megakaryocytic hyperplasia and atypia  -  Documented JAK-2 (V617F) mutation. PERIPHERAL BLOOD: -  Pancytopenia."  PLAN:   -Discussed lab results on 03/20/2023 in detail with patient. CBC showed WBC of 17.0K, hemoglobin of 8.7, and platelets of 106K. -anemic with hgb of 8.7 -genetic labs pending -discussed findings which show abnormal immature WBC in peripheral blood -no acute leukumia found in bone marrow at this time.  -discussed details of JAK-2 mutation -Discussed that when the bone marrow becomes overactive over time: Bone marrow can burn out due to overactivity, can also sometumes stimulate supporting cells in bone marrow which can produce scar tissue New mutations can transform to acute leukemia -bone marrow shows grade 3 scarring /fibrosis in the bone marrow which pushes immature cells into bloodstream -patient's disease is a high risk process,which is involving progressive scarring -recommend [name] to rule out any enlarged spleen -discussed that genetic and molecur studies would determine how quickly the disease may progress and define further prognostication. -discussed option of bone marrow transplant as curative treatment. Discussed important factors such as age, medical issues, and genetics for consideration. Given that patient is over 76 years of age, procedure would be high-risk and is not recommended -would recommend patient to start medication such as Jakafi to limit continued scarring and limit spleen to minimize symptoms -hold Hydroxyurea at this time -Continue his Eliquis at 5 mg p.o. twice daily -Continue  vitamin B complex 1 capsule p.o. daily. -repeat labs in 2-3 weeks to discuss risk profile and determine whether to start [facility] or continue to monitor -discussed option of supportive care such as occasional blood transfusion or medication to address anemia -discussed recommendation of referral to [facility], [facility] [place], or [facility] for any clinical trials  or discussion of bone marrow transplant -discussed option of OTC 1-2 MG Melatonin to improve sleep habits -Advised patient to limit caffeine, stay regularly active, and limit screen time before bed to improve sleep habits   FOLLOW-UP: [name] abd in 1 week RTC with Dr Candise Che with labs in 3 weeks with appointment for 1 unit of PRBC  The total time spent in the appointment was 40 minutes* .  All of the patient's questions were answered with apparent satisfaction. The patient knows to call the clinic with any problems, questions or concerns.   Wyvonnia Lora MD MS AAHIVMS [facility] Hematology/Oncology Physician [facility]  .*Total Encounter Time as defined by the Centers for Medicare and Medicaid Services includes, in addition to the face-to-face time of a patient visit (documented in the note above) non-face-to-face time: obtaining and reviewing outside history, ordering and reviewing medications, tests or procedures, care coordination (communications with other health care professionals or caregivers) and documentation in the medical record.    I,Mitra Faeizi,acting as a Neurosurgeon for Wyvonnia Lora, MD.,have documented all relevant documentation on the behalf of Wyvonnia Lora, MD,as directed by  Wyvonnia Lora, MD while in the presence of Wyvonnia Lora, MD.  .I have reviewed the above documentation for accuracy and completeness, and I agree with the above. Johney Maine MD

## 2023-03-21 ENCOUNTER — Encounter (HOSPITAL_COMMUNITY): Payer: Self-pay | Admitting: Hematology

## 2023-03-26 ENCOUNTER — Encounter: Payer: Self-pay | Admitting: Hematology

## 2023-03-27 ENCOUNTER — Other Ambulatory Visit: Payer: Self-pay

## 2023-03-27 ENCOUNTER — Telehealth: Payer: Self-pay | Admitting: Hematology

## 2023-03-27 DIAGNOSIS — D45 Polycythemia vera: Secondary | ICD-10-CM

## 2023-03-29 ENCOUNTER — Other Ambulatory Visit: Payer: Self-pay | Admitting: *Deleted

## 2023-03-29 DIAGNOSIS — D7581 Myelofibrosis: Secondary | ICD-10-CM

## 2023-04-01 ENCOUNTER — Other Ambulatory Visit: Payer: Self-pay | Admitting: *Deleted

## 2023-04-01 DIAGNOSIS — D696 Thrombocytopenia, unspecified: Secondary | ICD-10-CM

## 2023-04-01 DIAGNOSIS — D45 Polycythemia vera: Secondary | ICD-10-CM

## 2023-04-01 DIAGNOSIS — D7581 Myelofibrosis: Secondary | ICD-10-CM

## 2023-04-02 ENCOUNTER — Telehealth: Payer: Self-pay | Admitting: *Deleted

## 2023-04-02 ENCOUNTER — Inpatient Hospital Stay: Payer: Medicare PPO

## 2023-04-02 ENCOUNTER — Other Ambulatory Visit: Payer: Self-pay

## 2023-04-02 ENCOUNTER — Ambulatory Visit (HOSPITAL_COMMUNITY)
Admission: RE | Admit: 2023-04-02 | Discharge: 2023-04-02 | Disposition: A | Discharge status: Home / Self Care | Payer: Medicare PPO | Source: Ambulatory Visit | Attending: Hematology | Admitting: Hematology

## 2023-04-02 ENCOUNTER — Other Ambulatory Visit: Payer: Self-pay | Admitting: *Deleted

## 2023-04-02 DIAGNOSIS — R109 Unspecified abdominal pain: Secondary | ICD-10-CM | POA: Diagnosis not present

## 2023-04-02 DIAGNOSIS — D7581 Myelofibrosis: Secondary | ICD-10-CM | POA: Diagnosis not present

## 2023-04-02 DIAGNOSIS — D75839 Thrombocytosis, unspecified: Secondary | ICD-10-CM | POA: Diagnosis not present

## 2023-04-02 DIAGNOSIS — D45 Polycythemia vera: Secondary | ICD-10-CM

## 2023-04-02 DIAGNOSIS — Z7901 Long term (current) use of anticoagulants: Secondary | ICD-10-CM | POA: Diagnosis not present

## 2023-04-02 DIAGNOSIS — Z87891 Personal history of nicotine dependence: Secondary | ICD-10-CM | POA: Diagnosis not present

## 2023-04-02 DIAGNOSIS — D649 Anemia, unspecified: Secondary | ICD-10-CM | POA: Diagnosis not present

## 2023-04-02 DIAGNOSIS — D696 Thrombocytopenia, unspecified: Secondary | ICD-10-CM

## 2023-04-02 DIAGNOSIS — R519 Headache, unspecified: Secondary | ICD-10-CM | POA: Diagnosis not present

## 2023-04-02 DIAGNOSIS — Z86718 Personal history of other venous thrombosis and embolism: Secondary | ICD-10-CM | POA: Diagnosis not present

## 2023-04-02 LAB — CBC WITH DIFFERENTIAL (CANCER CENTER ONLY)
Abs Immature Granulocytes: 1 10*3/uL — ABNORMAL HIGH (ref 0.00–0.07)
Basophils Absolute: 0 10*3/uL (ref 0.0–0.1)
Basophils Relative: 0 %
Blasts: 6 %
Eosinophils Absolute: 1.2 10*3/uL — ABNORMAL HIGH (ref 0.0–0.5)
Eosinophils Relative: 7 %
HCT: 23.2 % — ABNORMAL LOW (ref 39.0–52.0)
Hemoglobin: 7.4 g/dL — ABNORMAL LOW (ref 13.0–17.0)
Lymphocytes Relative: 25 %
Lymphs Abs: 4.3 10*3/uL — ABNORMAL HIGH (ref 0.7–4.0)
MCH: 31.9 pg (ref 26.0–34.0)
MCHC: 31.9 g/dL (ref 30.0–36.0)
MCV: 100 fL (ref 80.0–100.0)
Metamyelocytes Relative: 4 %
Monocytes Absolute: 1 10*3/uL (ref 0.1–1.0)
Monocytes Relative: 6 %
Myelocytes: 2 %
Neutro Abs: 8.6 10*3/uL — ABNORMAL HIGH (ref 1.7–7.7)
Neutrophils Relative %: 50 %
Platelet Count: 88 10*3/uL — ABNORMAL LOW (ref 150–400)
RBC: 2.32 MIL/uL — ABNORMAL LOW (ref 4.22–5.81)
RDW: 20.3 % — ABNORMAL HIGH (ref 11.5–15.5)
WBC Count: 17.2 10*3/uL — ABNORMAL HIGH (ref 4.0–10.5)
nRBC: 11 % — ABNORMAL HIGH (ref 0.0–0.2)
nRBC: 16 /100 WBC — ABNORMAL HIGH

## 2023-04-02 LAB — CMP (CANCER CENTER ONLY)
ALT: 9 U/L (ref 0–44)
AST: 27 U/L (ref 15–41)
Albumin: 4 g/dL (ref 3.5–5.0)
Alkaline Phosphatase: 168 U/L — ABNORMAL HIGH (ref 38–126)
Anion gap: 7 (ref 5–15)
BUN: 24 mg/dL — ABNORMAL HIGH (ref 8–23)
CO2: 25 mmol/L (ref 22–32)
Calcium: 9.5 mg/dL (ref 8.9–10.3)
Chloride: 105 mmol/L (ref 98–111)
Creatinine: 1.47 mg/dL — ABNORMAL HIGH (ref 0.61–1.24)
GFR, Estimated: 49 mL/min — ABNORMAL LOW (ref >60)
Glucose, Bld: 96 mg/dL (ref 70–99)
Potassium: 4.1 mmol/L (ref 3.5–5.1)
Sodium: 137 mmol/L (ref 135–145)
Total Bilirubin: 0.8 mg/dL (ref 0.3–1.2)
Total Protein: 7.8 g/dL (ref 6.5–8.1)

## 2023-04-02 LAB — RETICULOCYTES
Immature Retic Fract: 38.4 % — ABNORMAL HIGH (ref 2.3–15.9)
RBC.: 2.32 MIL/uL — ABNORMAL LOW (ref 4.22–5.81)
Retic Count, Absolute: 33.2 10*3/uL (ref 19.0–186.0)
Retic Ct Pct: 1.4 % (ref 0.4–3.1)

## 2023-04-02 LAB — SAMPLE TO BLOOD BANK

## 2023-04-02 LAB — LACTATE DEHYDROGENASE: LDH: 715 U/L — ABNORMAL HIGH (ref 98–192)

## 2023-04-02 NOTE — Telephone Encounter (Signed)
Patient contacted office. Scheduled for 1 unit PRBCs at 0730 04/03/23 per Dr. Clyda Greener verbal order

## 2023-04-02 NOTE — Telephone Encounter (Signed)
Contacted patient - LVM. Informing him of need for transfusion with available times tomorrow - 0730 or 1p. Requested patient call office as soon as convenient

## 2023-04-02 NOTE — Telephone Encounter (Signed)
CRITICAL VALUE STICKER  CRITICAL VALUE:HGB 7.3  RECEIVER (on-site recipient of call):Sandi K, RN  DATE & TIME NOTIFIED: 04/02/23; 1040  MESSENGER (representative from lab):Maceo Pro  MD NOTIFIED: Dr. Candise Che   TIME OF NOTIFICATION:1042  RESPONSE: Info acknowledged / orders recvd for 1 unit PRBCs

## 2023-04-02 NOTE — Progress Notes (Signed)
Verbal order from Dr. Candise Che for 1 unit PRBCs tomorrow  Premeds as during previous transfusions - 650 mg po tylenol and 40 mg Solu Medrol IV.  Orders confirmed with Alexix in [facility].

## 2023-04-03 ENCOUNTER — Other Ambulatory Visit: Payer: Self-pay | Admitting: *Deleted

## 2023-04-03 ENCOUNTER — Inpatient Hospital Stay: Payer: Medicare PPO

## 2023-04-03 DIAGNOSIS — D75839 Thrombocytosis, unspecified: Secondary | ICD-10-CM | POA: Diagnosis not present

## 2023-04-03 DIAGNOSIS — D45 Polycythemia vera: Secondary | ICD-10-CM

## 2023-04-03 DIAGNOSIS — D649 Anemia, unspecified: Secondary | ICD-10-CM

## 2023-04-03 DIAGNOSIS — D696 Thrombocytopenia, unspecified: Secondary | ICD-10-CM

## 2023-04-03 DIAGNOSIS — Z7901 Long term (current) use of anticoagulants: Secondary | ICD-10-CM | POA: Diagnosis not present

## 2023-04-03 DIAGNOSIS — R109 Unspecified abdominal pain: Secondary | ICD-10-CM | POA: Diagnosis not present

## 2023-04-03 DIAGNOSIS — R519 Headache, unspecified: Secondary | ICD-10-CM | POA: Diagnosis not present

## 2023-04-03 DIAGNOSIS — Z86718 Personal history of other venous thrombosis and embolism: Secondary | ICD-10-CM | POA: Diagnosis not present

## 2023-04-03 DIAGNOSIS — Z87891 Personal history of nicotine dependence: Secondary | ICD-10-CM | POA: Diagnosis not present

## 2023-04-03 DIAGNOSIS — D7581 Myelofibrosis: Secondary | ICD-10-CM | POA: Diagnosis not present

## 2023-04-03 LAB — TYPE AND SCREEN: ABO/RH(D): O POS

## 2023-04-03 LAB — BPAM RBC

## 2023-04-03 LAB — SAMPLE TO BLOOD BANK

## 2023-04-03 MED ORDER — METHYLPREDNISOLONE SODIUM SUCC 40 MG IJ SOLR
40.0000 mg | Freq: Once | INTRAMUSCULAR | Status: AC
  Administered 2023-04-03: 40 mg via INTRAVENOUS
  Filled 2023-04-03: qty 1

## 2023-04-03 MED ORDER — SODIUM CHLORIDE 0.9% IV SOLUTION
250.0000 mL | INTRAVENOUS | Status: DC
  Administered 2023-04-03: 250 mL via INTRAVENOUS

## 2023-04-03 MED ORDER — ACETAMINOPHEN 325 MG PO TABS
650.0000 mg | ORAL_TABLET | Freq: Once | ORAL | Status: AC
  Administered 2023-04-03: 650 mg via ORAL
  Filled 2023-04-03: qty 2

## 2023-04-03 NOTE — Progress Notes (Addendum)
Pt here for 1U RBCs today, pt was unaware to keep on blue bracelet for blood, lab contacted and new sample drawn. Sample taken to blood bank and [place] verified prepare. Per Dr Candise Che ok to transfuse at 383mL/hr once blood was prepared. Upon contacting blood bank for an update it was noted that pt had antibodies they were having to do additional testing on and needed 2 additional samples and would not have blood ready until late today. Pt rescheduled for 1U RBCs 04/04/23 at 1:00pm, pt aware and agrees. Tammi, RN placed new orders for tomorrow. Lab contacted to draw two additional samples for blood bank and pt taken to lobby for lab to draw the two additional samples.

## 2023-04-04 ENCOUNTER — Inpatient Hospital Stay: Payer: Medicare PPO

## 2023-04-04 ENCOUNTER — Other Ambulatory Visit: Payer: Self-pay

## 2023-04-04 DIAGNOSIS — D45 Polycythemia vera: Secondary | ICD-10-CM | POA: Diagnosis not present

## 2023-04-04 DIAGNOSIS — D7581 Myelofibrosis: Secondary | ICD-10-CM | POA: Diagnosis not present

## 2023-04-04 DIAGNOSIS — R109 Unspecified abdominal pain: Secondary | ICD-10-CM | POA: Diagnosis not present

## 2023-04-04 DIAGNOSIS — D649 Anemia, unspecified: Secondary | ICD-10-CM | POA: Diagnosis not present

## 2023-04-04 DIAGNOSIS — Z87891 Personal history of nicotine dependence: Secondary | ICD-10-CM | POA: Diagnosis not present

## 2023-04-04 DIAGNOSIS — D75839 Thrombocytosis, unspecified: Secondary | ICD-10-CM | POA: Diagnosis not present

## 2023-04-04 DIAGNOSIS — R519 Headache, unspecified: Secondary | ICD-10-CM | POA: Diagnosis not present

## 2023-04-04 DIAGNOSIS — Z7901 Long term (current) use of anticoagulants: Secondary | ICD-10-CM | POA: Diagnosis not present

## 2023-04-04 DIAGNOSIS — Z86718 Personal history of other venous thrombosis and embolism: Secondary | ICD-10-CM | POA: Diagnosis not present

## 2023-04-04 LAB — BPAM RBC
Blood Product Expiration Date: 202406152359
Unit Type and Rh: 5100

## 2023-04-04 LAB — PREPARE RBC (CROSSMATCH)

## 2023-04-04 LAB — TYPE AND SCREEN: Unit division: 0

## 2023-04-04 MED ORDER — ACETAMINOPHEN 325 MG PO TABS
650.0000 mg | ORAL_TABLET | Freq: Once | ORAL | Status: AC
  Administered 2023-04-04: 650 mg via ORAL
  Filled 2023-04-04: qty 2

## 2023-04-04 MED ORDER — SODIUM CHLORIDE 0.9% IV SOLUTION
250.0000 mL | Freq: Once | INTRAVENOUS | Status: AC
  Administered 2023-04-04: 250 mL via INTRAVENOUS

## 2023-04-04 MED ORDER — METHYLPREDNISOLONE SODIUM SUCC 40 MG IJ SOLR
40.0000 mg | Freq: Once | INTRAMUSCULAR | Status: AC
  Administered 2023-04-04: 40 mg via INTRAVENOUS
  Filled 2023-04-04: qty 1

## 2023-04-04 NOTE — Patient Instructions (Signed)

## 2023-04-05 DIAGNOSIS — D649 Anemia, unspecified: Secondary | ICD-10-CM | POA: Diagnosis not present

## 2023-04-05 DIAGNOSIS — R109 Unspecified abdominal pain: Secondary | ICD-10-CM | POA: Diagnosis not present

## 2023-04-05 DIAGNOSIS — D75839 Thrombocytosis, unspecified: Secondary | ICD-10-CM | POA: Diagnosis not present

## 2023-04-05 DIAGNOSIS — R519 Headache, unspecified: Secondary | ICD-10-CM | POA: Diagnosis not present

## 2023-04-05 DIAGNOSIS — D45 Polycythemia vera: Secondary | ICD-10-CM | POA: Diagnosis not present

## 2023-04-05 DIAGNOSIS — D7581 Myelofibrosis: Secondary | ICD-10-CM | POA: Diagnosis not present

## 2023-04-05 DIAGNOSIS — Z7901 Long term (current) use of anticoagulants: Secondary | ICD-10-CM | POA: Diagnosis not present

## 2023-04-05 DIAGNOSIS — Z87891 Personal history of nicotine dependence: Secondary | ICD-10-CM | POA: Diagnosis not present

## 2023-04-05 DIAGNOSIS — Z86718 Personal history of other venous thrombosis and embolism: Secondary | ICD-10-CM | POA: Diagnosis not present

## 2023-04-05 LAB — TYPE AND SCREEN
Antibody Screen: POSITIVE
DAT, IgG: NEGATIVE
Unit division: 0

## 2023-04-05 LAB — BPAM RBC: ISSUE DATE / TIME: 202405161339

## 2023-04-08 ENCOUNTER — Inpatient Hospital Stay: Payer: Medicare PPO

## 2023-04-08 ENCOUNTER — Other Ambulatory Visit: Payer: Self-pay

## 2023-04-08 ENCOUNTER — Inpatient Hospital Stay (HOSPITAL_BASED_OUTPATIENT_CLINIC_OR_DEPARTMENT_OTHER): Payer: Medicare PPO | Admitting: Hematology

## 2023-04-08 VITALS — BP 106/69 | HR 81 | Temp 98.1°F | Resp 17 | Ht 70.0 in | Wt 150.4 lb

## 2023-04-08 DIAGNOSIS — Z87891 Personal history of nicotine dependence: Secondary | ICD-10-CM | POA: Diagnosis not present

## 2023-04-08 DIAGNOSIS — D696 Thrombocytopenia, unspecified: Secondary | ICD-10-CM

## 2023-04-08 DIAGNOSIS — D45 Polycythemia vera: Secondary | ICD-10-CM

## 2023-04-08 DIAGNOSIS — D7581 Myelofibrosis: Secondary | ICD-10-CM

## 2023-04-08 DIAGNOSIS — D75839 Thrombocytosis, unspecified: Secondary | ICD-10-CM | POA: Diagnosis not present

## 2023-04-08 DIAGNOSIS — Z86718 Personal history of other venous thrombosis and embolism: Secondary | ICD-10-CM | POA: Diagnosis not present

## 2023-04-08 DIAGNOSIS — R519 Headache, unspecified: Secondary | ICD-10-CM | POA: Diagnosis not present

## 2023-04-08 DIAGNOSIS — D649 Anemia, unspecified: Secondary | ICD-10-CM | POA: Insufficient documentation

## 2023-04-08 DIAGNOSIS — D619 Aplastic anemia, unspecified: Secondary | ICD-10-CM | POA: Diagnosis not present

## 2023-04-08 DIAGNOSIS — Z7901 Long term (current) use of anticoagulants: Secondary | ICD-10-CM | POA: Diagnosis not present

## 2023-04-08 DIAGNOSIS — R109 Unspecified abdominal pain: Secondary | ICD-10-CM | POA: Diagnosis not present

## 2023-04-08 LAB — CBC WITH DIFFERENTIAL (CANCER CENTER ONLY)
Abs Immature Granulocytes: 4.3 10*3/uL — ABNORMAL HIGH (ref 0.00–0.07)
Band Neutrophils: 10 %
Basophils Absolute: 0.2 10*3/uL — ABNORMAL HIGH (ref 0.0–0.1)
Basophils Relative: 1 %
Blasts: 5 %
Eosinophils Absolute: 3 10*3/uL — ABNORMAL HIGH (ref 0.0–0.5)
Eosinophils Relative: 14 %
HCT: 27.9 % — ABNORMAL LOW (ref 39.0–52.0)
Hemoglobin: 9.1 g/dL — ABNORMAL LOW (ref 13.0–17.0)
Lymphocytes Relative: 10 %
Lymphs Abs: 2.1 10*3/uL (ref 0.7–4.0)
MCH: 31.8 pg (ref 26.0–34.0)
MCHC: 32.6 g/dL (ref 30.0–36.0)
MCV: 97.6 fL (ref 80.0–100.0)
Metamyelocytes Relative: 12 %
Monocytes Absolute: 0.4 10*3/uL (ref 0.1–1.0)
Monocytes Relative: 2 %
Myelocytes: 8 %
Neutro Abs: 10.2 10*3/uL — ABNORMAL HIGH (ref 1.7–7.7)
Neutrophils Relative %: 38 %
Platelet Count: 103 10*3/uL — ABNORMAL LOW (ref 150–400)
RBC: 2.86 MIL/uL — ABNORMAL LOW (ref 4.22–5.81)
RDW: 19 % — ABNORMAL HIGH (ref 11.5–15.5)
WBC Count: 21.3 10*3/uL — ABNORMAL HIGH (ref 4.0–10.5)
nRBC: 17.4 % — ABNORMAL HIGH (ref 0.0–0.2)

## 2023-04-08 LAB — CMP (CANCER CENTER ONLY)
ALT: 11 U/L (ref 0–44)
AST: 27 U/L (ref 15–41)
Albumin: 3.9 g/dL (ref 3.5–5.0)
Alkaline Phosphatase: 183 U/L — ABNORMAL HIGH (ref 38–126)
Anion gap: 8 (ref 5–15)
BUN: 19 mg/dL (ref 8–23)
CO2: 27 mmol/L (ref 22–32)
Calcium: 9.7 mg/dL (ref 8.9–10.3)
Chloride: 103 mmol/L (ref 98–111)
Creatinine: 1.26 mg/dL — ABNORMAL HIGH (ref 0.61–1.24)
GFR, Estimated: 59 mL/min — ABNORMAL LOW (ref >60)
Glucose, Bld: 108 mg/dL — ABNORMAL HIGH (ref 70–99)
Potassium: 3.7 mmol/L (ref 3.5–5.1)
Sodium: 138 mmol/L (ref 135–145)
Total Bilirubin: 0.7 mg/dL (ref 0.3–1.2)
Total Protein: 7.6 g/dL (ref 6.5–8.1)

## 2023-04-08 LAB — RETICULOCYTES
Immature Retic Fract: 45.8 % — ABNORMAL HIGH (ref 2.3–15.9)
RBC.: 2.84 MIL/uL — ABNORMAL LOW (ref 4.22–5.81)
Retic Count, Absolute: 51.7 10*3/uL (ref 19.0–186.0)
Retic Ct Pct: 1.8 % (ref 0.4–3.1)

## 2023-04-08 LAB — SAMPLE TO BLOOD BANK

## 2023-04-08 MED ORDER — MIRTAZAPINE 15 MG PO TABS: 15.0000 mg | ORAL_TABLET | Freq: Every day | ORAL | 1 refills | Status: DC

## 2023-04-08 NOTE — Progress Notes (Signed)
HEMATOLOGY/ONCOLOGY CLINIC VISIT NOTE  Date of Service: 03/20/2023   Patient Care Team: Dillon Blamer, MD as PCP - General (Family Medicine)  CHIEF COMPLAINTS/PURPOSE OF CONSULTATION:  F/u for continued mx of Polycythemia vera and newly diagnosed post PV secondary myelofibrosis.  HISTORY OF PRESENTING ILLNESS:   Please see previous note for details on initial presentation  INTERVAL HISTORY:   Dillon Hill is a 77 y.o. male here for continued evaluation and management of Polycythemia vera.   Patient was last seen by me on 03/20/2023 and he complained of mild abdominal pain/fullness, mild muscular chest pain, and increased headaches due to sleep disturbance.     Patient is accompanied by his daughter during this visit. Patient notes he has been doing well overall since our last visit. His daughter notes that  the patient has not been able to sleep at night.   Patient complains of fatigue, occasional dizziness (light-headed), appetite loss due to abdominal fullness, and insomnia. He notes that his fatigue improved after his last blood transfusion. He has tried benadryl and melatonin, which has not helped him with insomnia.   Patient is regularly taking vitamin B-12 supplement and B-Complex supplement.   He denies fever, chills, night sweats, infection issues, unexpected weight loss, hematuria, abnormal bleeding, abdominal pain, chest pain, back pain, or leg swelling.   He regularly takes Eliquis 5 mg as prescribed without any new or severe toxicities.    MEDICAL HISTORY:  Past Medical History:  Diagnosis Date   BPH (benign prostatic hyperplasia)    Colon polyp    DDD (degenerative disc disease), cervical    High cholesterol    Migraine    Polycythemia vera (HCC)     SURGICAL HISTORY: Past Surgical History:  Procedure Laterality Date   EYE SURGERY      SOCIAL HISTORY: Social History   Socioeconomic History   Marital status: Widowed    Spouse name: Not on file    Number of children: Not on file   Years of education: Not on file   Highest education level: Not on file  Occupational History   Not on file  Tobacco Use   Smoking status: Never   Smokeless tobacco: Never   Tobacco comments:    smoked x 1 year at age 39  Vaping Use   Vaping Use: Never used  Substance and Sexual Activity   Alcohol use: Yes    Comment: occ   Drug use: Never   Sexual activity: Not on file  Other Topics Concern   Not on file  Social History Narrative   Not on file   Social Determinants of Health   Financial Resource Strain: Not on file  Food Insecurity: Not on file  Transportation Needs: Not on file  Physical Activity: Not on file  Stress: Not on file  Social Connections: Not on file  Intimate Partner Violence: Not on file    FAMILY HISTORY: No family history on file.  ALLERGIES:  has No Known Allergies.  MEDICATIONS:  Current Outpatient Medications  Medication Sig Dispense Refill   apixaban (ELIQUIS) 5 MG TABS tablet TAKE 1 TABLET(5 MG) BY MOUTH TWICE DAILY 60 tablet 2   Cyanocobalamin (B-12) 1000 MCG SUBL Place 1,000 mcg under the tongue daily. 30 tablet 5   folic acid (FOLVITE) 1 MG tablet TAKE 1 TABLET(1 MG) BY MOUTH DAILY 90 tablet 0   hydroxyurea (HYDREA) 500 MG capsule TAKE 1 CAPSULE BY MOUTH ONCE WEEKLY ON MONDAYS. MAY TAKE WITH FOOD TO  MINIMIZE GI SIDE EFFECTS 30 capsule 2   Multiple Vitamin (MULTIVITAMIN WITH MINERALS) TABS tablet Take 1 tablet by mouth daily.     No current facility-administered medications for this visit.    REVIEW OF SYSTEMS:    10 Point review of Systems was done is negative except as noted above.   PHYSICAL EXAMINATION: .BP 115/68   Pulse 84   Temp 98 F (36.7 C)   Resp 18   Wt 154 lb 14.4 oz (70.3 kg)   SpO2 100%   BMI 21.91 kg/m   GENERAL:alert, in no acute distress and comfortable SKIN: no acute rashes, no significant lesions EYES: conjunctiva are pink and non-injected, sclera anicteric OROPHARYNX:  MMM, no exudates, no oropharyngeal erythema or ulceration NECK: supple, no JVD LYMPH:  no palpable lymphadenopathy in the cervical, axillary or inguinal regions LUNGS: clear to auscultation b/l with normal respiratory effort HEART: regular rate & rhythm ABDOMEN:  normoactive bowel sounds , non tender, not distended. Extremity: no pedal edema PSYCH: alert & oriented x 3 with fluent speech NEURO: no focal motor/sensory deficits   LABORATORY DATA:  I have reviewed the data as listed  .    Latest Ref Rng & Units 04/11/2023    8:08 AM 04/08/2023    8:20 AM 04/02/2023   10:00 AM  CBC  WBC 4.0 - 10.5 K/uL 23.8  21.3  17.2   Hemoglobin 13.0 - 17.0 g/dL 8.4  9.1  7.4   Hematocrit 39.0 - 52.0 % 26.3  27.9  23.2   Platelets 150 - 400 K/uL 101  103  88     .    Latest Ref Rng & Units 04/11/2023    8:08 AM 04/08/2023    8:20 AM 04/02/2023   10:00 AM  CMP  Glucose 70 - 99 mg/dL 161  096  96   BUN 8 - 23 mg/dL 25  19  24    Creatinine 0.61 - 1.24 mg/dL 0.45  4.09  8.11   Sodium 135 - 145 mmol/L 141  138  137   Potassium 3.5 - 5.1 mmol/L 3.9  3.7  4.1   Chloride 98 - 111 mmol/L 105  103  105   CO2 22 - 32 mmol/L 28  27  25    Calcium 8.9 - 10.3 mg/dL 9.4  9.7  9.5   Total Protein 6.5 - 8.1 g/dL 7.7  7.6  7.8   Total Bilirubin 0.3 - 1.2 mg/dL 0.6  0.7  0.8   Alkaline Phos 38 - 126 U/L 176  183  168   AST 15 - 41 U/L 24  27  27    ALT 0 - 44 U/L 11  11  9              01/19/2020 [id number] Flow Pathology (BM Bx):   01/19/2020 [id number] BM Bx Report:    10/24/2019 JAK2 mutation testing:    RADIOGRAPHIC STUDIES: I have personally reviewed the radiological images as listed and agreed with the findings in the report. CT BONE MARROW BIOPSY & ASPIRATION  Result Date: 03/14/2023 INDICATION: Evaluate for leukemic transformation versus secondary myelofibrosis. Please perform CT-guided bone marrow biopsy for tissue diagnostic purposes. EXAM: CT-GUIDED BONE MARROW BIOPSY AND ASPIRATION  MEDICATIONS: None ANESTHESIA/SEDATION: Moderate (conscious) sedation was employed during this procedure as administered by the Interventional Radiology RN. A total of Versed 2 mg and Fentanyl 100 mcg was administered intravenously. Moderate Sedation Time: 10 minutes. The patient's level of consciousness and vital signs were monitored  continuously by radiology nursing throughout the procedure under my direct supervision. COMPLICATIONS: None immediate. PROCEDURE: Informed consent was obtained from the patient following an explanation of the procedure, risks, benefits and alternatives. The patient understands, agrees and consents for the procedure. All questions were addressed. A time out was performed prior to the initiation of the procedure. The patient was positioned prone and non-contrast localization CT was performed of the pelvis to demonstrate the iliac marrow spaces. The operative site was prepped and draped in the usual sterile fashion. Under sterile conditions and local anesthesia, a 22 gauge spinal needle was utilized for procedural planning. Next, an 11 gauge coaxial bone biopsy needle was advanced into the left iliac marrow space. Needle position was confirmed with CT imaging. Initially, a bone marrow aspiration was attempted however yielded no significant aspirate. Next, a bone marrow biopsy was obtained with the 11 gauge outer bone marrow device. Given lack of a significant bone marrow aspirate, the 11 gauge coaxial bone biopsy needle was re-advanced into a slightly different location within the left iliac marrow space, positioning was confirmed with CT imaging and an additional bone marrow biopsy was obtained. The needle was removed and superficial hemostasis was obtained with manual compression. A dressing was applied. The patient tolerated the procedure well without immediate post procedural complication. IMPRESSION: Successful CT guided left iliac bone marrow biopsy. As detailed above, no marrow was  able to be aspirated during today's CT-guided biopsy. Electronically Signed   By: Simonne Come M.D.   On: 03/14/2023 14:13    ASSESSMENT & PLAN:   #1  JAK2 positive polycythemia vera  severe thrombocytosis with platelets of 1005k in diagnosis 10/24/2019 JAK2 GenotypR is "POSITIVE for the detection of the V617F mutation"  #2  History of Dural venous sinus thrombosis -likely triggered by his polycythemia and thrombocytosis.  Evaluation   01/19/2020 BM Bx ([id number]) revealed "BONE MARROW, ASPIRATE, CLOT, CORE:  -  Normocellular marrow with megakaryocytic hyperplasia and atypia  -  Documented JAK-2 (V617F) mutation. PERIPHERAL BLOOD: -  Pancytopenia."  PLAN:  -Discussed lab results from today, 04/08/2023, with the patient. CBC shows elevated WBC at 21.3 K, decreased hemoglobin at 9.1 g/dL, decreased hematocrit at 27.9%, and decreased platelets at 103 K. CMP shows elevated creatinine at 1.26 and elevated Alkaline Phosphate level at 183.  -Genetic results (MPN panel) are still pending.  -Discussed Ultrasound of Abdomen results from 04/02/2023 with the patient. Showed Possible peripancreatic adenopathy. Np splenomegaly. It did show small lymph node near the pancreas.  -Discussed that the small lymph node near the pancreas is probably due to extramedullary hematopoiesis. -Discussed the option of CT scan due to small lymph node near the pancreas.  -no acute leukumia found in bone marrow at this time. -Will prescribe Remaron 15 mg at bedtime to help the patient with insomnia and improve his appetite.  -Discussed the option of getting referred to [facility], [place], or [facility]  for additional opinion for any [place] clinical trials or discussion of bone marrow transplant. Patient agrees.  -Will send referral to [facility].   -Discussed the option to start Jakafi. -discussed the option of supportive care such as occasional blood transfusion or medication to address anemia. Patient agrees with the patient.   -Discussed the option of retacrit injection every two weeks. Patient agrees.  -Discussed that the goal is to keep hemoglobin around 9-10 g/dL.  -Patient does not need blood transfusion during this visit because hemoglobin is above 8 g/dL.  -Continue B-12 supplement and  B-complex supplement.  -Continue to hold hydroxyurea.  -Continue Eliquis 5 mg p.o. daily.     FOLLOW-UP: Plz schedule for Labs, 1 unit of PRBC and Retacrit every 2 weeks x 6 MD visit in 4 weeks Referral to Ardelle Anton at [facility] for 2nd opinion   The total time spent in the appointment was 32 minutes* .  All of the patient's questions were answered with apparent satisfaction. The patient knows to call the clinic with any problems, questions or concerns.   Wyvonnia Lora MD MS AAHIVMS [facility] Hematology/Oncology Physician [facility]  .*Total Encounter Time as defined by the Centers for Medicare and Medicaid Services includes, in addition to the face-to-face time of a patient visit (documented in the note above) non-face-to-face time: obtaining and reviewing outside history, ordering and reviewing medications, tests or procedures, care coordination (communications with other health care professionals or caregivers) and documentation in the medical record.   I, Ok Edwards, am acting as a Neurosurgeon for Wyvonnia Lora, MD.  .I have reviewed the above documentation for accuracy and completeness, and I agree with the above. Johney Maine MD

## 2023-04-09 ENCOUNTER — Telehealth: Payer: Self-pay | Admitting: Hematology

## 2023-04-10 ENCOUNTER — Other Ambulatory Visit: Payer: Self-pay

## 2023-04-10 DIAGNOSIS — D45 Polycythemia vera: Secondary | ICD-10-CM

## 2023-04-11 ENCOUNTER — Inpatient Hospital Stay: Payer: Medicare PPO

## 2023-04-11 ENCOUNTER — Encounter: Payer: Medicare PPO | Admitting: Dietician

## 2023-04-11 DIAGNOSIS — D45 Polycythemia vera: Secondary | ICD-10-CM | POA: Diagnosis not present

## 2023-04-11 DIAGNOSIS — Z86718 Personal history of other venous thrombosis and embolism: Secondary | ICD-10-CM | POA: Diagnosis not present

## 2023-04-11 DIAGNOSIS — R519 Headache, unspecified: Secondary | ICD-10-CM | POA: Diagnosis not present

## 2023-04-11 DIAGNOSIS — Z87891 Personal history of nicotine dependence: Secondary | ICD-10-CM | POA: Diagnosis not present

## 2023-04-11 DIAGNOSIS — D649 Anemia, unspecified: Secondary | ICD-10-CM | POA: Diagnosis not present

## 2023-04-11 DIAGNOSIS — D75839 Thrombocytosis, unspecified: Secondary | ICD-10-CM | POA: Diagnosis not present

## 2023-04-11 DIAGNOSIS — Z7901 Long term (current) use of anticoagulants: Secondary | ICD-10-CM | POA: Diagnosis not present

## 2023-04-11 DIAGNOSIS — R109 Unspecified abdominal pain: Secondary | ICD-10-CM | POA: Diagnosis not present

## 2023-04-11 DIAGNOSIS — D7581 Myelofibrosis: Secondary | ICD-10-CM | POA: Diagnosis not present

## 2023-04-11 DIAGNOSIS — D696 Thrombocytopenia, unspecified: Secondary | ICD-10-CM

## 2023-04-11 LAB — CBC WITH DIFFERENTIAL (CANCER CENTER ONLY)
Abs Immature Granulocytes: 5.61 10*3/uL — ABNORMAL HIGH (ref 0.00–0.07)
Basophils Absolute: 0.2 10*3/uL — ABNORMAL HIGH (ref 0.0–0.1)
Basophils Relative: 1 %
Eosinophils Absolute: 2 10*3/uL — ABNORMAL HIGH (ref 0.0–0.5)
Eosinophils Relative: 8 %
HCT: 26.3 % — ABNORMAL LOW (ref 39.0–52.0)
Hemoglobin: 8.4 g/dL — ABNORMAL LOW (ref 13.0–17.0)
Immature Granulocytes: 24 %
Lymphocytes Relative: 8 %
Lymphs Abs: 1.9 10*3/uL (ref 0.7–4.0)
MCH: 31.7 pg (ref 26.0–34.0)
MCHC: 31.9 g/dL (ref 30.0–36.0)
MCV: 99.2 fL (ref 80.0–100.0)
Monocytes Absolute: 2.9 10*3/uL — ABNORMAL HIGH (ref 0.1–1.0)
Monocytes Relative: 12 %
Neutro Abs: 11.3 10*3/uL — ABNORMAL HIGH (ref 1.7–7.7)
Neutrophils Relative %: 47 %
Platelet Count: 101 10*3/uL — ABNORMAL LOW (ref 150–400)
RBC: 2.65 MIL/uL — ABNORMAL LOW (ref 4.22–5.81)
RDW: 19.4 % — ABNORMAL HIGH (ref 11.5–15.5)
WBC Count: 23.8 10*3/uL — ABNORMAL HIGH (ref 4.0–10.5)
WBC Morphology: 3
nRBC: 12.9 % — ABNORMAL HIGH (ref 0.0–0.2)

## 2023-04-11 LAB — CMP (CANCER CENTER ONLY)
ALT: 11 U/L (ref 0–44)
AST: 24 U/L (ref 15–41)
Albumin: 4 g/dL (ref 3.5–5.0)
Alkaline Phosphatase: 176 U/L — ABNORMAL HIGH (ref 38–126)
Anion gap: 8 (ref 5–15)
BUN: 25 mg/dL — ABNORMAL HIGH (ref 8–23)
CO2: 28 mmol/L (ref 22–32)
Calcium: 9.4 mg/dL (ref 8.9–10.3)
Chloride: 105 mmol/L (ref 98–111)
Creatinine: 1.5 mg/dL — ABNORMAL HIGH (ref 0.61–1.24)
GFR, Estimated: 48 mL/min — ABNORMAL LOW (ref >60)
Glucose, Bld: 117 mg/dL — ABNORMAL HIGH (ref 70–99)
Potassium: 3.9 mmol/L (ref 3.5–5.1)
Sodium: 141 mmol/L (ref 135–145)
Total Bilirubin: 0.6 mg/dL (ref 0.3–1.2)
Total Protein: 7.7 g/dL (ref 6.5–8.1)

## 2023-04-11 LAB — LACTATE DEHYDROGENASE: LDH: 733 U/L — ABNORMAL HIGH (ref 98–192)

## 2023-04-11 LAB — RETICULOCYTES
Immature Retic Fract: 43.8 % — ABNORMAL HIGH (ref 2.3–15.9)
RBC.: 2.73 MIL/uL — ABNORMAL LOW (ref 4.22–5.81)
Retic Count, Absolute: 63.1 10*3/uL (ref 19.0–186.0)
Retic Ct Pct: 2.3 % (ref 0.4–3.1)

## 2023-04-11 LAB — SAMPLE TO BLOOD BANK

## 2023-04-13 LAB — ERYTHROPOIETIN: Erythropoietin: 44.2 m[IU]/mL — ABNORMAL HIGH (ref 2.6–18.5)

## 2023-04-14 ENCOUNTER — Encounter: Payer: Self-pay | Admitting: Hematology

## 2023-04-16 LAB — MYELOID PANEL NGS

## 2023-04-18 ENCOUNTER — Ambulatory Visit (HOSPITAL_COMMUNITY)
Admission: RE | Admit: 2023-04-18 | Discharge: 2023-04-18 | Disposition: A | Discharge status: Home / Self Care | Payer: Medicare PPO | Source: Ambulatory Visit | Attending: Hematology | Admitting: Hematology

## 2023-04-18 DIAGNOSIS — D7581 Myelofibrosis: Secondary | ICD-10-CM | POA: Diagnosis not present

## 2023-04-18 DIAGNOSIS — K5730 Diverticulosis of large intestine without perforation or abscess without bleeding: Secondary | ICD-10-CM | POA: Diagnosis not present

## 2023-04-18 DIAGNOSIS — D45 Polycythemia vera: Secondary | ICD-10-CM | POA: Insufficient documentation

## 2023-04-18 MED ORDER — IOHEXOL 300 MG/ML  SOLN
100.0000 mL | Freq: Once | INTRAMUSCULAR | Status: AC | PRN
  Administered 2023-04-18: 100 mL via INTRAVENOUS

## 2023-04-18 MED ORDER — SODIUM CHLORIDE (PF) 0.9 % IJ SOLN
INTRAMUSCULAR | Status: AC
  Filled 2023-04-18: qty 50

## 2023-04-22 ENCOUNTER — Inpatient Hospital Stay: Payer: Medicare PPO

## 2023-04-22 ENCOUNTER — Other Ambulatory Visit: Payer: Self-pay

## 2023-04-22 ENCOUNTER — Inpatient Hospital Stay: Payer: Medicare PPO | Attending: Hematology

## 2023-04-22 ENCOUNTER — Other Ambulatory Visit: Payer: Self-pay | Admitting: Hematology

## 2023-04-22 VITALS — BP 102/66 | HR 59 | Temp 97.9°F | Resp 18 | Wt 151.0 lb

## 2023-04-22 DIAGNOSIS — Z86010 Personal history of colonic polyps: Secondary | ICD-10-CM | POA: Insufficient documentation

## 2023-04-22 DIAGNOSIS — D649 Anemia, unspecified: Secondary | ICD-10-CM

## 2023-04-22 DIAGNOSIS — D45 Polycythemia vera: Secondary | ICD-10-CM | POA: Insufficient documentation

## 2023-04-22 DIAGNOSIS — D7581 Myelofibrosis: Secondary | ICD-10-CM | POA: Diagnosis not present

## 2023-04-22 DIAGNOSIS — G4700 Insomnia, unspecified: Secondary | ICD-10-CM | POA: Diagnosis not present

## 2023-04-22 DIAGNOSIS — R5383 Other fatigue: Secondary | ICD-10-CM | POA: Insufficient documentation

## 2023-04-22 DIAGNOSIS — D619 Aplastic anemia, unspecified: Secondary | ICD-10-CM

## 2023-04-22 DIAGNOSIS — D473 Essential (hemorrhagic) thrombocythemia: Secondary | ICD-10-CM | POA: Insufficient documentation

## 2023-04-22 DIAGNOSIS — D75839 Thrombocytosis, unspecified: Secondary | ICD-10-CM | POA: Diagnosis not present

## 2023-04-22 DIAGNOSIS — D696 Thrombocytopenia, unspecified: Secondary | ICD-10-CM

## 2023-04-22 LAB — CBC WITH DIFFERENTIAL (CANCER CENTER ONLY)
Abs Immature Granulocytes: 4.63 10*3/uL — ABNORMAL HIGH (ref 0.00–0.07)
Basophils Absolute: 0.2 10*3/uL — ABNORMAL HIGH (ref 0.0–0.1)
Basophils Relative: 1 %
Eosinophils Absolute: 1.8 10*3/uL — ABNORMAL HIGH (ref 0.0–0.5)
Eosinophils Relative: 8 %
HCT: 24 % — ABNORMAL LOW (ref 39.0–52.0)
Hemoglobin: 7.6 g/dL — ABNORMAL LOW (ref 13.0–17.0)
Immature Granulocytes: 21 %
Lymphocytes Relative: 8 %
Lymphs Abs: 1.7 10*3/uL (ref 0.7–4.0)
MCH: 31.3 pg (ref 26.0–34.0)
MCHC: 31.7 g/dL (ref 30.0–36.0)
MCV: 98.8 fL (ref 80.0–100.0)
Monocytes Absolute: 2.9 10*3/uL — ABNORMAL HIGH (ref 0.1–1.0)
Monocytes Relative: 13 %
Neutro Abs: 10.6 10*3/uL — ABNORMAL HIGH (ref 1.7–7.7)
Neutrophils Relative %: 49 %
Platelet Count: 97 10*3/uL — ABNORMAL LOW (ref 150–400)
RBC: 2.43 MIL/uL — ABNORMAL LOW (ref 4.22–5.81)
RDW: 19.4 % — ABNORMAL HIGH (ref 11.5–15.5)
Smear Review: NORMAL
WBC Count: 21.7 10*3/uL — ABNORMAL HIGH (ref 4.0–10.5)
nRBC: 13.8 % — ABNORMAL HIGH (ref 0.0–0.2)

## 2023-04-22 LAB — RETICULOCYTES
Immature Retic Fract: 42.4 % — ABNORMAL HIGH (ref 2.3–15.9)
RBC.: 2.43 MIL/uL — ABNORMAL LOW (ref 4.22–5.81)
Retic Count, Absolute: 43 10*3/uL (ref 19.0–186.0)
Retic Ct Pct: 1.8 % (ref 0.4–3.1)

## 2023-04-22 LAB — SAMPLE TO BLOOD BANK

## 2023-04-22 LAB — TYPE AND SCREEN
ABO/RH(D): O POS
Antibody Screen: POSITIVE

## 2023-04-22 LAB — BPAM RBC: Blood Product Expiration Date: 202407082359

## 2023-04-22 LAB — PREPARE RBC (CROSSMATCH)

## 2023-04-22 LAB — LACTATE DEHYDROGENASE: LDH: 799 U/L — ABNORMAL HIGH (ref 98–192)

## 2023-04-22 MED ORDER — SODIUM CHLORIDE 0.9% IV SOLUTION
250.0000 mL | Freq: Once | INTRAVENOUS | Status: AC
  Administered 2023-04-22: 250 mL via INTRAVENOUS

## 2023-04-22 MED ORDER — ACETAMINOPHEN 325 MG PO TABS: 650.0000 mg | ORAL_TABLET | Freq: Once | ORAL | Status: DC

## 2023-04-22 MED ORDER — METHYLPREDNISOLONE SODIUM SUCC 40 MG IJ SOLR: 40.0000 mg | Freq: Once | INTRAMUSCULAR | Status: DC

## 2023-04-22 MED ORDER — EPOETIN ALFA-EPBX 20000 UNIT/ML IJ SOLN
20000.0000 [IU] | Freq: Once | INTRAMUSCULAR | Status: AC
  Administered 2023-04-22: 20000 [IU] via SUBCUTANEOUS
  Filled 2023-04-22: qty 1

## 2023-04-22 NOTE — Progress Notes (Signed)
Patient has antibodies. Per BB they will need at least 3 hours to type and screen and prepare blood for transfusion. Patient received injection as scheduled for today. Patient will return tomorrow at [facility] for transfusion. He understands to return with blue BB braclet on for tomorrow's appt.

## 2023-04-22 NOTE — Patient Instructions (Signed)

## 2023-04-23 ENCOUNTER — Inpatient Hospital Stay: Payer: Medicare PPO

## 2023-04-23 DIAGNOSIS — D45 Polycythemia vera: Secondary | ICD-10-CM | POA: Diagnosis not present

## 2023-04-23 DIAGNOSIS — Z86010 Personal history of colonic polyps: Secondary | ICD-10-CM | POA: Diagnosis not present

## 2023-04-23 DIAGNOSIS — D619 Aplastic anemia, unspecified: Secondary | ICD-10-CM

## 2023-04-23 DIAGNOSIS — G4700 Insomnia, unspecified: Secondary | ICD-10-CM | POA: Diagnosis not present

## 2023-04-23 DIAGNOSIS — D473 Essential (hemorrhagic) thrombocythemia: Secondary | ICD-10-CM | POA: Diagnosis not present

## 2023-04-23 DIAGNOSIS — D7581 Myelofibrosis: Secondary | ICD-10-CM | POA: Diagnosis not present

## 2023-04-23 DIAGNOSIS — R5383 Other fatigue: Secondary | ICD-10-CM | POA: Diagnosis not present

## 2023-04-23 DIAGNOSIS — D75839 Thrombocytosis, unspecified: Secondary | ICD-10-CM | POA: Diagnosis not present

## 2023-04-23 LAB — TYPE AND SCREEN: DAT, IgG: POSITIVE

## 2023-04-23 LAB — BPAM RBC: Blood Product Expiration Date: 202407082359

## 2023-04-23 MED ORDER — ACETAMINOPHEN 325 MG PO TABS
650.0000 mg | ORAL_TABLET | Freq: Once | ORAL | Status: AC
  Administered 2023-04-23: 650 mg via ORAL
  Filled 2023-04-23: qty 2

## 2023-04-23 MED ORDER — SODIUM CHLORIDE 0.9% IV SOLUTION
250.0000 mL | Freq: Once | INTRAVENOUS | Status: AC
  Administered 2023-04-23: 250 mL via INTRAVENOUS

## 2023-04-23 MED ORDER — METHYLPREDNISOLONE SODIUM SUCC 40 MG IJ SOLR
40.0000 mg | Freq: Once | INTRAMUSCULAR | Status: AC
  Administered 2023-04-23: 40 mg via INTRAVENOUS
  Filled 2023-04-23: qty 1

## 2023-04-23 NOTE — Patient Instructions (Signed)

## 2023-04-24 LAB — TYPE AND SCREEN: Unit division: 0

## 2023-04-24 LAB — BPAM RBC: ISSUE DATE / TIME: 202406040935

## 2023-05-03 ENCOUNTER — Other Ambulatory Visit: Payer: Self-pay

## 2023-05-03 DIAGNOSIS — D619 Aplastic anemia, unspecified: Secondary | ICD-10-CM

## 2023-05-06 ENCOUNTER — Inpatient Hospital Stay: Payer: Medicare PPO

## 2023-05-06 ENCOUNTER — Telehealth: Payer: Self-pay | Admitting: Hematology

## 2023-05-06 ENCOUNTER — Other Ambulatory Visit: Payer: Self-pay

## 2023-05-06 ENCOUNTER — Inpatient Hospital Stay (HOSPITAL_BASED_OUTPATIENT_CLINIC_OR_DEPARTMENT_OTHER): Payer: Medicare PPO | Admitting: Hematology

## 2023-05-06 VITALS — BP 111/82 | HR 75 | Temp 98.6°F | Resp 17 | Ht 70.0 in | Wt 145.6 lb

## 2023-05-06 DIAGNOSIS — D45 Polycythemia vera: Secondary | ICD-10-CM

## 2023-05-06 DIAGNOSIS — D7581 Myelofibrosis: Secondary | ICD-10-CM | POA: Diagnosis not present

## 2023-05-06 DIAGNOSIS — G4700 Insomnia, unspecified: Secondary | ICD-10-CM | POA: Diagnosis not present

## 2023-05-06 DIAGNOSIS — D473 Essential (hemorrhagic) thrombocythemia: Secondary | ICD-10-CM | POA: Diagnosis not present

## 2023-05-06 DIAGNOSIS — R5383 Other fatigue: Secondary | ICD-10-CM | POA: Diagnosis not present

## 2023-05-06 DIAGNOSIS — D619 Aplastic anemia, unspecified: Secondary | ICD-10-CM

## 2023-05-06 DIAGNOSIS — Z86010 Personal history of colonic polyps: Secondary | ICD-10-CM | POA: Diagnosis not present

## 2023-05-06 DIAGNOSIS — D75839 Thrombocytosis, unspecified: Secondary | ICD-10-CM | POA: Diagnosis not present

## 2023-05-06 LAB — CBC WITH DIFFERENTIAL (CANCER CENTER ONLY)
Abs Immature Granulocytes: 6.71 10*3/uL — ABNORMAL HIGH (ref 0.00–0.07)
Basophils Absolute: 0.3 10*3/uL — ABNORMAL HIGH (ref 0.0–0.1)
Basophils Relative: 1 %
Eosinophils Absolute: 2 10*3/uL — ABNORMAL HIGH (ref 0.0–0.5)
Eosinophils Relative: 7 %
HCT: 23 % — ABNORMAL LOW (ref 39.0–52.0)
Hemoglobin: 7.5 g/dL — ABNORMAL LOW (ref 13.0–17.0)
Immature Granulocytes: 24 %
Lymphocytes Relative: 6 %
Lymphs Abs: 1.6 10*3/uL (ref 0.7–4.0)
MCH: 31.9 pg (ref 26.0–34.0)
MCHC: 32.6 g/dL (ref 30.0–36.0)
MCV: 97.9 fL (ref 80.0–100.0)
Monocytes Absolute: 3.6 10*3/uL — ABNORMAL HIGH (ref 0.1–1.0)
Monocytes Relative: 13 %
Neutro Abs: 14 10*3/uL — ABNORMAL HIGH (ref 1.7–7.7)
Neutrophils Relative %: 49 %
Platelet Count: 90 10*3/uL — ABNORMAL LOW (ref 150–400)
RBC: 2.35 MIL/uL — ABNORMAL LOW (ref 4.22–5.81)
RDW: 19.3 % — ABNORMAL HIGH (ref 11.5–15.5)
Smear Review: NORMAL
WBC Count: 28.2 10*3/uL — ABNORMAL HIGH (ref 4.0–10.5)
nRBC: 15 % — ABNORMAL HIGH (ref 0.0–0.2)

## 2023-05-06 LAB — CMP (CANCER CENTER ONLY)
ALT: 10 U/L (ref 0–44)
AST: 29 U/L (ref 15–41)
Albumin: 3.7 g/dL (ref 3.5–5.0)
Alkaline Phosphatase: 210 U/L — ABNORMAL HIGH (ref 38–126)
Anion gap: 7 (ref 5–15)
BUN: 18 mg/dL (ref 8–23)
CO2: 26 mmol/L (ref 22–32)
Calcium: 9.6 mg/dL (ref 8.9–10.3)
Chloride: 105 mmol/L (ref 98–111)
Creatinine: 1.3 mg/dL — ABNORMAL HIGH (ref 0.61–1.24)
GFR, Estimated: 57 mL/min — ABNORMAL LOW (ref >60)
Glucose, Bld: 100 mg/dL — ABNORMAL HIGH (ref 70–99)
Potassium: 4.2 mmol/L (ref 3.5–5.1)
Sodium: 138 mmol/L (ref 135–145)
Total Bilirubin: 0.8 mg/dL (ref 0.3–1.2)
Total Protein: 7 g/dL (ref 6.5–8.1)

## 2023-05-06 LAB — TYPE AND SCREEN: Antibody Screen: POSITIVE

## 2023-05-06 LAB — RETICULOCYTES
Immature Retic Fract: 38.2 % — ABNORMAL HIGH (ref 2.3–15.9)
RBC.: 2.41 MIL/uL — ABNORMAL LOW (ref 4.22–5.81)
Retic Count, Absolute: 45.5 10*3/uL (ref 19.0–186.0)
Retic Ct Pct: 1.9 % (ref 0.4–3.1)

## 2023-05-06 LAB — SAMPLE TO BLOOD BANK

## 2023-05-06 LAB — PREPARE RBC (CROSSMATCH)

## 2023-05-06 LAB — LACTATE DEHYDROGENASE: LDH: 827 U/L — ABNORMAL HIGH (ref 98–192)

## 2023-05-06 MED ORDER — LORAZEPAM 0.5 MG PO TABS
0.5000 mg | ORAL_TABLET | Freq: Every evening | ORAL | 0 refills | Status: DC | PRN
Start: 2023-05-06 — End: 2023-07-18

## 2023-05-06 MED ORDER — TRAZODONE HCL 100 MG PO TABS: 100.0000 mg | ORAL_TABLET | Freq: Every day | ORAL | 1 refills | Status: DC

## 2023-05-06 NOTE — Progress Notes (Signed)
HEMATOLOGY/ONCOLOGY CLINIC VISIT NOTE  Date of Service: 05/06/2023   Patient Care Team: Noberto Retort, MD as PCP - General (Family Medicine)  CHIEF COMPLAINTS/PURPOSE OF CONSULTATION:  F/u for continued mx of Polycythemia vera and newly diagnosed post PV secondary myelofibrosis.  HISTORY OF PRESENTING ILLNESS:   Please see previous note for details on initial presentation  INTERVAL HISTORY:   Dillon Hill is a 77 y.o. male here for continued evaluation and management of Polycythemia vera.   Patient was last seen by me on 04/08/2023 and he complained of fatigue, occasional dizziness, appetite loss from abdominal fullness, and insomnia.  Today, he complains of insomnia and Remaron made him worse. He currently takes OTC medication called Relaxium. He notes he gets about 3-4 hours of sleep at a time. He also complains of fatigue.   Patient notes he does not have much trouble urinating.  He denies fever, chills, night sweats, infection issues, unexpected weight loss, flushing/redness of the hands, hematuria, abnormal bleeding, abdominal pain, chest pain, back pain, or leg swelling. Patient report occasional rashes along the bilateral UE.  He regularly takes Eliquis 5 mg as prescribed without any new or severe toxicities.   MEDICAL HISTORY:  Past Medical History:  Diagnosis Date   BPH (benign prostatic hyperplasia)    Colon polyp    DDD (degenerative disc disease), cervical    High cholesterol    Migraine    Polycythemia vera (HCC)     SURGICAL HISTORY: Past Surgical History:  Procedure Laterality Date   EYE SURGERY      SOCIAL HISTORY: Social History   Socioeconomic History   Marital status: Widowed    Spouse name: Not on file   Number of children: Not on file   Years of education: Not on file   Highest education level: Not on file  Occupational History   Not on file  Tobacco Use   Smoking status: Never   Smokeless tobacco: Never   Tobacco comments:     smoked x 1 year at age 43  Vaping Use   Vaping Use: Never used  Substance and Sexual Activity   Alcohol use: Yes    Comment: occ   Drug use: Never   Sexual activity: Not on file  Other Topics Concern   Not on file  Social History Narrative   Not on file   Social Determinants of Health   Financial Resource Strain: Not on file  Food Insecurity: Not on file  Transportation Needs: Not on file  Physical Activity: Not on file  Stress: Not on file  Social Connections: Not on file  Intimate Partner Violence: Not on file    FAMILY HISTORY: No family history on file.  ALLERGIES:  has No Known Allergies.  MEDICATIONS:  Current Outpatient Medications  Medication Sig Dispense Refill   apixaban (ELIQUIS) 5 MG TABS tablet TAKE 1 TABLET(5 MG) BY MOUTH TWICE DAILY 60 tablet 2   Cyanocobalamin (B-12) 1000 MCG SUBL Place 1,000 mcg under the tongue daily. 30 tablet 5   folic acid (FOLVITE) 1 MG tablet TAKE 1 TABLET(1 MG) BY MOUTH DAILY 90 tablet 0   hydroxyurea (HYDREA) 500 MG capsule TAKE 1 CAPSULE BY MOUTH ONCE WEEKLY ON MONDAYS. MAY TAKE WITH FOOD TO MINIMIZE GI SIDE EFFECTS 30 capsule 2   mirtazapine (REMERON) 15 MG tablet Take 1 tablet (15 mg total) by mouth at bedtime. 30 tablet 1   Multiple Vitamin (MULTIVITAMIN WITH MINERALS) TABS tablet Take 1 tablet by mouth  daily.     No current facility-administered medications for this visit.    REVIEW OF SYSTEMS:    10 Point review of Systems was done is negative except as noted above.   PHYSICAL EXAMINATION: .BP 111/82 (BP Location: Left Arm, Patient Position: Sitting)   Pulse 75   Temp 98.6 F (37 C) (Temporal)   Resp 17   Ht 5\' 10"  (1.778 m)   Wt 145 lb 9 oz (66 kg)   SpO2 99%   BMI 20.89 kg/m   GENERAL:alert, in no acute distress and comfortable SKIN: no acute rashes, no significant lesions EYES: conjunctiva are pink and non-injected, sclera anicteric OROPHARYNX: MMM, no exudates, no oropharyngeal erythema or  ulceration NECK: supple, no JVD LYMPH:  no palpable lymphadenopathy in the cervical, axillary or inguinal regions LUNGS: clear to auscultation b/l with normal respiratory effort HEART: regular rate & rhythm ABDOMEN:  normoactive bowel sounds , non tender, not distended. Extremity: no pedal edema PSYCH: alert & oriented x 3 with fluent speech NEURO: no focal motor/sensory deficits   LABORATORY DATA:  I have reviewed the data as listed  .    Latest Ref Rng & Units 05/06/2023   12:59 PM 04/22/2023    8:03 AM 04/11/2023    8:08 AM  CBC  WBC 4.0 - 10.5 K/uL 28.2  21.7  23.8   Hemoglobin 13.0 - 17.0 g/dL 7.5  7.6  8.4   Hematocrit 39.0 - 52.0 % 23.0  24.0  26.3   Platelets 150 - 400 K/uL 90  97  101     .    Latest Ref Rng & Units 05/06/2023   12:59 PM 04/11/2023    8:08 AM 04/08/2023    8:20 AM  CMP  Glucose 70 - 99 mg/dL 829  562  130   BUN 8 - 23 mg/dL 18  25  19    Creatinine 0.61 - 1.24 mg/dL 8.65  7.84  6.96   Sodium 135 - 145 mmol/L 138  141  138   Potassium 3.5 - 5.1 mmol/L 4.2  3.9  3.7   Chloride 98 - 111 mmol/L 105  105  103   CO2 22 - 32 mmol/L 26  28  27    Calcium 8.9 - 10.3 mg/dL 9.6  9.4  9.7   Total Protein 6.5 - 8.1 g/dL 7.0  7.7  7.6   Total Bilirubin 0.3 - 1.2 mg/dL 0.8  0.6  0.7   Alkaline Phos 38 - 126 U/L 210  176  183   AST 15 - 41 U/L 29  24  27    ALT 0 - 44 U/L 10  11  11              01/19/2020 [id number] Flow Pathology (BM Bx):   01/19/2020 [id number] BM Bx Report:    10/24/2019 JAK2 mutation testing:    RADIOGRAPHIC STUDIES: I have personally reviewed the radiological images as listed and agreed with the findings in the report. CT Abdomen Pelvis W Wo Contrast  Result Date: 04/22/2023 CLINICAL DATA:  History of post polycythemia vera secondary myelofibrosis with peripancreatic findings on prior ultrasound examination EXAM: CT ABDOMEN AND PELVIS WITHOUT AND WITH CONTRAST TECHNIQUE: Multidetector CT imaging of the abdomen and pelvis was  performed following the standard protocol before and following the bolus administration of intravenous contrast. RADIATION DOSE REDUCTION: This exam was performed according to the departmental dose-optimization program which includes automated exposure control, adjustment of the mA and/or kV according to patient  size and/or use of iterative reconstruction technique. CONTRAST:  OMNIPAQUE IOHEXOL 300 MG/ML  SOLN COMPARISON:  Ultrasound abdomen dated 04/02/2023 FINDINGS: Lower chest: 3 mm right lower lobe perifissural nodule (12:10). No specific follow-up imaging recommended. No pleural effusion or pneumothorax demonstrated. Partially imaged heart size is normal. Hepatobiliary: No focal hepatic lesions. No intra or extrahepatic biliary ductal dilation. Normal gallbladder. Pancreas: Ovoid hypoattenuating foci within the pancreatic neck measuring 1.2 cm (4:45) and body/tail measuring 1.3 cm (4:43) without appreciable enhancement. No main ductal dilation. Spleen: Normal in size without focal abnormality. Adrenals/Urinary Tract: No adrenal nodules. No suspicious renal mass, calculi or hydronephrosis. Subcentimeter right renal hypodensities, too small to characterize but likely cysts. No specific follow-up imaging recommended. No focal bladder wall thickening. Stomach/Bowel: Normal appearance of the stomach. No evidence of bowel wall thickening, distention, or inflammatory changes. Colonic diverticulosis without acute diverticulitis. Appendix is not discretely seen. Vascular/Lymphatic: Aortic atherosclerosis. No enlarged abdominal or pelvic lymph nodes. Queried finding may correspond to 5 mm peripancreatic node (4:38). Reproductive: Enlargement of the prostate with median lobe hypertrophy. Other: No free fluid, fluid collection, or free air. Musculoskeletal: No acute or abnormal lytic or blastic osseous lesions. Multilevel degenerative changes of the partially imaged thoracic and lumbar spine. IMPRESSION: 1. Queried  finding likely corresponds to nonenlarged 5 mm peripancreatic lymph node. No enlarged abdominal or pelvic lymph nodes. 2. Ovoid hypoattenuating foci within the pancreatic neck measuring 1.2 cm and body/tail measuring 1.3 cm without appreciable enhancement, may represent side branch IPMNs. Consider further characterization with pre and post-contrast MRI/MRCP as clinically indicated. 3. Enlargement of the prostate with median lobe hypertrophy. 4.  Aortic Atherosclerosis (ICD10-I70.0). Electronically Signed   By: Agustin Cree M.D.   On: 04/22/2023 11:55    ASSESSMENT & PLAN:   77 y.o. male with:  #1  JAK2 positive polycythemia vera  severe thrombocytosis with platelets of 1005k in diagnosis 10/24/2019 JAK2 GenotypR is "POSITIVE for the detection of the V617F mutation"  #2  History of Dural venous sinus thrombosis -likely triggered by his polycythemia and thrombocytosis.  Evaluation   01/19/2020 BM Bx ([id number]) revealed "BONE MARROW, ASPIRATE, CLOT, CORE:  -  Normocellular marrow with megakaryocytic hyperplasia and atypia  -  Documented JAK-2 (V617F) mutation. PERIPHERAL BLOOD: -  Pancytopenia."  PLAN:   -Discussed labs today, 05/06/2023, in detail with patient. CBC shows increased WBC at 28.2 K, decreased hemoglobin at 7.5, decreased hematocrit at 23.0%, and decreased platelets at 90 K -CT scan of abdomen/pelvis from 04/18/2023 likely corresponds to nonenlarged 5 mm peripancreatic lymph node, no enlarged abdominal or pelvic lymph nodes, enlargement of the prostate with median lobe hypertrophy, and ovoid hypoattenuating foci within the pancreatic neck measuring 1.2 cm and body/tail measuring 1.3 cm without appreciable enhancement -[name] of abdomen from 04/02/2023 showed normal spleen size and possible peripancreatic adenopathy  -Mild anemia -Will increase Retacrit dose from 20,000 units to 40,000 units every 2 weeks  -Will prescribe trazodone to alleviate insomnia  -Will prescribe lorazepam to  take prn  -Patient will receive blood transfusion after this visit and will continue transfusions prn -Informed patient that Jak inhibitors are not necessary at this time as patient does not have symptoms such as enlarged spleen or skin flushing/redness -Discussed the option of receiving a blood transfusion after this visit -Discussed the possibility of lowering Eliquis dosage to increase blood counts -Continue B-12 supplement and B-complex supplement.   FOLLOW-UP: Please schedule Labs, Retacrit and appointment for 1 unit of PRBC every 2 weeks x  6 MD visit in 4 weeks  The total time spent in the appointment was 21 minutes* .  All of the patient's questions were answered with apparent satisfaction. The patient knows to call the clinic with any problems, questions or concerns.   Wyvonnia Lora MD MS AAHIVMS [facility] Hematology/Oncology Physician [facility]  .*Total Encounter Time as defined by the Centers for Medicare and Medicaid Services includes, in addition to the face-to-face time of a patient visit (documented in the note above) non-face-to-face time: obtaining and reviewing outside history, ordering and reviewing medications, tests or procedures, care coordination (communications with other health care professionals or caregivers) and documentation in the medical record.   Alben Deeds Teague,acting as a Neurosurgeon for Wyvonnia Lora, MD.,have documented all relevant documentation on the behalf of Wyvonnia Lora, MD,as directed by  Wyvonnia Lora, MD while in the presence of Wyvonnia Lora, MD.  .I have reviewed the above documentation for accuracy and completeness, and I agree with the above. Johney Maine MD

## 2023-05-06 NOTE — Progress Notes (Signed)
Patient seen by Dr. Addison Naegeli are within treatment parameters.  Labs reviewed: and are not all within treatment parameters. HGB 7.5 pt to get 1 unit of PRBCs  Per physician team, patient is ready for treatment and there are NO modifications to the treatment plan.

## 2023-05-07 ENCOUNTER — Telehealth: Payer: Self-pay | Admitting: Hematology

## 2023-05-07 ENCOUNTER — Other Ambulatory Visit: Payer: Self-pay

## 2023-05-07 ENCOUNTER — Inpatient Hospital Stay: Payer: Medicare PPO

## 2023-05-07 VITALS — BP 109/74 | HR 69 | Temp 97.7°F | Resp 18

## 2023-05-07 DIAGNOSIS — D473 Essential (hemorrhagic) thrombocythemia: Secondary | ICD-10-CM | POA: Diagnosis not present

## 2023-05-07 DIAGNOSIS — D75839 Thrombocytosis, unspecified: Secondary | ICD-10-CM | POA: Diagnosis not present

## 2023-05-07 DIAGNOSIS — Z86010 Personal history of colonic polyps: Secondary | ICD-10-CM | POA: Diagnosis not present

## 2023-05-07 DIAGNOSIS — D7581 Myelofibrosis: Secondary | ICD-10-CM

## 2023-05-07 DIAGNOSIS — G4700 Insomnia, unspecified: Secondary | ICD-10-CM | POA: Diagnosis not present

## 2023-05-07 DIAGNOSIS — D45 Polycythemia vera: Secondary | ICD-10-CM | POA: Diagnosis not present

## 2023-05-07 DIAGNOSIS — R5383 Other fatigue: Secondary | ICD-10-CM | POA: Diagnosis not present

## 2023-05-07 DIAGNOSIS — D619 Aplastic anemia, unspecified: Secondary | ICD-10-CM

## 2023-05-07 LAB — PREPARE RBC (CROSSMATCH)

## 2023-05-07 LAB — TYPE AND SCREEN
ABO/RH(D): O POS
DAT, IgG: POSITIVE
Unit division: 0

## 2023-05-07 LAB — BPAM RBC

## 2023-05-07 MED ORDER — EPOETIN ALFA-EPBX 40000 UNIT/ML IJ SOLN
40000.0000 [IU] | Freq: Once | INTRAMUSCULAR | Status: AC
  Administered 2023-05-07: 40000 [IU] via SUBCUTANEOUS
  Filled 2023-05-07: qty 1

## 2023-05-07 MED ORDER — METHYLPREDNISOLONE SODIUM SUCC 40 MG IJ SOLR
40.0000 mg | Freq: Once | INTRAMUSCULAR | Status: AC
  Administered 2023-05-07: 40 mg via INTRAVENOUS
  Filled 2023-05-07: qty 1

## 2023-05-07 MED ORDER — ACETAMINOPHEN 325 MG PO TABS
650.0000 mg | ORAL_TABLET | Freq: Once | ORAL | Status: AC
  Administered 2023-05-07: 650 mg via ORAL
  Filled 2023-05-07: qty 2

## 2023-05-07 MED ORDER — SODIUM CHLORIDE 0.9% IV SOLUTION
250.0000 mL | Freq: Once | INTRAVENOUS | Status: AC
  Administered 2023-05-07: 250 mL via INTRAVENOUS

## 2023-05-08 LAB — BPAM RBC
Blood Product Expiration Date: 202407182359
ISSUE DATE / TIME: 202406180905
Unit Type and Rh: 5100

## 2023-05-08 LAB — TYPE AND SCREEN

## 2023-05-12 ENCOUNTER — Encounter: Payer: Self-pay | Admitting: Hematology

## 2023-05-13 DIAGNOSIS — Z01818 Encounter for other preprocedural examination: Secondary | ICD-10-CM | POA: Diagnosis not present

## 2023-05-13 DIAGNOSIS — D45 Polycythemia vera: Secondary | ICD-10-CM | POA: Diagnosis not present

## 2023-05-13 DIAGNOSIS — D7581 Myelofibrosis: Secondary | ICD-10-CM | POA: Diagnosis not present

## 2023-05-13 DIAGNOSIS — R768 Other specified abnormal immunological findings in serum: Secondary | ICD-10-CM | POA: Diagnosis not present

## 2023-05-20 ENCOUNTER — Inpatient Hospital Stay: Payer: Medicare PPO

## 2023-05-20 ENCOUNTER — Other Ambulatory Visit: Payer: Self-pay

## 2023-05-20 ENCOUNTER — Inpatient Hospital Stay: Payer: Medicare PPO | Attending: Hematology

## 2023-05-20 DIAGNOSIS — D7581 Myelofibrosis: Secondary | ICD-10-CM

## 2023-05-20 DIAGNOSIS — D473 Essential (hemorrhagic) thrombocythemia: Secondary | ICD-10-CM | POA: Insufficient documentation

## 2023-05-20 DIAGNOSIS — D619 Aplastic anemia, unspecified: Secondary | ICD-10-CM

## 2023-05-20 DIAGNOSIS — D696 Thrombocytopenia, unspecified: Secondary | ICD-10-CM

## 2023-05-20 LAB — CMP (CANCER CENTER ONLY)
ALT: 11 U/L (ref 0–44)
AST: 28 U/L (ref 15–41)
Albumin: 3.6 g/dL (ref 3.5–5.0)
Alkaline Phosphatase: 223 U/L — ABNORMAL HIGH (ref 38–126)
Anion gap: 7 (ref 5–15)
BUN: 20 mg/dL (ref 8–23)
CO2: 28 mmol/L (ref 22–32)
Calcium: 9 mg/dL (ref 8.9–10.3)
Chloride: 106 mmol/L (ref 98–111)
Creatinine: 1.28 mg/dL — ABNORMAL HIGH (ref 0.61–1.24)
GFR, Estimated: 58 mL/min — ABNORMAL LOW (ref >60)
Glucose, Bld: 91 mg/dL (ref 70–99)
Potassium: 4.6 mmol/L (ref 3.5–5.1)
Sodium: 141 mmol/L (ref 135–145)
Total Bilirubin: 1 mg/dL (ref 0.3–1.2)
Total Protein: 7 g/dL (ref 6.5–8.1)

## 2023-05-20 LAB — CBC WITH DIFFERENTIAL (CANCER CENTER ONLY)
Abs Immature Granulocytes: 8.03 10*3/uL — ABNORMAL HIGH (ref 0.00–0.07)
Basophils Absolute: 0.2 10*3/uL — ABNORMAL HIGH (ref 0.0–0.1)
Basophils Relative: 1 %
Eosinophils Absolute: 2.4 10*3/uL — ABNORMAL HIGH (ref 0.0–0.5)
Eosinophils Relative: 8 %
HCT: 24.6 % — ABNORMAL LOW (ref 39.0–52.0)
Hemoglobin: 8 g/dL — ABNORMAL LOW (ref 13.0–17.0)
Immature Granulocytes: 26 %
Lymphocytes Relative: 7 %
Lymphs Abs: 2.2 10*3/uL (ref 0.7–4.0)
MCH: 32 pg (ref 26.0–34.0)
MCHC: 32.5 g/dL (ref 30.0–36.0)
MCV: 98.4 fL (ref 80.0–100.0)
Monocytes Absolute: 4 10*3/uL — ABNORMAL HIGH (ref 0.1–1.0)
Monocytes Relative: 13 %
Neutro Abs: 14.5 10*3/uL — ABNORMAL HIGH (ref 1.7–7.7)
Neutrophils Relative %: 45 %
Platelet Count: 67 10*3/uL — ABNORMAL LOW (ref 150–400)
RBC: 2.5 MIL/uL — ABNORMAL LOW (ref 4.22–5.81)
RDW: 19.6 % — ABNORMAL HIGH (ref 11.5–15.5)
WBC Count: 31.3 10*3/uL — ABNORMAL HIGH (ref 4.0–10.5)
nRBC: 14.2 % — ABNORMAL HIGH (ref 0.0–0.2)

## 2023-05-20 LAB — LACTATE DEHYDROGENASE: LDH: 875 U/L — ABNORMAL HIGH (ref 98–192)

## 2023-05-20 LAB — SAMPLE TO BLOOD BANK

## 2023-05-21 ENCOUNTER — Inpatient Hospital Stay: Payer: Medicare PPO

## 2023-05-21 VITALS — BP 108/68 | HR 68 | Temp 98.2°F | Resp 18

## 2023-05-21 DIAGNOSIS — D7581 Myelofibrosis: Secondary | ICD-10-CM | POA: Diagnosis not present

## 2023-05-21 DIAGNOSIS — D473 Essential (hemorrhagic) thrombocythemia: Secondary | ICD-10-CM | POA: Diagnosis not present

## 2023-05-21 DIAGNOSIS — D619 Aplastic anemia, unspecified: Secondary | ICD-10-CM

## 2023-05-21 MED ORDER — EPOETIN ALFA-EPBX 40000 UNIT/ML IJ SOLN
40000.0000 [IU] | Freq: Once | INTRAMUSCULAR | Status: AC
  Administered 2023-05-21: 40000 [IU] via SUBCUTANEOUS
  Filled 2023-05-21: qty 1

## 2023-05-21 NOTE — Patient Instructions (Signed)

## 2023-05-27 ENCOUNTER — Other Ambulatory Visit: Payer: Self-pay

## 2023-05-27 DIAGNOSIS — D649 Anemia, unspecified: Secondary | ICD-10-CM | POA: Diagnosis not present

## 2023-05-27 DIAGNOSIS — R768 Other specified abnormal immunological findings in serum: Secondary | ICD-10-CM | POA: Diagnosis not present

## 2023-05-27 DIAGNOSIS — D45 Polycythemia vera: Secondary | ICD-10-CM | POA: Diagnosis not present

## 2023-05-27 DIAGNOSIS — D7581 Myelofibrosis: Secondary | ICD-10-CM | POA: Diagnosis not present

## 2023-05-27 DIAGNOSIS — R634 Abnormal weight loss: Secondary | ICD-10-CM | POA: Diagnosis not present

## 2023-05-27 DIAGNOSIS — D471 Chronic myeloproliferative disease: Secondary | ICD-10-CM | POA: Diagnosis not present

## 2023-05-27 DIAGNOSIS — D619 Aplastic anemia, unspecified: Secondary | ICD-10-CM

## 2023-05-28 ENCOUNTER — Other Ambulatory Visit: Payer: Self-pay

## 2023-05-28 ENCOUNTER — Inpatient Hospital Stay: Payer: Medicare PPO

## 2023-05-28 DIAGNOSIS — D7581 Myelofibrosis: Secondary | ICD-10-CM | POA: Diagnosis not present

## 2023-05-28 DIAGNOSIS — D619 Aplastic anemia, unspecified: Secondary | ICD-10-CM

## 2023-05-28 DIAGNOSIS — D473 Essential (hemorrhagic) thrombocythemia: Secondary | ICD-10-CM | POA: Diagnosis not present

## 2023-05-28 LAB — CBC WITH DIFFERENTIAL (CANCER CENTER ONLY)
Abs Immature Granulocytes: 8.42 10*3/uL — ABNORMAL HIGH (ref 0.00–0.07)
Basophils Absolute: 0.6 10*3/uL — ABNORMAL HIGH (ref 0.0–0.1)
Basophils Relative: 2 %
Eosinophils Absolute: 2.8 10*3/uL — ABNORMAL HIGH (ref 0.0–0.5)
Eosinophils Relative: 8 %
HCT: 22.2 % — ABNORMAL LOW (ref 39.0–52.0)
Hemoglobin: 7 g/dL — ABNORMAL LOW (ref 13.0–17.0)
Immature Granulocytes: 25 %
Lymphocytes Relative: 6 %
Lymphs Abs: 2 10*3/uL (ref 0.7–4.0)
MCH: 31 pg (ref 26.0–34.0)
MCHC: 31.5 g/dL (ref 30.0–36.0)
MCV: 98.2 fL (ref 80.0–100.0)
Monocytes Absolute: 4.7 10*3/uL — ABNORMAL HIGH (ref 0.1–1.0)
Monocytes Relative: 14 %
Neutro Abs: 15.6 10*3/uL — ABNORMAL HIGH (ref 1.7–7.7)
Neutrophils Relative %: 45 %
Platelet Count: 83 10*3/uL — ABNORMAL LOW (ref 150–400)
RBC: 2.26 MIL/uL — ABNORMAL LOW (ref 4.22–5.81)
RDW: 19.5 % — ABNORMAL HIGH (ref 11.5–15.5)
WBC Count: 34.1 10*3/uL — ABNORMAL HIGH (ref 4.0–10.5)
nRBC: 20.3 % — ABNORMAL HIGH (ref 0.0–0.2)

## 2023-05-28 LAB — PREPARE RBC (CROSSMATCH)

## 2023-05-28 LAB — SAMPLE TO BLOOD BANK

## 2023-05-30 ENCOUNTER — Inpatient Hospital Stay: Payer: Medicare PPO

## 2023-05-30 ENCOUNTER — Other Ambulatory Visit: Payer: Self-pay

## 2023-05-30 DIAGNOSIS — D7581 Myelofibrosis: Secondary | ICD-10-CM | POA: Diagnosis not present

## 2023-05-30 DIAGNOSIS — D619 Aplastic anemia, unspecified: Secondary | ICD-10-CM

## 2023-05-30 DIAGNOSIS — D473 Essential (hemorrhagic) thrombocythemia: Secondary | ICD-10-CM | POA: Diagnosis not present

## 2023-05-30 MED ORDER — METHYLPREDNISOLONE SODIUM SUCC 40 MG IJ SOLR
40.0000 mg | Freq: Once | INTRAMUSCULAR | Status: AC
  Administered 2023-05-30: 40 mg via INTRAVENOUS
  Filled 2023-05-30: qty 1

## 2023-05-30 MED ORDER — SODIUM CHLORIDE 0.9% IV SOLUTION
250.0000 mL | Freq: Once | INTRAVENOUS | Status: AC
  Administered 2023-05-30: 250 mL via INTRAVENOUS

## 2023-05-30 MED ORDER — ACETAMINOPHEN 325 MG PO TABS
650.0000 mg | ORAL_TABLET | Freq: Once | ORAL | Status: AC
  Administered 2023-05-30: 650 mg via ORAL
  Filled 2023-05-30: qty 2

## 2023-05-30 NOTE — Patient Instructions (Signed)
Blood Transfusion, Adult A blood transfusion is a procedure in which you receive blood through an IV tube. You may need this procedure because of: A bleeding disorder. An illness. An injury. A surgery. The blood may come from someone else (a donor). You may also be able to donate blood for yourself before a surgery. The blood given in a transfusion may be made up of different types of cells. You may get: Red blood cells. These carry oxygen to the cells in the body. Platelets. These help your blood to clot. Plasma. This is the liquid part of your blood. It carries proteins and other substances through the body. White blood cells. These help you fight infections. If you have a clotting disorder, you may also get other types of blood products. Depending on the type of blood product, this procedure may take 1-4 hours to complete. Tell your doctor about: Any bleeding problems you have. Any reactions you have had during a blood transfusion in the past. Any allergies you have. All medicines you are taking, including vitamins, herbs, eye drops, creams, and over-the-counter medicines. Any surgeries you have had. Any medical conditions you have. Whether you are pregnant or may be pregnant. What are the risks? Talk with your health care provider about risks. The most common problems include: A mild allergic reaction. This includes red, swollen areas of skin (hives) and itching. Fever or chills. This may be the body's response to new blood cells received. This may happen during or up to 4 hours after the transfusion. More serious problems may include: A serious allergic reaction. This includes breathing trouble or swelling around the face and lips. Too much fluid in the lungs. This may cause breathing problems. Lung injury. This causes breathing trouble and low oxygen in the blood. This can happen within hours of the transfusion or days later. Too much iron. This can happen after getting many blood  transfusions over a period of time. An infection or virus passed through the blood. This is rare. Donated blood is carefully tested before it is given. Your body's defense system (immune system) trying to attack the new blood cells. This is rare. Symptoms may include fever, chills, nausea, low blood pressure, and low back or chest pain. Donated cells attacking healthy tissues. This is rare. What happens before the procedure? You will have a blood test to find out your blood type. The test also finds out what type of blood your body will accept and matches it to the donor type. If you are going to have a planned surgery, you may be able to donate your own blood. This may be done in case you need a transfusion. You will have your temperature, blood pressure, and pulse checked. You may receive medicine to help prevent an allergic reaction. This may be done if you have had a reaction to a transfusion before. This medicine may be given to you by mouth or through an IV tube. What happens during the procedure?  An IV tube will be put into one of your veins. The bag of blood will be attached to your IV tube. Then, the blood will enter through your vein. Your temperature, blood pressure, and pulse will be checked often. This is done to find early signs of a transfusion reaction. Tell your nurse right away if you have any of these symptoms: Shortness of breath or trouble breathing. Chest or back pain. Fever or chills. Red, swollen areas of skin or itching. If you have any signs   or symptoms of a reaction, your transfusion will be stopped. You may also be given medicine. When the transfusion is finished, your IV tube will be taken out. Pressure may be put on the IV site for a few minutes. A bandage (dressing) will be put on the IV site. The procedure may vary among doctors and hospitals. What happens after the procedure? You will be monitored until you leave the hospital or clinic. This includes  checking your temperature, blood pressure, pulse, breathing rate, and blood oxygen level. Your blood may be tested to see how you have responded to the transfusion. You may be warmed with fluids or blankets. This is done to keep the temperature of your body normal. If you have your procedure in an outpatient setting, you will be told whom to contact to report any reactions. Where to find more information Visit the [employer]: redcross.org Summary A blood transfusion is a procedure in which you receive blood through an IV tube. The blood you are given may be made up of different blood cells. You may receive red blood cells, platelets, plasma, or white blood cells. Your temperature, blood pressure, and pulse will be checked often. After the procedure, your blood may be tested to see how you have responded. This information is not intended to replace advice given to you by your health care provider. Make sure you discuss any questions you have with your health care provider. Document Revised: 02/02/2022 Document Reviewed: 02/02/2022 Elsevier Patient Education  2024 Elsevier Inc.  

## 2023-05-31 ENCOUNTER — Other Ambulatory Visit: Payer: Self-pay

## 2023-05-31 DIAGNOSIS — D619 Aplastic anemia, unspecified: Secondary | ICD-10-CM

## 2023-05-31 DIAGNOSIS — D7581 Myelofibrosis: Secondary | ICD-10-CM

## 2023-05-31 LAB — TYPE AND SCREEN
ABO/RH(D): O POS
Antibody Screen: POSITIVE
DAT, IgG: POSITIVE
Unit division: 0

## 2023-05-31 LAB — BPAM RBC
Blood Product Expiration Date: 202408052359
ISSUE DATE / TIME: 202407111003
Unit Type and Rh: 5100

## 2023-06-03 ENCOUNTER — Inpatient Hospital Stay: Payer: Medicare PPO

## 2023-06-03 ENCOUNTER — Other Ambulatory Visit: Payer: Self-pay

## 2023-06-03 DIAGNOSIS — D7581 Myelofibrosis: Secondary | ICD-10-CM

## 2023-06-03 DIAGNOSIS — D619 Aplastic anemia, unspecified: Secondary | ICD-10-CM

## 2023-06-03 DIAGNOSIS — D473 Essential (hemorrhagic) thrombocythemia: Secondary | ICD-10-CM | POA: Diagnosis not present

## 2023-06-03 LAB — CBC WITH DIFFERENTIAL (CANCER CENTER ONLY)
Abs Immature Granulocytes: 8.5 10*3/uL — ABNORMAL HIGH (ref 0.00–0.07)
Band Neutrophils: 15 %
Basophils Absolute: 0.6 10*3/uL — ABNORMAL HIGH (ref 0.0–0.1)
Basophils Relative: 2 %
Blasts: 6 %
Eosinophils Absolute: 3.2 10*3/uL — ABNORMAL HIGH (ref 0.0–0.5)
Eosinophils Relative: 10 %
HCT: 25.9 % — ABNORMAL LOW (ref 39.0–52.0)
Hemoglobin: 8.3 g/dL — ABNORMAL LOW (ref 13.0–17.0)
Lymphocytes Relative: 10 %
Lymphs Abs: 3.2 10*3/uL (ref 0.7–4.0)
MCH: 31 pg (ref 26.0–34.0)
MCHC: 32 g/dL (ref 30.0–36.0)
MCV: 96.6 fL (ref 80.0–100.0)
Metamyelocytes Relative: 13 %
Monocytes Absolute: 0.6 10*3/uL (ref 0.1–1.0)
Monocytes Relative: 2 %
Myelocytes: 14 %
Neutro Abs: 13.6 10*3/uL — ABNORMAL HIGH (ref 1.7–7.7)
Neutrophils Relative %: 28 %
Platelet Count: 75 10*3/uL — ABNORMAL LOW (ref 150–400)
RBC: 2.68 MIL/uL — ABNORMAL LOW (ref 4.22–5.81)
RDW: 19.7 % — ABNORMAL HIGH (ref 11.5–15.5)
Smear Review: DECREASED
WBC Count: 31.6 10*3/uL — ABNORMAL HIGH (ref 4.0–10.5)
nRBC: 19.5 % — ABNORMAL HIGH (ref 0.0–0.2)

## 2023-06-03 LAB — CMP (CANCER CENTER ONLY)
ALT: 17 U/L (ref 0–44)
AST: 33 U/L (ref 15–41)
Albumin: 3.8 g/dL (ref 3.5–5.0)
Alkaline Phosphatase: 211 U/L — ABNORMAL HIGH (ref 38–126)
Anion gap: 6 (ref 5–15)
BUN: 20 mg/dL (ref 8–23)
CO2: 29 mmol/L (ref 22–32)
Calcium: 9.7 mg/dL (ref 8.9–10.3)
Chloride: 106 mmol/L (ref 98–111)
Creatinine: 1.28 mg/dL — ABNORMAL HIGH (ref 0.61–1.24)
GFR, Estimated: 58 mL/min — ABNORMAL LOW (ref >60)
Glucose, Bld: 84 mg/dL (ref 70–99)
Potassium: 4.1 mmol/L (ref 3.5–5.1)
Sodium: 141 mmol/L (ref 135–145)
Total Bilirubin: 0.7 mg/dL (ref 0.3–1.2)
Total Protein: 7.4 g/dL (ref 6.5–8.1)

## 2023-06-03 LAB — SAMPLE TO BLOOD BANK

## 2023-06-03 LAB — RETICULOCYTES
Immature Retic Fract: 45.8 % — ABNORMAL HIGH (ref 2.3–15.9)
RBC.: 2.64 MIL/uL — ABNORMAL LOW (ref 4.22–5.81)
Retic Count, Absolute: 56.5 10*3/uL (ref 19.0–186.0)
Retic Ct Pct: 2.1 % (ref 0.4–3.1)

## 2023-06-03 LAB — LACTATE DEHYDROGENASE: LDH: 951 U/L — ABNORMAL HIGH (ref 98–192)

## 2023-06-04 ENCOUNTER — Inpatient Hospital Stay: Payer: Medicare PPO

## 2023-06-05 ENCOUNTER — Inpatient Hospital Stay: Payer: Medicare PPO | Admitting: Hematology

## 2023-06-05 ENCOUNTER — Other Ambulatory Visit: Payer: Self-pay

## 2023-06-05 ENCOUNTER — Inpatient Hospital Stay: Payer: Medicare PPO

## 2023-06-05 VITALS — BP 110/67 | HR 83 | Temp 99.8°F | Resp 18 | Wt 144.4 lb

## 2023-06-05 VITALS — BP 109/73 | HR 83 | Temp 97.6°F | Resp 16

## 2023-06-05 DIAGNOSIS — D473 Essential (hemorrhagic) thrombocythemia: Secondary | ICD-10-CM | POA: Diagnosis not present

## 2023-06-05 DIAGNOSIS — D7581 Myelofibrosis: Secondary | ICD-10-CM | POA: Diagnosis not present

## 2023-06-05 DIAGNOSIS — D45 Polycythemia vera: Secondary | ICD-10-CM

## 2023-06-05 DIAGNOSIS — D619 Aplastic anemia, unspecified: Secondary | ICD-10-CM

## 2023-06-05 LAB — RETICULOCYTES
Immature Retic Fract: 45.9 % — ABNORMAL HIGH (ref 2.3–15.9)
RBC.: 2.61 MIL/uL — ABNORMAL LOW (ref 4.22–5.81)
Retic Count, Absolute: 82.5 10*3/uL (ref 19.0–186.0)
Retic Ct Pct: 3.2 % — ABNORMAL HIGH (ref 0.4–3.1)

## 2023-06-05 LAB — CMP (CANCER CENTER ONLY)
ALT: 16 U/L (ref 0–44)
AST: 34 U/L (ref 15–41)
Albumin: 4 g/dL (ref 3.5–5.0)
Alkaline Phosphatase: 288 U/L — ABNORMAL HIGH (ref 38–126)
Anion gap: 8 (ref 5–15)
BUN: 25 mg/dL — ABNORMAL HIGH (ref 8–23)
CO2: 25 mmol/L (ref 22–32)
Calcium: 10 mg/dL (ref 8.9–10.3)
Chloride: 105 mmol/L (ref 98–111)
Creatinine: 1.14 mg/dL (ref 0.61–1.24)
GFR, Estimated: >60 mL/min (ref >60)
Glucose, Bld: 85 mg/dL (ref 70–99)
Potassium: 4.2 mmol/L (ref 3.5–5.1)
Sodium: 138 mmol/L (ref 135–145)
Total Bilirubin: 0.7 mg/dL (ref 0.3–1.2)
Total Protein: 8 g/dL (ref 6.5–8.1)

## 2023-06-05 LAB — CBC WITH DIFFERENTIAL (CANCER CENTER ONLY)
Abs Immature Granulocytes: 9.75 10*3/uL — ABNORMAL HIGH (ref 0.00–0.07)
Basophils Absolute: 0.4 10*3/uL — ABNORMAL HIGH (ref 0.0–0.1)
Basophils Relative: 1 %
Eosinophils Absolute: 3.8 10*3/uL — ABNORMAL HIGH (ref 0.0–0.5)
Eosinophils Relative: 10 %
HCT: 26.1 % — ABNORMAL LOW (ref 39.0–52.0)
Hemoglobin: 8.3 g/dL — ABNORMAL LOW (ref 13.0–17.0)
Immature Granulocytes: 26 %
Lymphocytes Relative: 6 %
Lymphs Abs: 2.3 10*3/uL (ref 0.7–4.0)
MCH: 31.2 pg (ref 26.0–34.0)
MCHC: 31.8 g/dL (ref 30.0–36.0)
MCV: 98.1 fL (ref 80.0–100.0)
Monocytes Absolute: 3.6 10*3/uL — ABNORMAL HIGH (ref 0.1–1.0)
Monocytes Relative: 9 %
Neutro Abs: 18.5 10*3/uL — ABNORMAL HIGH (ref 1.7–7.7)
Neutrophils Relative %: 48 %
Platelet Count: 70 10*3/uL — ABNORMAL LOW (ref 150–400)
RBC: 2.66 MIL/uL — ABNORMAL LOW (ref 4.22–5.81)
RDW: 20.5 % — ABNORMAL HIGH (ref 11.5–15.5)
Smear Review: NORMAL
WBC Count: 38.3 10*3/uL — ABNORMAL HIGH (ref 4.0–10.5)
nRBC: 16.8 % — ABNORMAL HIGH (ref 0.0–0.2)

## 2023-06-05 LAB — SAMPLE TO BLOOD BANK

## 2023-06-05 LAB — DIRECT ANTIGLOBULIN TEST (NOT AT ARMC)
DAT, IgG: POSITIVE
DAT, complement: NEGATIVE

## 2023-06-05 LAB — LACTATE DEHYDROGENASE: LDH: 1019 U/L — ABNORMAL HIGH (ref 98–192)

## 2023-06-05 MED ORDER — EPOETIN ALFA-EPBX 40000 UNIT/ML IJ SOLN
40000.0000 [IU] | Freq: Once | INTRAMUSCULAR | Status: AC
  Administered 2023-06-05: 40000 [IU] via SUBCUTANEOUS
  Filled 2023-06-05: qty 1

## 2023-06-05 NOTE — Patient Instructions (Signed)

## 2023-06-05 NOTE — Progress Notes (Signed)
HEMATOLOGY/ONCOLOGY CLINIC VISIT NOTE  Date of Service: 06/05/2023   Patient Care Team: Noberto Retort, MD as PCP - General (Family Medicine)  CHIEF COMPLAINTS/PURPOSE OF CONSULTATION:  F/u for continued mx of Polycythemia vera and newly diagnosed post PV secondary myelofibrosis.  HISTORY OF PRESENTING ILLNESS:   Please see previous note for details on initial presentation  INTERVAL HISTORY:   Dillon Hill is a 77 y.o. male here for continued evaluation and management of Polycythemia vera.   Patient was last seen by me on 05/06/2023 and complained of insomnia, fatigue, occasional rashes along the bilateral UE,   He was seen by Dr. Dimas Aguas on 05/27/2023 and additional testing and discussed transplant related considerations.   Today, he reports that the options including clinical trials were discussed with Dr. Dimas Aguas involving new medications.   He denies any bleeding issues, infection issues, leg swelling, or abdominal pain. Patient reports mild bone pain sometimes. He reports previous low energy levels. Lorazepam use did improve sleeping habits.   He notes that his weight is starting to stabilize, and his current weight is 144 pounds.   He expressed some concern over potentially receiving incorrect medication by the pharmacy.  MEDICAL HISTORY:  Past Medical History:  Diagnosis Date   BPH (benign prostatic hyperplasia)    Colon polyp    DDD (degenerative disc disease), cervical    High cholesterol    Migraine    Polycythemia vera (HCC)     SURGICAL HISTORY: Past Surgical History:  Procedure Laterality Date   EYE SURGERY      SOCIAL HISTORY: Social History   Socioeconomic History   Marital status: Widowed    Spouse name: Not on file   Number of children: Not on file   Years of education: Not on file   Highest education level: Not on file  Occupational History   Not on file  Tobacco Use   Smoking status: Never   Smokeless tobacco: Never   Tobacco  comments:    smoked x 1 year at age 7  Vaping Use   Vaping status: Never Used  Substance and Sexual Activity   Alcohol use: Yes    Comment: occ   Drug use: Never   Sexual activity: Not on file  Other Topics Concern   Not on file  Social History Narrative   Not on file   Social Determinants of Health   Financial Resource Strain: Not on file  Food Insecurity: Not on file  Transportation Needs: Not on file  Physical Activity: Not on file  Stress: Not on file  Social Connections: Not on file  Intimate Partner Violence: Not on file    FAMILY HISTORY: No family history on file.  ALLERGIES:  has No Known Allergies.  MEDICATIONS:  Current Outpatient Medications  Medication Sig Dispense Refill   apixaban (ELIQUIS) 5 MG TABS tablet TAKE 1 TABLET(5 MG) BY MOUTH TWICE DAILY 60 tablet 2   Cyanocobalamin (B-12) 1000 MCG SUBL Place 1,000 mcg under the tongue daily. 30 tablet 5   folic acid (FOLVITE) 1 MG tablet TAKE 1 TABLET(1 MG) BY MOUTH DAILY 90 tablet 0   LORazepam (ATIVAN) 0.5 MG tablet Take 1 tablet (0.5 mg total) by mouth at bedtime as needed for anxiety or sleep. 30 tablet 0   Multiple Vitamin (MULTIVITAMIN WITH MINERALS) TABS tablet Take 1 tablet by mouth daily.     traZODone (DESYREL) 100 MG tablet Take 1 tablet (100 mg total) by mouth at bedtime. 30 tablet  1   No current facility-administered medications for this visit.    REVIEW OF SYSTEMS:    10 Point review of Systems was done is negative except as noted above.   PHYSICAL EXAMINATION: .BP 110/67   Pulse 83   Temp 99.8 F (37.7 C)   Resp 18   Wt 144 lb 6.4 oz (65.5 kg)   SpO2 99%   BMI 20.72 kg/m  GENERAL:alert, in no acute distress and comfortable SKIN: no acute rashes, no significant lesions EYES: conjunctiva are pink and non-injected, sclera anicteric OROPHARYNX: MMM, no exudates, no oropharyngeal erythema or ulceration NECK: supple, no JVD LYMPH:  no palpable lymphadenopathy in the cervical, axillary  or inguinal regions LUNGS: clear to auscultation b/l with normal respiratory effort HEART: regular rate & rhythm ABDOMEN:  normoactive bowel sounds , non tender, not distended. Extremity: no pedal edema PSYCH: alert & oriented x 3 with fluent speech NEURO: no focal motor/sensory deficits   LABORATORY DATA:  I have reviewed the data as listed  .    Latest Ref Rng & Units 06/03/2023    7:37 AM 05/28/2023    8:36 AM 05/20/2023    7:46 AM  CBC  WBC 4.0 - 10.5 K/uL 31.6  34.1  31.3   Hemoglobin 13.0 - 17.0 g/dL 8.3  7.0  8.0   Hematocrit 39.0 - 52.0 % 25.9  22.2  24.6   Platelets 150 - 400 K/uL 75  83  67     .    Latest Ref Rng & Units 06/03/2023    7:37 AM 05/20/2023    7:46 AM 05/06/2023   12:59 PM  CMP  Glucose 70 - 99 mg/dL 84  91  664   BUN 8 - 23 mg/dL 20  20  18    Creatinine 0.61 - 1.24 mg/dL 4.03  4.74  2.59   Sodium 135 - 145 mmol/L 141  141  138   Potassium 3.5 - 5.1 mmol/L 4.1  4.6  4.2   Chloride 98 - 111 mmol/L 106  106  105   CO2 22 - 32 mmol/L 29  28  26    Calcium 8.9 - 10.3 mg/dL 9.7  9.0  9.6   Total Protein 6.5 - 8.1 g/dL 7.4  7.0  7.0   Total Bilirubin 0.3 - 1.2 mg/dL 0.7  1.0  0.8   Alkaline Phos 38 - 126 U/L 211  223  210   AST 15 - 41 U/L 33  28  29   ALT 0 - 44 U/L 17  11  10              01/19/2020 [id number] Flow Pathology (BM Bx):   01/19/2020 [id number] BM Bx Report:    10/24/2019 JAK2 mutation testing:    RADIOGRAPHIC STUDIES: I have personally reviewed the radiological images as listed and agreed with the findings in the report. No results found.  ASSESSMENT & PLAN:   77 y.o. male with:  #1  JAK2 positive polycythemia vera  severe thrombocytosis with platelets of 1005k in diagnosis 10/24/2019 JAK2 GenotypR is "POSITIVE for the detection of the V617F mutation"  #2  History of Dural venous sinus thrombosis -likely triggered by his polycythemia and thrombocytosis.  Evaluation   01/19/2020 BM Bx ([id number]) revealed "BONE  MARROW, ASPIRATE, CLOT, CORE:  -  Normocellular marrow with megakaryocytic hyperplasia and atypia  -  Documented JAK-2 (V617F) mutation. PERIPHERAL BLOOD: -  Pancytopenia."  PLAN:   -patient is scheduled ot receive Bone marow  biopsy tomorrow, 06/06/2023 -Discussed lab results from 06/03/2023 in detail with patient. CBC stable, showed WBC of 31.6K, hemoglobin of 8.3, and platelets of 75K. -CMP stable -LDH elevated mainly from myelofibrosis as proliferation of scarring in the bone marrow increases LDH level -testing showed abnormal antibodies which may have been triggered by blood transfusion or may have presented on its own. Abnormal antibodies may cause RBC to break up, and steroids would need to be prescribed in such case. Steroids may also improve anemia.  -will continue Retacrit injection at the current dose of 40,000 units every 2 weeks  -Discussed the concern that although there was no leukemic transformation at the time of his previous imaging, there were findings of more immature WBC in the peripheral blood.  -Discussed two main concerns Scar tissue can cause bone marrow failure requiring increased transfusion needs In addition to JAK2 mutaion, other mulecular mutations can change patient's condition to acute leukemia -patient has required 1 blood transfusion in the last month. Retacrit has helped some in reducing patient's transfusion needs -1 unit of PRBC every 2 weeks x 6 -standard treatment is somewhat limited and may not be as effective. Reasonable for patient to look into clinical trials for further considerations -will connect with Dr. Dimas Aguas to discuss whether patient shall transition care for routine lab monitoring and transfusion support, or it patient shall continue to follow with [name] -continue Trazodone for insomnia  -continue Lorazepam to take prn  -Continue B-12 supplement and B-complex supplement.  -advised patient to confirm with pharmacist to ensure he received correct  medication -will order blood work for further evaluation -answered all of patient's questions in detail  FOLLOW-UP: Labs today Please schedule Labs, Retacrit and appointment for 1 unit of PRBC every 2 weeks x 6 MD visit in 4 weeks  The total time spent in the appointment was 30 minutes* .  All of the patient's questions were answered with apparent satisfaction. The patient knows to call the clinic with any problems, questions or concerns.   Wyvonnia Lora MD MS AAHIVMS [facility] Hematology/Oncology Physician [place]  .*Total Encounter Time as defined by the Centers for Medicare and Medicaid Services includes, in addition to the face-to-face time of a patient visit (documented in the note above) non-face-to-face time: obtaining and reviewing outside history, ordering and reviewing medications, tests or procedures, care coordination (communications with other health care professionals or caregivers) and documentation in the medical record.    I,Mitra Faeizi,acting as a Neurosurgeon for Wyvonnia Lora, MD.,have documented all relevant documentation on the behalf of Wyvonnia Lora, MD,as directed by  Wyvonnia Lora, MD while in the presence of Wyvonnia Lora, MD.  .I have reviewed the above documentation for accuracy and completeness, and I agree with the above. Johney Maine MD   ADDENDUM  .    Latest Ref Rng & Units 06/05/2023    1:55 PM 06/03/2023    7:37 AM 05/28/2023    8:36 AM  CBC  WBC 4.0 - 10.5 K/uL 38.3  31.6  34.1   Hemoglobin 13.0 - 17.0 g/dL 8.3  8.3  7.0   Hematocrit 39.0 - 52.0 % 26.1  25.9  22.2   Platelets 150 - 400 K/uL 70  75  83    .    Latest Ref Rng & Units 06/05/2023    1:55 PM 06/03/2023    7:37 AM 05/20/2023    7:46 AM  CMP  Glucose 70 - 99 mg/dL 85  84  91  BUN 8 - 23 mg/dL 25  20  20    Creatinine 0.61 - 1.24 mg/dL 2.44  0.10  2.72   Sodium 135 - 145 mmol/L 138  141  141   Potassium 3.5 - 5.1 mmol/L 4.2  4.1  4.6   Chloride 98 - 111 mmol/L 105   106  106   CO2 22 - 32 mmol/L 25  29  28    Calcium 8.9 - 10.3 mg/dL 53.6  9.7  9.0   Total Protein 6.5 - 8.1 g/dL 8.0  7.4  7.0   Total Bilirubin 0.3 - 1.2 mg/dL 0.7  0.7  1.0   Alkaline Phos 38 - 126 U/L 288  211  223   AST 15 - 41 U/L 34  33  28   ALT 0 - 44 U/L 16  17  11     . Lab Results  Component Value Date   RETICCTPCT 3.2 (H) 06/05/2023   RBC 2.66 (L) 06/05/2023   . Lab Results  Component Value Date   LDH 1,019 (H) 06/05/2023   Haptoglobin<10  COombs +ve for IgG  Hgb relatively stable and LDH elevation primarily from myelofibrosis. Haptoglobin <10 which could be from intramedullary hemolysis. No reticulocytosis. And bilirubin is WNL. 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  Johney Maine MD

## 2023-06-06 DIAGNOSIS — D7581 Myelofibrosis: Secondary | ICD-10-CM | POA: Diagnosis not present

## 2023-06-06 DIAGNOSIS — D45 Polycythemia vera: Secondary | ICD-10-CM | POA: Diagnosis not present

## 2023-06-07 DIAGNOSIS — D45 Polycythemia vera: Secondary | ICD-10-CM | POA: Diagnosis not present

## 2023-06-07 DIAGNOSIS — D7581 Myelofibrosis: Secondary | ICD-10-CM | POA: Diagnosis not present

## 2023-06-07 LAB — HAPTOGLOBIN: Haptoglobin: <10 mg/dL — ABNORMAL LOW (ref 34–355)

## 2023-06-10 DIAGNOSIS — D7581 Myelofibrosis: Secondary | ICD-10-CM | POA: Diagnosis not present

## 2023-06-10 DIAGNOSIS — D45 Polycythemia vera: Secondary | ICD-10-CM | POA: Diagnosis not present

## 2023-06-12 ENCOUNTER — Encounter: Payer: Self-pay | Admitting: Hematology

## 2023-06-12 MED ORDER — PREDNISONE 20 MG PO TABS: ORAL_TABLET | ORAL | 0 refills | Status: AC

## 2023-06-13 DIAGNOSIS — D471 Chronic myeloproliferative disease: Secondary | ICD-10-CM | POA: Diagnosis not present

## 2023-06-14 ENCOUNTER — Other Ambulatory Visit: Payer: Self-pay

## 2023-06-14 ENCOUNTER — Telehealth: Payer: Self-pay | Admitting: *Deleted

## 2023-06-14 DIAGNOSIS — D619 Aplastic anemia, unspecified: Secondary | ICD-10-CM

## 2023-06-14 DIAGNOSIS — D7581 Myelofibrosis: Secondary | ICD-10-CM

## 2023-06-14 NOTE — Telephone Encounter (Addendum)
Contacted patient with information per Dr Candise Che and Georga Kaufmann as noted in messages below. Patient verbalized understanding of explanation and stated he had picked up the prednisone and would start in the morning.  He states he will see Dr. Dimas Aguas at [facility] Monday afternoon as they are 'trying to figure out a plan for me'.    ----- Message from Briant Cedar sent at 06/14/2023  3:29 PM EDT ----- Regarding: RE: Results? 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  Goal of the steroids is to suppress the immune response and hopefully halt the hemolysis which will improve his anemia.  ----- Message ----- From: Tonny Bollman, RN Sent: 06/13/2023   5:33 PM EDT To: Tonny Bollman, RN; Briant Cedar, PA-C Subject: Results?                                      Karena Addison, Please advise. Rx was sent to pharmacy yesterday am. 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

## 2023-06-17 ENCOUNTER — Other Ambulatory Visit: Payer: Self-pay

## 2023-06-17 ENCOUNTER — Inpatient Hospital Stay: Payer: Medicare PPO

## 2023-06-17 DIAGNOSIS — D45 Polycythemia vera: Secondary | ICD-10-CM | POA: Diagnosis not present

## 2023-06-17 DIAGNOSIS — D619 Aplastic anemia, unspecified: Secondary | ICD-10-CM

## 2023-06-17 DIAGNOSIS — D7581 Myelofibrosis: Secondary | ICD-10-CM

## 2023-06-17 DIAGNOSIS — L989 Disorder of the skin and subcutaneous tissue, unspecified: Secondary | ICD-10-CM | POA: Diagnosis not present

## 2023-06-17 DIAGNOSIS — D473 Essential (hemorrhagic) thrombocythemia: Secondary | ICD-10-CM | POA: Diagnosis not present

## 2023-06-17 DIAGNOSIS — D471 Chronic myeloproliferative disease: Secondary | ICD-10-CM | POA: Diagnosis not present

## 2023-06-17 LAB — SAMPLE TO BLOOD BANK

## 2023-06-17 LAB — CMP (CANCER CENTER ONLY)
ALT: 15 U/L (ref 0–44)
AST: 40 U/L (ref 15–41)
Albumin: 3.7 g/dL (ref 3.5–5.0)
Alkaline Phosphatase: 223 U/L — ABNORMAL HIGH (ref 38–126)
Anion gap: 8 (ref 5–15)
BUN: 32 mg/dL — ABNORMAL HIGH (ref 8–23)
CO2: 25 mmol/L (ref 22–32)
Calcium: 9.6 mg/dL (ref 8.9–10.3)
Chloride: 107 mmol/L (ref 98–111)
Creatinine: 1.18 mg/dL (ref 0.61–1.24)
GFR, Estimated: >60 mL/min (ref >60)
Glucose, Bld: 113 mg/dL — ABNORMAL HIGH (ref 70–99)
Potassium: 3.7 mmol/L (ref 3.5–5.1)
Sodium: 140 mmol/L (ref 135–145)
Total Bilirubin: 0.7 mg/dL (ref 0.3–1.2)
Total Protein: 6.8 g/dL (ref 6.5–8.1)

## 2023-06-17 LAB — CBC WITH DIFFERENTIAL (CANCER CENTER ONLY)
Abs Immature Granulocytes: 8.8 10*3/uL — ABNORMAL HIGH (ref 0.00–0.07)
Band Neutrophils: 7 %
Basophils Absolute: 0 10*3/uL (ref 0.0–0.1)
Basophils Relative: 0 %
Blasts: 4 %
Eosinophils Absolute: 2.6 10*3/uL — ABNORMAL HIGH (ref 0.0–0.5)
Eosinophils Relative: 6 %
HCT: 21.9 % — ABNORMAL LOW (ref 39.0–52.0)
Hemoglobin: 7 g/dL — ABNORMAL LOW (ref 13.0–17.0)
Lymphocytes Relative: 6 %
Lymphs Abs: 2.6 10*3/uL (ref 0.7–4.0)
MCH: 31.4 pg (ref 26.0–34.0)
MCHC: 32 g/dL (ref 30.0–36.0)
MCV: 98.2 fL (ref 80.0–100.0)
Metamyelocytes Relative: 13 %
Monocytes Absolute: 6.6 10*3/uL — ABNORMAL HIGH (ref 0.1–1.0)
Monocytes Relative: 15 %
Myelocytes: 7 %
Neutro Abs: 21.5 10*3/uL — ABNORMAL HIGH (ref 1.7–7.7)
Neutrophils Relative %: 42 %
Platelet Count: 63 10*3/uL — ABNORMAL LOW (ref 150–400)
RBC: 2.23 MIL/uL — ABNORMAL LOW (ref 4.22–5.81)
RDW: 21 % — ABNORMAL HIGH (ref 11.5–15.5)
Smear Review: NORMAL
WBC Count: 43.8 10*3/uL — ABNORMAL HIGH (ref 4.0–10.5)
nRBC: 20 /100 WBC — ABNORMAL HIGH
nRBC: 20.4 % — ABNORMAL HIGH (ref 0.0–0.2)

## 2023-06-17 LAB — TYPE AND SCREEN
ABO/RH(D): O POS
Antibody Screen: POSITIVE
DAT, IgG: POSITIVE

## 2023-06-17 LAB — RETICULOCYTES
Immature Retic Fract: 36.1 % — ABNORMAL HIGH (ref 2.3–15.9)
RBC.: 2.23 MIL/uL — ABNORMAL LOW (ref 4.22–5.81)
Retic Count, Absolute: 64 10*3/uL (ref 19.0–186.0)
Retic Ct Pct: 2.9 % (ref 0.4–3.1)

## 2023-06-17 LAB — PREPARE RBC (CROSSMATCH)

## 2023-06-17 LAB — LACTATE DEHYDROGENASE: LDH: 1180 U/L — ABNORMAL HIGH (ref 98–192)

## 2023-06-18 ENCOUNTER — Inpatient Hospital Stay (HOSPITAL_BASED_OUTPATIENT_CLINIC_OR_DEPARTMENT_OTHER): Payer: Medicare PPO

## 2023-06-18 ENCOUNTER — Other Ambulatory Visit: Payer: Self-pay

## 2023-06-18 VITALS — BP 118/67 | HR 65 | Temp 97.6°F | Resp 18

## 2023-06-18 DIAGNOSIS — D45 Polycythemia vera: Secondary | ICD-10-CM

## 2023-06-18 DIAGNOSIS — D473 Essential (hemorrhagic) thrombocythemia: Secondary | ICD-10-CM | POA: Diagnosis not present

## 2023-06-18 DIAGNOSIS — D7581 Myelofibrosis: Secondary | ICD-10-CM | POA: Diagnosis not present

## 2023-06-18 DIAGNOSIS — D619 Aplastic anemia, unspecified: Secondary | ICD-10-CM

## 2023-06-18 MED ORDER — ACETAMINOPHEN 325 MG PO TABS
650.0000 mg | ORAL_TABLET | Freq: Once | ORAL | Status: AC
  Administered 2023-06-18: 650 mg via ORAL
  Filled 2023-06-18: qty 2

## 2023-06-18 MED ORDER — METHYLPREDNISOLONE SODIUM SUCC 40 MG IJ SOLR
40.0000 mg | Freq: Once | INTRAMUSCULAR | Status: AC
  Administered 2023-06-18: 40 mg via INTRAVENOUS
  Filled 2023-06-18: qty 1

## 2023-06-18 MED ORDER — EPOETIN ALFA-EPBX 40000 UNIT/ML IJ SOLN
40000.0000 [IU] | Freq: Once | INTRAMUSCULAR | Status: AC
  Administered 2023-06-18: 40000 [IU] via SUBCUTANEOUS
  Filled 2023-06-18: qty 1

## 2023-06-18 MED ORDER — SODIUM CHLORIDE 0.9% IV SOLUTION
250.0000 mL | Freq: Once | INTRAVENOUS | Status: AC
  Administered 2023-06-18: 250 mL via INTRAVENOUS

## 2023-06-28 ENCOUNTER — Other Ambulatory Visit: Payer: Self-pay

## 2023-06-28 DIAGNOSIS — D45 Polycythemia vera: Secondary | ICD-10-CM

## 2023-06-28 DIAGNOSIS — D619 Aplastic anemia, unspecified: Secondary | ICD-10-CM

## 2023-07-01 ENCOUNTER — Inpatient Hospital Stay: Payer: Medicare PPO

## 2023-07-01 DIAGNOSIS — D7581 Myelofibrosis: Secondary | ICD-10-CM | POA: Diagnosis not present

## 2023-07-01 DIAGNOSIS — D5911 Warm autoimmune hemolytic anemia: Secondary | ICD-10-CM | POA: Diagnosis not present

## 2023-07-01 DIAGNOSIS — G4709 Other insomnia: Secondary | ICD-10-CM | POA: Diagnosis not present

## 2023-07-01 DIAGNOSIS — D471 Chronic myeloproliferative disease: Secondary | ICD-10-CM | POA: Diagnosis not present

## 2023-07-01 DIAGNOSIS — D45 Polycythemia vera: Secondary | ICD-10-CM | POA: Diagnosis not present

## 2023-07-02 ENCOUNTER — Other Ambulatory Visit: Payer: Self-pay | Admitting: Hematology

## 2023-07-02 ENCOUNTER — Inpatient Hospital Stay: Payer: Medicare PPO

## 2023-07-08 DIAGNOSIS — D471 Chronic myeloproliferative disease: Secondary | ICD-10-CM | POA: Diagnosis not present

## 2023-07-15 ENCOUNTER — Inpatient Hospital Stay: Payer: Medicare PPO

## 2023-07-16 ENCOUNTER — Inpatient Hospital Stay: Payer: Medicare PPO | Admitting: Hematology

## 2023-07-16 ENCOUNTER — Inpatient Hospital Stay: Payer: Medicare PPO

## 2023-07-16 DIAGNOSIS — D5911 Warm autoimmune hemolytic anemia: Secondary | ICD-10-CM | POA: Diagnosis not present

## 2023-07-16 DIAGNOSIS — D45 Polycythemia vera: Secondary | ICD-10-CM | POA: Diagnosis not present

## 2023-07-16 DIAGNOSIS — D471 Chronic myeloproliferative disease: Secondary | ICD-10-CM | POA: Diagnosis not present

## 2023-07-16 DIAGNOSIS — D7581 Myelofibrosis: Secondary | ICD-10-CM | POA: Diagnosis not present

## 2023-07-18 ENCOUNTER — Inpatient Hospital Stay: Payer: Medicare PPO | Attending: Hematology | Admitting: Hematology

## 2023-07-18 VITALS — BP 114/67 | HR 83 | Temp 98.3°F | Resp 16 | Wt 149.9 lb

## 2023-07-18 DIAGNOSIS — D75839 Thrombocytosis, unspecified: Secondary | ICD-10-CM | POA: Insufficient documentation

## 2023-07-18 DIAGNOSIS — D45 Polycythemia vera: Secondary | ICD-10-CM | POA: Diagnosis not present

## 2023-07-18 DIAGNOSIS — Z86718 Personal history of other venous thrombosis and embolism: Secondary | ICD-10-CM | POA: Insufficient documentation

## 2023-07-18 DIAGNOSIS — Z7901 Long term (current) use of anticoagulants: Secondary | ICD-10-CM | POA: Diagnosis not present

## 2023-07-18 DIAGNOSIS — D7581 Myelofibrosis: Secondary | ICD-10-CM | POA: Insufficient documentation

## 2023-07-18 NOTE — Progress Notes (Signed)
HEMATOLOGY/ONCOLOGY CLINIC VISIT NOTE  Date of Service: 07/18/2023   Patient Care Team: Noberto Retort, MD as PCP - General (Family Medicine)  CHIEF COMPLAINTS/PURPOSE OF CONSULTATION:  F/u for continued mx of Polycythemia vera and newly diagnosed post PV secondary myelofibrosis.  HISTORY OF PRESENTING ILLNESS:   Please see previous note for details on initial presentation  INTERVAL HISTORY:   Dillon Hill is a 77 y.o. male here for continued evaluation and management of Polycythemia vera. Patient was last seen by me on 06/05/2023 and complained of mild bone pain.   He was seen by Dr. Dimas Aguas on 06/10/2023 and complained of difficulty sleeping, upper chest pain, and balance issues. Patient was started on allopurinol. There were discussions of treatment and management of disease prior to transplant with clinical trial or SOC medications. He was advised to use temazepam PRN and resume Remeron for at least 2 mos.   Patient was seen by Dr. Grace Bushy on 06/17/2023 and reported improved sleeping habits and energy levels.  Today, he reports that he was started on Jakafi and received a blood transfusion on Monday at [facility]. He is not receiving retacrit shots at this time. Patient notes that he has finished Prednisone.   Patient denies any bleeding issues with Eliquis. Patient has abdominal pain once in a while and denies any leg swelling or infection issues. He does endorse lightheadedness/dizziness sometimes with low hemoglobin levels, though he denies any symptoms at this time.   MEDICAL HISTORY:  Past Medical History:  Diagnosis Date   BPH (benign prostatic hyperplasia)    Colon polyp    DDD (degenerative disc disease), cervical    High cholesterol    Migraine    Polycythemia vera (HCC)     SURGICAL HISTORY: Past Surgical History:  Procedure Laterality Date   EYE SURGERY      SOCIAL HISTORY: Social History   Socioeconomic History   Marital status: Widowed     Spouse name: Not on file   Number of children: Not on file   Years of education: Not on file   Highest education level: Not on file  Occupational History   Not on file  Tobacco Use   Smoking status: Never   Smokeless tobacco: Never   Tobacco comments:    smoked x 1 year at age 77  Vaping Use   Vaping status: Never Used  Substance and Sexual Activity   Alcohol use: Yes    Comment: occ   Drug use: Never   Sexual activity: Not on file  Other Topics Concern   Not on file  Social History Narrative   Not on file   Social Determinants of Health   Financial Resource Strain: Not on file  Food Insecurity: Not on file  Transportation Needs: Not on file  Physical Activity: Not on file  Stress: Not on file  Social Connections: Not on file  Intimate Partner Violence: Not on file    FAMILY HISTORY: No family history on file.  ALLERGIES:  has No Known Allergies.  MEDICATIONS:  Current Outpatient Medications  Medication Sig Dispense Refill   Cyanocobalamin (B-12) 1000 MCG SUBL Place 1,000 mcg under the tongue daily. 30 tablet 5   ELIQUIS 5 MG TABS tablet TAKE 1 TABLET(5 MG) BY MOUTH TWICE DAILY 60 tablet 2   folic acid (FOLVITE) 1 MG tablet TAKE 1 TABLET(1 MG) BY MOUTH DAILY 90 tablet 0   LORazepam (ATIVAN) 0.5 MG tablet Take 1 tablet (0.5 mg total) by  mouth at bedtime as needed for anxiety or sleep. 30 tablet 0   Multiple Vitamin (MULTIVITAMIN WITH MINERALS) TABS tablet Take 1 tablet by mouth daily.     traZODone (DESYREL) 100 MG tablet Take 1 tablet (100 mg total) by mouth at bedtime. 30 tablet 1   No current facility-administered medications for this visit.    REVIEW OF SYSTEMS:    10 Point review of Systems was done is negative except as noted above.   PHYSICAL EXAMINATION: .BP 114/67 (BP Location: Left Arm, Patient Position: Sitting)   Pulse 83   Temp 98.3 F (36.8 C) (Oral)   Resp 16   Wt 149 lb 14.4 oz (68 kg)   SpO2 100%   BMI 21.51 kg/m     GENERAL:alert, in no acute distress and comfortable SKIN: no acute rashes, no significant lesions EYES: conjunctiva are pink and non-injected, sclera anicteric OROPHARYNX: MMM, no exudates, no oropharyngeal erythema or ulceration NECK: supple, no JVD LYMPH:  no palpable lymphadenopathy in the cervical, axillary or inguinal regions LUNGS: clear to auscultation b/l with normal respiratory effort HEART: regular rate & rhythm ABDOMEN:  normoactive bowel sounds , non tender, not distended. Extremity: no pedal edema PSYCH: alert & oriented x 3 with fluent speech NEURO: no focal motor/sensory deficits   LABORATORY DATA:  I have reviewed the data as listed  .    Latest Ref Rng & Units 06/17/2023    7:40 AM 06/05/2023    1:55 PM 06/03/2023    7:37 AM  CBC  WBC 4.0 - 10.5 K/uL 43.8  38.3  31.6   Hemoglobin 13.0 - 17.0 g/dL 7.0  8.3  8.3   Hematocrit 39.0 - 52.0 % 21.9  26.1  25.9   Platelets 150 - 400 K/uL 63  70  75     .    Latest Ref Rng & Units 06/17/2023    7:40 AM 06/05/2023    1:55 PM 06/03/2023    7:37 AM  CMP  Glucose 70 - 99 mg/dL 846  85  84   BUN 8 - 23 mg/dL 32  25  20   Creatinine 0.61 - 1.24 mg/dL 9.62  9.52  8.41   Sodium 135 - 145 mmol/L 140  138  141   Potassium 3.5 - 5.1 mmol/L 3.7  4.2  4.1   Chloride 98 - 111 mmol/L 107  105  106   CO2 22 - 32 mmol/L 25  25  29    Calcium 8.9 - 10.3 mg/dL 9.6  32.4  9.7   Total Protein 6.5 - 8.1 g/dL 6.8  8.0  7.4   Total Bilirubin 0.3 - 1.2 mg/dL 0.7  0.7  0.7   Alkaline Phos 38 - 126 U/L 223  288  211   AST 15 - 41 U/L 40  34  33   ALT 0 - 44 U/L 15  16  17              01/19/2020 [id number] Flow Pathology (BM Bx):   01/19/2020 [id number] BM Bx Report:    10/24/2019 JAK2 mutation testing:    RADIOGRAPHIC STUDIES: I have personally reviewed the radiological images as listed and agreed with the findings in the report. No results found.  ASSESSMENT & PLAN:   77 y.o. male with:  #1  JAK2 positive  polycythemia vera  severe thrombocytosis with platelets of 1005k in diagnosis 10/24/2019 JAK2 GenotypR is "POSITIVE for the detection of the V617F mutation"  #2  History  of Dural venous sinus thrombosis -likely triggered by his polycythemia and thrombocytosis.  Evaluation   01/19/2020 BM Bx ([id number]) revealed "BONE MARROW, ASPIRATE, CLOT, CORE:  -  Normocellular marrow with megakaryocytic hyperplasia and atypia  -  Documented JAK-2 (V617F) mutation. PERIPHERAL BLOOD: -  Pancytopenia."  PLAN:   -platelets were previously 63K on 06/17/2023 and 39K on 07/08/2023 -platelets have lowered since recently starting Jakafi -will stop Eliquis at this time as the risk of bleeding outweighs the risk of having a blood clot.and patient is no longer polycythemic  -Will stop Trazodone since patient was started on Remeron per Dr. Jeannette How note -Will stop Lorazepam as patient is taking Temazepam as needed per Dr. Jeannette How note -continue vitamin B complex supplements -scheduled for lab work tomorrow -educated patient that allopurinol is used for preventing gout and tumor lysis syndrome -patient shall transfer care to [facility] for transfusion support, monitoring Jakafi, and consideration of clinical trials and transplant -return to clinic as needed  FOLLOW-UP: Continue follow-up with Dr. Denna Haggard at [facility] for continued management of secondary myelofibrosis transformed from polycythemia vera We shall see the patient back as needed  The total time spent in the appointment was 30 minutes* .  All of the patient's questions were answered with apparent satisfaction. The patient knows to call the clinic with any problems, questions or concerns.   Wyvonnia Lora MD MS AAHIVMS [facility] Hematology/Oncology Physician [facility]  .*Total Encounter Time as defined by the Centers for Medicare and Medicaid Services includes, in addition to the face-to-face time of a patient  visit (documented in the note above) non-face-to-face time: obtaining and reviewing outside history, ordering and reviewing medications, tests or procedures, care coordination (communications with other health care professionals or caregivers) and documentation in the medical record.    I,Mitra Faeizi,acting as a Neurosurgeon for Wyvonnia Lora, MD.,have documented all relevant documentation on the behalf of Wyvonnia Lora, MD,as directed by  Wyvonnia Lora, MD while in the presence of Wyvonnia Lora, MD.  .I have reviewed the above documentation for accuracy and completeness, and I agree with the above. Johney Maine MD

## 2023-07-19 DIAGNOSIS — D45 Polycythemia vera: Secondary | ICD-10-CM | POA: Diagnosis not present

## 2023-07-19 DIAGNOSIS — D7581 Myelofibrosis: Secondary | ICD-10-CM | POA: Diagnosis not present

## 2023-07-19 DIAGNOSIS — D471 Chronic myeloproliferative disease: Secondary | ICD-10-CM | POA: Diagnosis not present

## 2023-07-19 DIAGNOSIS — D5911 Warm autoimmune hemolytic anemia: Secondary | ICD-10-CM | POA: Diagnosis not present

## 2023-07-24 ENCOUNTER — Encounter: Payer: Self-pay | Admitting: Hematology

## 2023-07-24 DIAGNOSIS — D471 Chronic myeloproliferative disease: Secondary | ICD-10-CM | POA: Diagnosis not present

## 2023-07-29 ENCOUNTER — Other Ambulatory Visit: Payer: Medicare PPO

## 2023-07-29 DIAGNOSIS — D7581 Myelofibrosis: Secondary | ICD-10-CM | POA: Diagnosis not present

## 2023-07-29 DIAGNOSIS — D45 Polycythemia vera: Secondary | ICD-10-CM | POA: Diagnosis not present

## 2023-07-29 DIAGNOSIS — D5911 Warm autoimmune hemolytic anemia: Secondary | ICD-10-CM | POA: Diagnosis not present

## 2023-07-29 DIAGNOSIS — D471 Chronic myeloproliferative disease: Secondary | ICD-10-CM | POA: Diagnosis not present

## 2023-08-01 DIAGNOSIS — Z23 Encounter for immunization: Secondary | ICD-10-CM | POA: Diagnosis not present

## 2023-08-01 DIAGNOSIS — D471 Chronic myeloproliferative disease: Secondary | ICD-10-CM | POA: Diagnosis not present

## 2023-08-01 DIAGNOSIS — Z1211 Encounter for screening for malignant neoplasm of colon: Secondary | ICD-10-CM | POA: Diagnosis not present

## 2023-08-01 DIAGNOSIS — D7581 Myelofibrosis: Secondary | ICD-10-CM | POA: Diagnosis not present

## 2023-08-01 DIAGNOSIS — L603 Nail dystrophy: Secondary | ICD-10-CM | POA: Diagnosis not present

## 2023-08-01 DIAGNOSIS — Z9181 History of falling: Secondary | ICD-10-CM | POA: Diagnosis not present

## 2023-08-01 DIAGNOSIS — Z0000 Encounter for general adult medical examination without abnormal findings: Secondary | ICD-10-CM | POA: Diagnosis not present

## 2023-08-01 DIAGNOSIS — E46 Unspecified protein-calorie malnutrition: Secondary | ICD-10-CM | POA: Diagnosis not present

## 2023-08-01 DIAGNOSIS — E7800 Pure hypercholesterolemia, unspecified: Secondary | ICD-10-CM | POA: Diagnosis not present

## 2023-08-05 DIAGNOSIS — D45 Polycythemia vera: Secondary | ICD-10-CM | POA: Diagnosis not present

## 2023-08-05 DIAGNOSIS — D471 Chronic myeloproliferative disease: Secondary | ICD-10-CM | POA: Diagnosis not present

## 2023-08-05 DIAGNOSIS — D5911 Warm autoimmune hemolytic anemia: Secondary | ICD-10-CM | POA: Diagnosis not present

## 2023-08-05 DIAGNOSIS — D7581 Myelofibrosis: Secondary | ICD-10-CM | POA: Diagnosis not present

## 2023-08-06 ENCOUNTER — Other Ambulatory Visit: Payer: Self-pay | Admitting: Hematology

## 2023-08-06 DIAGNOSIS — D45 Polycythemia vera: Secondary | ICD-10-CM

## 2023-08-08 ENCOUNTER — Encounter: Payer: Self-pay | Admitting: Hematology

## 2023-08-12 ENCOUNTER — Other Ambulatory Visit: Payer: Medicare PPO

## 2023-08-12 DIAGNOSIS — D7581 Myelofibrosis: Secondary | ICD-10-CM | POA: Diagnosis not present

## 2023-08-12 DIAGNOSIS — D5911 Warm autoimmune hemolytic anemia: Secondary | ICD-10-CM | POA: Diagnosis not present

## 2023-08-12 DIAGNOSIS — D45 Polycythemia vera: Secondary | ICD-10-CM | POA: Diagnosis not present

## 2023-08-12 DIAGNOSIS — D471 Chronic myeloproliferative disease: Secondary | ICD-10-CM | POA: Diagnosis not present

## 2023-08-15 ENCOUNTER — Ambulatory Visit: Payer: Medicare PPO | Admitting: Podiatry

## 2023-08-15 ENCOUNTER — Encounter: Payer: Self-pay | Admitting: Podiatry

## 2023-08-15 DIAGNOSIS — L603 Nail dystrophy: Secondary | ICD-10-CM | POA: Diagnosis not present

## 2023-08-15 NOTE — Progress Notes (Signed)
Subjective:  Patient ID: Dillon Hill, male    DOB: 23-Jan-1946,   MRN: [id number]  Chief Complaint  Patient presents with   Nail Problem    Pt presents for his great toenail on the right foot that he injured a while ago and it turned back and somehow loose.    77 y.o. male presents for concern of a darkened loose right great toe. Relates a while back several months ago he did drop something on the toe. Relates there was bruising underneath the nail. Wanted to make sure he didn't have any infection.  . Denies any other pedal complaints. Denies n/v/f/c.   Past Medical History:  Diagnosis Date   BPH (benign prostatic hyperplasia)    Colon polyp    DDD (degenerative disc disease), cervical    High cholesterol    Migraine    Polycythemia vera (HCC)     Objective:  Physical Exam: Vascular: DP/PT pulses 2/4 bilateral. CFT <3 seconds. Normal hair growth on digits. No edema.  Skin. No lacerations or abrasions bilateral feet. Right hallux nail thickened with subungual debris and loosening from distal nail bed.  Musculoskeletal: MMT 5/5 bilateral lower extremities in DF, PF, Inversion and Eversion. Deceased ROM in DF of ankle joint.  Neurological: Sensation intact to light touch.   Assessment:   1. Onychodystrophy      Plan:  Patient was evaluated and treated and all questions answered. -Examined patient -Discussed treatment options for painful dystrophic nails  -Trimmed nail back as courtesy.  -Discussed potential outcomes of the nail and possible need for removal in the future. Discussed time will tell at this point.  -Advised on use of vicks or urea nail gel to aid in nail health. .  -Patient to return as needed   Louann Sjogren, DPM

## 2023-08-19 DIAGNOSIS — D45 Polycythemia vera: Secondary | ICD-10-CM | POA: Diagnosis not present

## 2023-08-19 DIAGNOSIS — D5911 Warm autoimmune hemolytic anemia: Secondary | ICD-10-CM | POA: Diagnosis not present

## 2023-08-19 DIAGNOSIS — D7581 Myelofibrosis: Secondary | ICD-10-CM | POA: Diagnosis not present

## 2023-08-19 DIAGNOSIS — D471 Chronic myeloproliferative disease: Secondary | ICD-10-CM | POA: Diagnosis not present

## 2023-08-26 ENCOUNTER — Other Ambulatory Visit: Payer: Medicare PPO

## 2023-08-26 DIAGNOSIS — D45 Polycythemia vera: Secondary | ICD-10-CM | POA: Diagnosis not present

## 2023-08-26 DIAGNOSIS — D7581 Myelofibrosis: Secondary | ICD-10-CM | POA: Diagnosis not present

## 2023-09-02 DIAGNOSIS — D7581 Myelofibrosis: Secondary | ICD-10-CM | POA: Diagnosis not present

## 2023-09-02 DIAGNOSIS — D45 Polycythemia vera: Secondary | ICD-10-CM | POA: Diagnosis not present

## 2023-09-02 DIAGNOSIS — Z01818 Encounter for other preprocedural examination: Secondary | ICD-10-CM | POA: Diagnosis not present

## 2023-09-09 ENCOUNTER — Other Ambulatory Visit: Payer: Medicare PPO

## 2023-09-09 DIAGNOSIS — D471 Chronic myeloproliferative disease: Secondary | ICD-10-CM | POA: Diagnosis not present

## 2023-09-09 DIAGNOSIS — D45 Polycythemia vera: Secondary | ICD-10-CM | POA: Diagnosis not present

## 2023-09-09 DIAGNOSIS — D7581 Myelofibrosis: Secondary | ICD-10-CM | POA: Diagnosis not present

## 2023-09-11 DIAGNOSIS — Z01810 Encounter for preprocedural cardiovascular examination: Secondary | ICD-10-CM | POA: Diagnosis not present

## 2023-09-11 DIAGNOSIS — Z01818 Encounter for other preprocedural examination: Secondary | ICD-10-CM | POA: Diagnosis not present

## 2023-09-11 DIAGNOSIS — D7581 Myelofibrosis: Secondary | ICD-10-CM | POA: Diagnosis not present

## 2023-09-11 DIAGNOSIS — I082 Rheumatic disorders of both aortic and tricuspid valves: Secondary | ICD-10-CM | POA: Diagnosis not present

## 2023-09-11 DIAGNOSIS — Z79899 Other long term (current) drug therapy: Secondary | ICD-10-CM | POA: Diagnosis not present

## 2023-09-11 DIAGNOSIS — D45 Polycythemia vera: Secondary | ICD-10-CM | POA: Diagnosis not present

## 2023-09-11 DIAGNOSIS — I083 Combined rheumatic disorders of mitral, aortic and tricuspid valves: Secondary | ICD-10-CM | POA: Diagnosis not present

## 2023-09-11 DIAGNOSIS — Z9484 Stem cells transplant status: Secondary | ICD-10-CM | POA: Diagnosis not present

## 2023-09-12 DIAGNOSIS — Z01810 Encounter for preprocedural cardiovascular examination: Secondary | ICD-10-CM | POA: Diagnosis not present

## 2023-09-13 DIAGNOSIS — Z01818 Encounter for other preprocedural examination: Secondary | ICD-10-CM | POA: Diagnosis not present

## 2023-09-16 DIAGNOSIS — D471 Chronic myeloproliferative disease: Secondary | ICD-10-CM | POA: Diagnosis not present

## 2023-09-17 DIAGNOSIS — Z0189 Encounter for other specified special examinations: Secondary | ICD-10-CM | POA: Diagnosis not present

## 2023-09-17 DIAGNOSIS — D45 Polycythemia vera: Secondary | ICD-10-CM | POA: Diagnosis not present

## 2023-09-17 DIAGNOSIS — D7581 Myelofibrosis: Secondary | ICD-10-CM | POA: Diagnosis not present

## 2023-09-23 ENCOUNTER — Other Ambulatory Visit: Payer: Medicare PPO

## 2023-09-23 DIAGNOSIS — D471 Chronic myeloproliferative disease: Secondary | ICD-10-CM | POA: Diagnosis not present

## 2023-09-30 DIAGNOSIS — D471 Chronic myeloproliferative disease: Secondary | ICD-10-CM | POA: Diagnosis not present

## 2023-10-04 DIAGNOSIS — D7581 Myelofibrosis: Secondary | ICD-10-CM | POA: Diagnosis not present

## 2023-10-04 DIAGNOSIS — D45 Polycythemia vera: Secondary | ICD-10-CM | POA: Diagnosis not present

## 2023-10-04 DIAGNOSIS — D471 Chronic myeloproliferative disease: Secondary | ICD-10-CM | POA: Diagnosis not present

## 2023-10-04 DIAGNOSIS — D5911 Warm autoimmune hemolytic anemia: Secondary | ICD-10-CM | POA: Diagnosis not present

## 2023-10-07 ENCOUNTER — Other Ambulatory Visit: Payer: Medicare PPO

## 2023-10-07 DIAGNOSIS — D7581 Myelofibrosis: Secondary | ICD-10-CM | POA: Diagnosis not present

## 2023-10-07 DIAGNOSIS — D471 Chronic myeloproliferative disease: Secondary | ICD-10-CM | POA: Diagnosis not present

## 2023-10-07 DIAGNOSIS — D45 Polycythemia vera: Secondary | ICD-10-CM | POA: Diagnosis not present

## 2023-10-07 DIAGNOSIS — Z01818 Encounter for other preprocedural examination: Secondary | ICD-10-CM | POA: Diagnosis not present

## 2023-10-14 DIAGNOSIS — D5911 Warm autoimmune hemolytic anemia: Secondary | ICD-10-CM | POA: Diagnosis not present

## 2023-10-14 DIAGNOSIS — D471 Chronic myeloproliferative disease: Secondary | ICD-10-CM | POA: Diagnosis not present

## 2023-10-15 DIAGNOSIS — D45 Polycythemia vera: Secondary | ICD-10-CM | POA: Diagnosis not present

## 2023-10-15 DIAGNOSIS — D471 Chronic myeloproliferative disease: Secondary | ICD-10-CM | POA: Diagnosis not present

## 2023-10-21 DIAGNOSIS — D471 Chronic myeloproliferative disease: Secondary | ICD-10-CM | POA: Diagnosis not present

## 2023-10-28 DIAGNOSIS — D471 Chronic myeloproliferative disease: Secondary | ICD-10-CM | POA: Diagnosis not present

## 2023-11-04 DIAGNOSIS — D7581 Myelofibrosis: Secondary | ICD-10-CM | POA: Diagnosis not present

## 2023-11-04 DIAGNOSIS — D45 Polycythemia vera: Secondary | ICD-10-CM | POA: Diagnosis not present

## 2023-11-11 DIAGNOSIS — D471 Chronic myeloproliferative disease: Secondary | ICD-10-CM | POA: Diagnosis not present

## 2023-11-17 DIAGNOSIS — J029 Acute pharyngitis, unspecified: Secondary | ICD-10-CM | POA: Diagnosis not present

## 2023-11-17 DIAGNOSIS — B349 Viral infection, unspecified: Secondary | ICD-10-CM | POA: Diagnosis not present

## 2023-11-17 DIAGNOSIS — Z20822 Contact with and (suspected) exposure to covid-19: Secondary | ICD-10-CM | POA: Diagnosis not present

## 2023-11-18 DIAGNOSIS — D471 Chronic myeloproliferative disease: Secondary | ICD-10-CM | POA: Diagnosis not present

## 2023-11-18 DIAGNOSIS — D7581 Myelofibrosis: Secondary | ICD-10-CM | POA: Diagnosis not present

## 2023-11-18 DIAGNOSIS — Z01818 Encounter for other preprocedural examination: Secondary | ICD-10-CM | POA: Diagnosis not present

## 2023-11-18 DIAGNOSIS — D45 Polycythemia vera: Secondary | ICD-10-CM | POA: Diagnosis not present

## 2023-11-18 DIAGNOSIS — R0602 Shortness of breath: Secondary | ICD-10-CM | POA: Diagnosis not present

## 2023-11-18 DIAGNOSIS — D849 Immunodeficiency, unspecified: Secondary | ICD-10-CM | POA: Diagnosis not present

## 2023-11-25 DIAGNOSIS — D471 Chronic myeloproliferative disease: Secondary | ICD-10-CM | POA: Diagnosis not present

## 2023-12-02 DIAGNOSIS — Z7682 Awaiting organ transplant status: Secondary | ICD-10-CM | POA: Diagnosis not present

## 2023-12-02 DIAGNOSIS — Z9484 Stem cells transplant status: Secondary | ICD-10-CM | POA: Diagnosis not present

## 2023-12-02 DIAGNOSIS — Z5111 Encounter for antineoplastic chemotherapy: Secondary | ICD-10-CM | POA: Diagnosis not present

## 2023-12-02 DIAGNOSIS — D7581 Myelofibrosis: Secondary | ICD-10-CM | POA: Diagnosis not present

## 2023-12-02 DIAGNOSIS — Z01818 Encounter for other preprocedural examination: Secondary | ICD-10-CM | POA: Diagnosis not present

## 2023-12-02 DIAGNOSIS — D45 Polycythemia vera: Secondary | ICD-10-CM | POA: Diagnosis not present

## 2023-12-02 DIAGNOSIS — D471 Chronic myeloproliferative disease: Secondary | ICD-10-CM | POA: Diagnosis not present

## 2023-12-03 DIAGNOSIS — L03818 Cellulitis of other sites: Secondary | ICD-10-CM | POA: Diagnosis not present

## 2023-12-03 DIAGNOSIS — R768 Other specified abnormal immunological findings in serum: Secondary | ICD-10-CM | POA: Diagnosis not present

## 2023-12-03 DIAGNOSIS — R918 Other nonspecific abnormal finding of lung field: Secondary | ICD-10-CM | POA: Diagnosis not present

## 2023-12-03 DIAGNOSIS — M25562 Pain in left knee: Secondary | ICD-10-CM | POA: Diagnosis not present

## 2023-12-03 DIAGNOSIS — I159 Secondary hypertension, unspecified: Secondary | ICD-10-CM | POA: Diagnosis not present

## 2023-12-03 DIAGNOSIS — I493 Ventricular premature depolarization: Secondary | ICD-10-CM | POA: Diagnosis not present

## 2023-12-03 DIAGNOSIS — I491 Atrial premature depolarization: Secondary | ICD-10-CM | POA: Diagnosis not present

## 2023-12-03 DIAGNOSIS — D485 Neoplasm of uncertain behavior of skin: Secondary | ICD-10-CM | POA: Diagnosis not present

## 2023-12-03 DIAGNOSIS — D45 Polycythemia vera: Secondary | ICD-10-CM | POA: Diagnosis not present

## 2023-12-03 DIAGNOSIS — R519 Headache, unspecified: Secondary | ICD-10-CM | POA: Diagnosis not present

## 2023-12-03 DIAGNOSIS — Z4659 Encounter for fitting and adjustment of other gastrointestinal appliance and device: Secondary | ICD-10-CM | POA: Diagnosis not present

## 2023-12-03 DIAGNOSIS — L03113 Cellulitis of right upper limb: Secondary | ICD-10-CM | POA: Diagnosis not present

## 2023-12-03 DIAGNOSIS — M7989 Other specified soft tissue disorders: Secondary | ICD-10-CM | POA: Diagnosis not present

## 2023-12-03 DIAGNOSIS — I4891 Unspecified atrial fibrillation: Secondary | ICD-10-CM | POA: Diagnosis not present

## 2023-12-03 DIAGNOSIS — I110 Hypertensive heart disease with heart failure: Secondary | ICD-10-CM | POA: Diagnosis not present

## 2023-12-03 DIAGNOSIS — D709 Neutropenia, unspecified: Secondary | ICD-10-CM | POA: Diagnosis not present

## 2023-12-03 DIAGNOSIS — I4892 Unspecified atrial flutter: Secondary | ICD-10-CM | POA: Diagnosis not present

## 2023-12-03 DIAGNOSIS — L0291 Cutaneous abscess, unspecified: Secondary | ICD-10-CM | POA: Diagnosis not present

## 2023-12-03 DIAGNOSIS — R2231 Localized swelling, mass and lump, right upper limb: Secondary | ICD-10-CM | POA: Diagnosis not present

## 2023-12-03 DIAGNOSIS — I517 Cardiomegaly: Secondary | ICD-10-CM | POA: Diagnosis not present

## 2023-12-03 DIAGNOSIS — D849 Immunodeficiency, unspecified: Secondary | ICD-10-CM | POA: Diagnosis not present

## 2023-12-03 DIAGNOSIS — N281 Cyst of kidney, acquired: Secondary | ICD-10-CM | POA: Diagnosis not present

## 2023-12-03 DIAGNOSIS — I5021 Acute systolic (congestive) heart failure: Secondary | ICD-10-CM | POA: Diagnosis not present

## 2023-12-03 DIAGNOSIS — R140 Abdominal distension (gaseous): Secondary | ICD-10-CM | POA: Diagnosis not present

## 2023-12-03 DIAGNOSIS — I77810 Thoracic aortic ectasia: Secondary | ICD-10-CM | POA: Diagnosis not present

## 2023-12-03 DIAGNOSIS — K1230 Oral mucositis (ulcerative), unspecified: Secondary | ICD-10-CM | POA: Diagnosis not present

## 2023-12-03 DIAGNOSIS — L02413 Cutaneous abscess of right upper limb: Secondary | ICD-10-CM | POA: Diagnosis not present

## 2023-12-03 DIAGNOSIS — Z9484 Stem cells transplant status: Secondary | ICD-10-CM | POA: Diagnosis not present

## 2023-12-03 DIAGNOSIS — D7581 Myelofibrosis: Secondary | ICD-10-CM | POA: Diagnosis not present

## 2023-12-03 DIAGNOSIS — R079 Chest pain, unspecified: Secondary | ICD-10-CM | POA: Diagnosis not present

## 2023-12-03 DIAGNOSIS — J810 Acute pulmonary edema: Secondary | ICD-10-CM | POA: Diagnosis not present

## 2023-12-03 DIAGNOSIS — I509 Heart failure, unspecified: Secondary | ICD-10-CM | POA: Diagnosis not present

## 2023-12-03 DIAGNOSIS — I808 Phlebitis and thrombophlebitis of other sites: Secondary | ICD-10-CM | POA: Diagnosis not present

## 2023-12-03 DIAGNOSIS — D471 Chronic myeloproliferative disease: Secondary | ICD-10-CM | POA: Diagnosis not present

## 2023-12-03 DIAGNOSIS — L309 Dermatitis, unspecified: Secondary | ICD-10-CM | POA: Diagnosis not present

## 2023-12-03 DIAGNOSIS — I8290 Acute embolism and thrombosis of unspecified vein: Secondary | ICD-10-CM | POA: Diagnosis not present

## 2023-12-03 DIAGNOSIS — I82611 Acute embolism and thrombosis of superficial veins of right upper extremity: Secondary | ICD-10-CM | POA: Diagnosis not present

## 2023-12-03 DIAGNOSIS — R188 Other ascites: Secondary | ICD-10-CM | POA: Diagnosis not present

## 2023-12-03 DIAGNOSIS — I499 Cardiac arrhythmia, unspecified: Secondary | ICD-10-CM | POA: Diagnosis not present

## 2023-12-03 DIAGNOSIS — Z9489 Other transplanted organ and tissue status: Secondary | ICD-10-CM | POA: Diagnosis not present

## 2023-12-03 DIAGNOSIS — J9811 Atelectasis: Secondary | ICD-10-CM | POA: Diagnosis not present

## 2023-12-03 DIAGNOSIS — J90 Pleural effusion, not elsewhere classified: Secondary | ICD-10-CM | POA: Diagnosis not present

## 2023-12-03 DIAGNOSIS — M25462 Effusion, left knee: Secondary | ICD-10-CM | POA: Diagnosis not present

## 2023-12-04 DIAGNOSIS — D471 Chronic myeloproliferative disease: Secondary | ICD-10-CM | POA: Diagnosis not present

## 2023-12-05 DIAGNOSIS — D471 Chronic myeloproliferative disease: Secondary | ICD-10-CM | POA: Diagnosis not present

## 2023-12-06 DIAGNOSIS — D471 Chronic myeloproliferative disease: Secondary | ICD-10-CM | POA: Diagnosis not present

## 2023-12-07 DIAGNOSIS — D7581 Myelofibrosis: Secondary | ICD-10-CM | POA: Diagnosis not present

## 2023-12-07 DIAGNOSIS — D45 Polycythemia vera: Secondary | ICD-10-CM | POA: Diagnosis not present

## 2023-12-08 DIAGNOSIS — D45 Polycythemia vera: Secondary | ICD-10-CM | POA: Diagnosis not present

## 2023-12-08 DIAGNOSIS — D7581 Myelofibrosis: Secondary | ICD-10-CM | POA: Diagnosis not present

## 2023-12-09 DIAGNOSIS — D7581 Myelofibrosis: Secondary | ICD-10-CM | POA: Diagnosis not present

## 2023-12-09 DIAGNOSIS — D45 Polycythemia vera: Secondary | ICD-10-CM | POA: Diagnosis not present

## 2023-12-10 DIAGNOSIS — D471 Chronic myeloproliferative disease: Secondary | ICD-10-CM | POA: Diagnosis not present

## 2023-12-11 DIAGNOSIS — Z9484 Stem cells transplant status: Secondary | ICD-10-CM | POA: Diagnosis not present

## 2023-12-11 DIAGNOSIS — D471 Chronic myeloproliferative disease: Secondary | ICD-10-CM | POA: Diagnosis not present

## 2023-12-12 DIAGNOSIS — D471 Chronic myeloproliferative disease: Secondary | ICD-10-CM | POA: Diagnosis not present

## 2023-12-13 DIAGNOSIS — D471 Chronic myeloproliferative disease: Secondary | ICD-10-CM | POA: Diagnosis not present

## 2023-12-14 DIAGNOSIS — D471 Chronic myeloproliferative disease: Secondary | ICD-10-CM | POA: Diagnosis not present

## 2023-12-15 DIAGNOSIS — D471 Chronic myeloproliferative disease: Secondary | ICD-10-CM | POA: Diagnosis not present

## 2023-12-16 DIAGNOSIS — D471 Chronic myeloproliferative disease: Secondary | ICD-10-CM | POA: Diagnosis not present

## 2023-12-17 DIAGNOSIS — I4891 Unspecified atrial fibrillation: Secondary | ICD-10-CM | POA: Diagnosis not present

## 2023-12-17 DIAGNOSIS — D471 Chronic myeloproliferative disease: Secondary | ICD-10-CM | POA: Diagnosis not present

## 2023-12-17 DIAGNOSIS — R079 Chest pain, unspecified: Secondary | ICD-10-CM | POA: Diagnosis not present

## 2023-12-17 DIAGNOSIS — I493 Ventricular premature depolarization: Secondary | ICD-10-CM | POA: Diagnosis not present

## 2023-12-17 DIAGNOSIS — I499 Cardiac arrhythmia, unspecified: Secondary | ICD-10-CM | POA: Diagnosis not present

## 2023-12-17 DIAGNOSIS — I491 Atrial premature depolarization: Secondary | ICD-10-CM | POA: Diagnosis not present

## 2024-01-15 DIAGNOSIS — E785 Hyperlipidemia, unspecified: Secondary | ICD-10-CM | POA: Diagnosis not present

## 2024-01-15 DIAGNOSIS — M47812 Spondylosis without myelopathy or radiculopathy, cervical region: Secondary | ICD-10-CM | POA: Diagnosis not present

## 2024-01-15 DIAGNOSIS — Z452 Encounter for adjustment and management of vascular access device: Secondary | ICD-10-CM | POA: Diagnosis not present

## 2024-01-15 DIAGNOSIS — I4891 Unspecified atrial fibrillation: Secondary | ICD-10-CM | POA: Diagnosis not present

## 2024-01-15 DIAGNOSIS — D471 Chronic myeloproliferative disease: Secondary | ICD-10-CM | POA: Diagnosis not present

## 2024-01-15 DIAGNOSIS — N39498 Other specified urinary incontinence: Secondary | ICD-10-CM | POA: Diagnosis not present

## 2024-01-15 DIAGNOSIS — D630 Anemia in neoplastic disease: Secondary | ICD-10-CM | POA: Diagnosis not present

## 2024-01-15 DIAGNOSIS — D696 Thrombocytopenia, unspecified: Secondary | ICD-10-CM | POA: Diagnosis not present

## 2024-01-15 DIAGNOSIS — N401 Enlarged prostate with lower urinary tract symptoms: Secondary | ICD-10-CM | POA: Diagnosis not present

## 2024-01-15 DIAGNOSIS — I951 Orthostatic hypotension: Secondary | ICD-10-CM | POA: Diagnosis not present

## 2024-01-16 DIAGNOSIS — D45 Polycythemia vera: Secondary | ICD-10-CM | POA: Diagnosis not present

## 2024-01-16 DIAGNOSIS — D7581 Myelofibrosis: Secondary | ICD-10-CM | POA: Diagnosis not present

## 2024-01-16 DIAGNOSIS — E785 Hyperlipidemia, unspecified: Secondary | ICD-10-CM | POA: Diagnosis not present

## 2024-01-16 DIAGNOSIS — N39498 Other specified urinary incontinence: Secondary | ICD-10-CM | POA: Diagnosis not present

## 2024-01-16 DIAGNOSIS — R600 Localized edema: Secondary | ICD-10-CM | POA: Diagnosis not present

## 2024-01-16 DIAGNOSIS — Z9481 Bone marrow transplant status: Secondary | ICD-10-CM | POA: Diagnosis not present

## 2024-01-16 DIAGNOSIS — D471 Chronic myeloproliferative disease: Secondary | ICD-10-CM | POA: Diagnosis not present

## 2024-01-16 DIAGNOSIS — I951 Orthostatic hypotension: Secondary | ICD-10-CM | POA: Diagnosis not present

## 2024-01-16 DIAGNOSIS — D5911 Warm autoimmune hemolytic anemia: Secondary | ICD-10-CM | POA: Diagnosis not present

## 2024-01-16 DIAGNOSIS — M25562 Pain in left knee: Secondary | ICD-10-CM | POA: Diagnosis not present

## 2024-01-16 DIAGNOSIS — N401 Enlarged prostate with lower urinary tract symptoms: Secondary | ICD-10-CM | POA: Diagnosis not present

## 2024-01-16 DIAGNOSIS — I4891 Unspecified atrial fibrillation: Secondary | ICD-10-CM | POA: Diagnosis not present

## 2024-01-16 DIAGNOSIS — D696 Thrombocytopenia, unspecified: Secondary | ICD-10-CM | POA: Diagnosis not present

## 2024-01-16 DIAGNOSIS — M47812 Spondylosis without myelopathy or radiculopathy, cervical region: Secondary | ICD-10-CM | POA: Diagnosis not present

## 2024-01-16 DIAGNOSIS — D630 Anemia in neoplastic disease: Secondary | ICD-10-CM | POA: Diagnosis not present

## 2024-01-17 DIAGNOSIS — R600 Localized edema: Secondary | ICD-10-CM | POA: Diagnosis not present

## 2024-01-17 DIAGNOSIS — D45 Polycythemia vera: Secondary | ICD-10-CM | POA: Diagnosis not present

## 2024-01-17 DIAGNOSIS — D5911 Warm autoimmune hemolytic anemia: Secondary | ICD-10-CM | POA: Diagnosis not present

## 2024-01-17 DIAGNOSIS — M25562 Pain in left knee: Secondary | ICD-10-CM | POA: Diagnosis not present

## 2024-01-17 DIAGNOSIS — D471 Chronic myeloproliferative disease: Secondary | ICD-10-CM | POA: Diagnosis not present

## 2024-01-17 DIAGNOSIS — Z9481 Bone marrow transplant status: Secondary | ICD-10-CM | POA: Diagnosis not present

## 2024-01-17 DIAGNOSIS — D7581 Myelofibrosis: Secondary | ICD-10-CM | POA: Diagnosis not present

## 2024-01-20 DIAGNOSIS — D45 Polycythemia vera: Secondary | ICD-10-CM | POA: Diagnosis not present

## 2024-01-20 DIAGNOSIS — D5911 Warm autoimmune hemolytic anemia: Secondary | ICD-10-CM | POA: Diagnosis not present

## 2024-01-20 DIAGNOSIS — Z9481 Bone marrow transplant status: Secondary | ICD-10-CM | POA: Diagnosis not present

## 2024-01-20 DIAGNOSIS — D471 Chronic myeloproliferative disease: Secondary | ICD-10-CM | POA: Diagnosis not present

## 2024-01-20 DIAGNOSIS — D7581 Myelofibrosis: Secondary | ICD-10-CM | POA: Diagnosis not present

## 2024-01-22 DIAGNOSIS — M47812 Spondylosis without myelopathy or radiculopathy, cervical region: Secondary | ICD-10-CM | POA: Diagnosis not present

## 2024-01-22 DIAGNOSIS — I951 Orthostatic hypotension: Secondary | ICD-10-CM | POA: Diagnosis not present

## 2024-01-22 DIAGNOSIS — I4891 Unspecified atrial fibrillation: Secondary | ICD-10-CM | POA: Diagnosis not present

## 2024-01-22 DIAGNOSIS — N39498 Other specified urinary incontinence: Secondary | ICD-10-CM | POA: Diagnosis not present

## 2024-01-22 DIAGNOSIS — N401 Enlarged prostate with lower urinary tract symptoms: Secondary | ICD-10-CM | POA: Diagnosis not present

## 2024-01-22 DIAGNOSIS — D696 Thrombocytopenia, unspecified: Secondary | ICD-10-CM | POA: Diagnosis not present

## 2024-01-22 DIAGNOSIS — E785 Hyperlipidemia, unspecified: Secondary | ICD-10-CM | POA: Diagnosis not present

## 2024-01-22 DIAGNOSIS — D471 Chronic myeloproliferative disease: Secondary | ICD-10-CM | POA: Diagnosis not present

## 2024-01-22 DIAGNOSIS — D630 Anemia in neoplastic disease: Secondary | ICD-10-CM | POA: Diagnosis not present

## 2024-01-23 DIAGNOSIS — D471 Chronic myeloproliferative disease: Secondary | ICD-10-CM | POA: Diagnosis not present

## 2024-01-23 DIAGNOSIS — Z9481 Bone marrow transplant status: Secondary | ICD-10-CM | POA: Diagnosis not present

## 2024-01-23 DIAGNOSIS — D5911 Warm autoimmune hemolytic anemia: Secondary | ICD-10-CM | POA: Diagnosis not present

## 2024-01-23 DIAGNOSIS — D45 Polycythemia vera: Secondary | ICD-10-CM | POA: Diagnosis not present

## 2024-01-23 DIAGNOSIS — D7581 Myelofibrosis: Secondary | ICD-10-CM | POA: Diagnosis not present

## 2024-01-24 DIAGNOSIS — M47812 Spondylosis without myelopathy or radiculopathy, cervical region: Secondary | ICD-10-CM | POA: Diagnosis not present

## 2024-01-24 DIAGNOSIS — N401 Enlarged prostate with lower urinary tract symptoms: Secondary | ICD-10-CM | POA: Diagnosis not present

## 2024-01-24 DIAGNOSIS — D471 Chronic myeloproliferative disease: Secondary | ICD-10-CM | POA: Diagnosis not present

## 2024-01-24 DIAGNOSIS — E785 Hyperlipidemia, unspecified: Secondary | ICD-10-CM | POA: Diagnosis not present

## 2024-01-24 DIAGNOSIS — I951 Orthostatic hypotension: Secondary | ICD-10-CM | POA: Diagnosis not present

## 2024-01-24 DIAGNOSIS — D696 Thrombocytopenia, unspecified: Secondary | ICD-10-CM | POA: Diagnosis not present

## 2024-01-24 DIAGNOSIS — N39498 Other specified urinary incontinence: Secondary | ICD-10-CM | POA: Diagnosis not present

## 2024-01-24 DIAGNOSIS — I4891 Unspecified atrial fibrillation: Secondary | ICD-10-CM | POA: Diagnosis not present

## 2024-01-24 DIAGNOSIS — D630 Anemia in neoplastic disease: Secondary | ICD-10-CM | POA: Diagnosis not present

## 2024-01-27 DIAGNOSIS — Z9481 Bone marrow transplant status: Secondary | ICD-10-CM | POA: Diagnosis not present

## 2024-01-27 DIAGNOSIS — D7581 Myelofibrosis: Secondary | ICD-10-CM | POA: Diagnosis not present

## 2024-01-27 DIAGNOSIS — D5911 Warm autoimmune hemolytic anemia: Secondary | ICD-10-CM | POA: Diagnosis not present

## 2024-01-27 DIAGNOSIS — D45 Polycythemia vera: Secondary | ICD-10-CM | POA: Diagnosis not present

## 2024-01-27 DIAGNOSIS — D471 Chronic myeloproliferative disease: Secondary | ICD-10-CM | POA: Diagnosis not present

## 2024-01-29 DIAGNOSIS — I4891 Unspecified atrial fibrillation: Secondary | ICD-10-CM | POA: Diagnosis not present

## 2024-01-29 DIAGNOSIS — N401 Enlarged prostate with lower urinary tract symptoms: Secondary | ICD-10-CM | POA: Diagnosis not present

## 2024-01-29 DIAGNOSIS — M47812 Spondylosis without myelopathy or radiculopathy, cervical region: Secondary | ICD-10-CM | POA: Diagnosis not present

## 2024-01-29 DIAGNOSIS — I951 Orthostatic hypotension: Secondary | ICD-10-CM | POA: Diagnosis not present

## 2024-01-29 DIAGNOSIS — E785 Hyperlipidemia, unspecified: Secondary | ICD-10-CM | POA: Diagnosis not present

## 2024-01-29 DIAGNOSIS — N39498 Other specified urinary incontinence: Secondary | ICD-10-CM | POA: Diagnosis not present

## 2024-01-29 DIAGNOSIS — D696 Thrombocytopenia, unspecified: Secondary | ICD-10-CM | POA: Diagnosis not present

## 2024-01-29 DIAGNOSIS — D630 Anemia in neoplastic disease: Secondary | ICD-10-CM | POA: Diagnosis not present

## 2024-01-29 DIAGNOSIS — D471 Chronic myeloproliferative disease: Secondary | ICD-10-CM | POA: Diagnosis not present

## 2024-01-30 DIAGNOSIS — D45 Polycythemia vera: Secondary | ICD-10-CM | POA: Diagnosis not present

## 2024-01-30 DIAGNOSIS — Z9481 Bone marrow transplant status: Secondary | ICD-10-CM | POA: Diagnosis not present

## 2024-01-30 DIAGNOSIS — M545 Low back pain, unspecified: Secondary | ICD-10-CM | POA: Diagnosis not present

## 2024-01-30 DIAGNOSIS — M546 Pain in thoracic spine: Secondary | ICD-10-CM | POA: Diagnosis not present

## 2024-01-30 DIAGNOSIS — M25552 Pain in left hip: Secondary | ICD-10-CM | POA: Diagnosis not present

## 2024-01-30 DIAGNOSIS — N179 Acute kidney failure, unspecified: Secondary | ICD-10-CM | POA: Diagnosis not present

## 2024-01-30 DIAGNOSIS — R0781 Pleurodynia: Secondary | ICD-10-CM | POA: Diagnosis not present

## 2024-01-30 DIAGNOSIS — D7581 Myelofibrosis: Secondary | ICD-10-CM | POA: Diagnosis not present

## 2024-01-30 DIAGNOSIS — Z9484 Stem cells transplant status: Secondary | ICD-10-CM | POA: Diagnosis not present

## 2024-01-31 DIAGNOSIS — D471 Chronic myeloproliferative disease: Secondary | ICD-10-CM | POA: Diagnosis not present

## 2024-02-03 DIAGNOSIS — R627 Adult failure to thrive: Secondary | ICD-10-CM | POA: Diagnosis not present

## 2024-02-03 DIAGNOSIS — I4891 Unspecified atrial fibrillation: Secondary | ICD-10-CM | POA: Diagnosis not present

## 2024-02-03 DIAGNOSIS — N179 Acute kidney failure, unspecified: Secondary | ICD-10-CM | POA: Diagnosis not present

## 2024-02-03 DIAGNOSIS — E860 Dehydration: Secondary | ICD-10-CM | POA: Diagnosis not present

## 2024-02-03 DIAGNOSIS — D471 Chronic myeloproliferative disease: Secondary | ICD-10-CM | POA: Diagnosis not present

## 2024-02-03 DIAGNOSIS — E43 Unspecified severe protein-calorie malnutrition: Secondary | ICD-10-CM | POA: Diagnosis not present

## 2024-02-03 DIAGNOSIS — Z9481 Bone marrow transplant status: Secondary | ICD-10-CM | POA: Diagnosis not present

## 2024-02-03 DIAGNOSIS — Z681 Body mass index (BMI) 19 or less, adult: Secondary | ICD-10-CM | POA: Diagnosis not present

## 2024-02-03 DIAGNOSIS — Z9484 Stem cells transplant status: Secondary | ICD-10-CM | POA: Diagnosis not present

## 2024-02-03 DIAGNOSIS — D7581 Myelofibrosis: Secondary | ICD-10-CM | POA: Diagnosis not present

## 2024-02-03 DIAGNOSIS — M545 Low back pain, unspecified: Secondary | ICD-10-CM | POA: Diagnosis not present

## 2024-02-04 DIAGNOSIS — R627 Adult failure to thrive: Secondary | ICD-10-CM | POA: Diagnosis not present

## 2024-02-05 DIAGNOSIS — R627 Adult failure to thrive: Secondary | ICD-10-CM | POA: Diagnosis not present

## 2024-02-06 DIAGNOSIS — R627 Adult failure to thrive: Secondary | ICD-10-CM | POA: Diagnosis not present

## 2024-02-06 DIAGNOSIS — I4891 Unspecified atrial fibrillation: Secondary | ICD-10-CM | POA: Diagnosis not present

## 2024-02-07 DIAGNOSIS — Z9481 Bone marrow transplant status: Secondary | ICD-10-CM | POA: Diagnosis not present

## 2024-02-07 DIAGNOSIS — R627 Adult failure to thrive: Secondary | ICD-10-CM | POA: Diagnosis not present

## 2024-02-10 DIAGNOSIS — D7581 Myelofibrosis: Secondary | ICD-10-CM | POA: Diagnosis not present

## 2024-02-10 DIAGNOSIS — R627 Adult failure to thrive: Secondary | ICD-10-CM | POA: Diagnosis not present

## 2024-02-11 DIAGNOSIS — D471 Chronic myeloproliferative disease: Secondary | ICD-10-CM | POA: Diagnosis not present

## 2024-02-13 DIAGNOSIS — Z9481 Bone marrow transplant status: Secondary | ICD-10-CM | POA: Diagnosis not present

## 2024-02-13 DIAGNOSIS — R000 Tachycardia, unspecified: Secondary | ICD-10-CM | POA: Diagnosis not present

## 2024-02-13 DIAGNOSIS — G9341 Metabolic encephalopathy: Secondary | ICD-10-CM | POA: Diagnosis not present

## 2024-02-13 DIAGNOSIS — D849 Immunodeficiency, unspecified: Secondary | ICD-10-CM | POA: Diagnosis not present

## 2024-02-13 DIAGNOSIS — L02413 Cutaneous abscess of right upper limb: Secondary | ICD-10-CM | POA: Diagnosis not present

## 2024-02-13 DIAGNOSIS — J90 Pleural effusion, not elsewhere classified: Secondary | ICD-10-CM | POA: Diagnosis not present

## 2024-02-13 DIAGNOSIS — D45 Polycythemia vera: Secondary | ICD-10-CM | POA: Diagnosis not present

## 2024-02-13 DIAGNOSIS — T827XXA Infection and inflammatory reaction due to other cardiac and vascular devices, implants and grafts, initial encounter: Secondary | ICD-10-CM | POA: Diagnosis not present

## 2024-02-13 DIAGNOSIS — Z452 Encounter for adjustment and management of vascular access device: Secondary | ICD-10-CM | POA: Diagnosis not present

## 2024-02-13 DIAGNOSIS — I959 Hypotension, unspecified: Secondary | ICD-10-CM | POA: Diagnosis not present

## 2024-02-13 DIAGNOSIS — D471 Chronic myeloproliferative disease: Secondary | ICD-10-CM | POA: Diagnosis not present

## 2024-02-13 DIAGNOSIS — R627 Adult failure to thrive: Secondary | ICD-10-CM | POA: Diagnosis not present

## 2024-02-13 DIAGNOSIS — R6521 Severe sepsis with septic shock: Secondary | ICD-10-CM | POA: Diagnosis not present

## 2024-02-13 DIAGNOSIS — R509 Fever, unspecified: Secondary | ICD-10-CM | POA: Diagnosis not present

## 2024-02-13 DIAGNOSIS — G08 Intracranial and intraspinal phlebitis and thrombophlebitis: Secondary | ICD-10-CM | POA: Diagnosis not present

## 2024-02-13 DIAGNOSIS — B961 Klebsiella pneumoniae [K. pneumoniae] as the cause of diseases classified elsewhere: Secondary | ICD-10-CM | POA: Diagnosis not present

## 2024-02-13 DIAGNOSIS — R7881 Bacteremia: Secondary | ICD-10-CM | POA: Diagnosis not present

## 2024-02-13 DIAGNOSIS — A419 Sepsis, unspecified organism: Secondary | ICD-10-CM | POA: Diagnosis not present

## 2024-02-13 DIAGNOSIS — D7581 Myelofibrosis: Secondary | ICD-10-CM | POA: Diagnosis not present

## 2024-02-13 DIAGNOSIS — E43 Unspecified severe protein-calorie malnutrition: Secondary | ICD-10-CM | POA: Diagnosis not present

## 2024-02-13 DIAGNOSIS — R2681 Unsteadiness on feet: Secondary | ICD-10-CM | POA: Diagnosis not present

## 2024-02-13 DIAGNOSIS — A4159 Other Gram-negative sepsis: Secondary | ICD-10-CM | POA: Diagnosis not present

## 2024-02-13 DIAGNOSIS — N179 Acute kidney failure, unspecified: Secondary | ICD-10-CM | POA: Diagnosis not present

## 2024-02-13 DIAGNOSIS — D696 Thrombocytopenia, unspecified: Secondary | ICD-10-CM | POA: Diagnosis not present

## 2024-02-13 DIAGNOSIS — R29898 Other symptoms and signs involving the musculoskeletal system: Secondary | ICD-10-CM | POA: Diagnosis not present

## 2024-02-17 DIAGNOSIS — B961 Klebsiella pneumoniae [K. pneumoniae] as the cause of diseases classified elsewhere: Secondary | ICD-10-CM | POA: Diagnosis not present

## 2024-02-17 DIAGNOSIS — R7881 Bacteremia: Secondary | ICD-10-CM | POA: Diagnosis not present

## 2024-02-20 DIAGNOSIS — Z9484 Stem cells transplant status: Secondary | ICD-10-CM | POA: Diagnosis not present

## 2024-02-20 DIAGNOSIS — D45 Polycythemia vera: Secondary | ICD-10-CM | POA: Diagnosis not present

## 2024-02-20 DIAGNOSIS — R627 Adult failure to thrive: Secondary | ICD-10-CM | POA: Diagnosis not present

## 2024-02-20 DIAGNOSIS — I4891 Unspecified atrial fibrillation: Secondary | ICD-10-CM | POA: Diagnosis not present

## 2024-02-20 DIAGNOSIS — Z5189 Encounter for other specified aftercare: Secondary | ICD-10-CM | POA: Diagnosis not present

## 2024-02-20 DIAGNOSIS — B961 Klebsiella pneumoniae [K. pneumoniae] as the cause of diseases classified elsewhere: Secondary | ICD-10-CM | POA: Diagnosis not present

## 2024-02-20 DIAGNOSIS — R410 Disorientation, unspecified: Secondary | ICD-10-CM | POA: Diagnosis not present

## 2024-02-20 DIAGNOSIS — R4182 Altered mental status, unspecified: Secondary | ICD-10-CM | POA: Diagnosis not present

## 2024-02-20 DIAGNOSIS — D696 Thrombocytopenia, unspecified: Secondary | ICD-10-CM | POA: Diagnosis not present

## 2024-02-20 DIAGNOSIS — A4159 Other Gram-negative sepsis: Secondary | ICD-10-CM | POA: Diagnosis not present

## 2024-02-20 DIAGNOSIS — D89813 Graft-versus-host disease, unspecified: Secondary | ICD-10-CM | POA: Diagnosis not present

## 2024-02-20 DIAGNOSIS — R918 Other nonspecific abnormal finding of lung field: Secondary | ICD-10-CM | POA: Diagnosis not present

## 2024-02-20 DIAGNOSIS — R29898 Other symptoms and signs involving the musculoskeletal system: Secondary | ICD-10-CM | POA: Diagnosis not present

## 2024-02-20 DIAGNOSIS — R935 Abnormal findings on diagnostic imaging of other abdominal regions, including retroperitoneum: Secondary | ICD-10-CM | POA: Diagnosis not present

## 2024-02-20 DIAGNOSIS — D84821 Immunodeficiency due to drugs: Secondary | ICD-10-CM | POA: Diagnosis not present

## 2024-02-20 DIAGNOSIS — R2681 Unsteadiness on feet: Secondary | ICD-10-CM | POA: Diagnosis not present

## 2024-02-20 DIAGNOSIS — D61818 Other pancytopenia: Secondary | ICD-10-CM | POA: Diagnosis not present

## 2024-02-20 DIAGNOSIS — Z9481 Bone marrow transplant status: Secondary | ICD-10-CM | POA: Diagnosis not present

## 2024-02-20 DIAGNOSIS — D7581 Myelofibrosis: Secondary | ICD-10-CM | POA: Diagnosis not present

## 2024-02-20 DIAGNOSIS — D471 Chronic myeloproliferative disease: Secondary | ICD-10-CM | POA: Diagnosis not present

## 2024-02-21 DIAGNOSIS — R000 Tachycardia, unspecified: Secondary | ICD-10-CM | POA: Diagnosis not present

## 2024-02-21 DIAGNOSIS — Z9484 Stem cells transplant status: Secondary | ICD-10-CM | POA: Diagnosis not present

## 2024-02-21 DIAGNOSIS — R4182 Altered mental status, unspecified: Secondary | ICD-10-CM | POA: Diagnosis not present

## 2024-02-21 DIAGNOSIS — D471 Chronic myeloproliferative disease: Secondary | ICD-10-CM | POA: Diagnosis not present

## 2024-02-21 DIAGNOSIS — D84821 Immunodeficiency due to drugs: Secondary | ICD-10-CM | POA: Diagnosis not present

## 2024-02-21 DIAGNOSIS — R935 Abnormal findings on diagnostic imaging of other abdominal regions, including retroperitoneum: Secondary | ICD-10-CM | POA: Diagnosis not present

## 2024-02-21 DIAGNOSIS — Z5189 Encounter for other specified aftercare: Secondary | ICD-10-CM | POA: Diagnosis not present

## 2024-02-21 DIAGNOSIS — A4159 Other Gram-negative sepsis: Secondary | ICD-10-CM | POA: Diagnosis not present

## 2024-02-21 DIAGNOSIS — D89813 Graft-versus-host disease, unspecified: Secondary | ICD-10-CM | POA: Diagnosis not present

## 2024-02-21 DIAGNOSIS — R410 Disorientation, unspecified: Secondary | ICD-10-CM | POA: Diagnosis not present

## 2024-02-21 DIAGNOSIS — R918 Other nonspecific abnormal finding of lung field: Secondary | ICD-10-CM | POA: Diagnosis not present

## 2024-02-24 DIAGNOSIS — D45 Polycythemia vera: Secondary | ICD-10-CM | POA: Diagnosis not present

## 2024-02-24 DIAGNOSIS — D471 Chronic myeloproliferative disease: Secondary | ICD-10-CM | POA: Diagnosis not present

## 2024-02-24 DIAGNOSIS — R627 Adult failure to thrive: Secondary | ICD-10-CM | POA: Diagnosis not present

## 2024-02-24 DIAGNOSIS — D7581 Myelofibrosis: Secondary | ICD-10-CM | POA: Diagnosis not present

## 2024-02-24 DIAGNOSIS — D5911 Warm autoimmune hemolytic anemia: Secondary | ICD-10-CM | POA: Diagnosis not present

## 2024-02-24 DIAGNOSIS — Z9481 Bone marrow transplant status: Secondary | ICD-10-CM | POA: Diagnosis not present

## 2024-02-24 DIAGNOSIS — N400 Enlarged prostate without lower urinary tract symptoms: Secondary | ICD-10-CM | POA: Diagnosis not present

## 2024-02-27 DIAGNOSIS — K862 Cyst of pancreas: Secondary | ICD-10-CM | POA: Diagnosis not present

## 2024-02-27 DIAGNOSIS — Z9481 Bone marrow transplant status: Secondary | ICD-10-CM | POA: Diagnosis not present

## 2024-02-27 DIAGNOSIS — D589 Hereditary hemolytic anemia, unspecified: Secondary | ICD-10-CM | POA: Diagnosis not present

## 2024-02-27 DIAGNOSIS — D471 Chronic myeloproliferative disease: Secondary | ICD-10-CM | POA: Diagnosis not present

## 2024-02-27 DIAGNOSIS — M11251 Other chondrocalcinosis, right hip: Secondary | ICD-10-CM | POA: Diagnosis not present

## 2024-02-27 DIAGNOSIS — M25551 Pain in right hip: Secondary | ICD-10-CM | POA: Diagnosis not present

## 2024-02-27 DIAGNOSIS — Z7901 Long term (current) use of anticoagulants: Secondary | ICD-10-CM | POA: Diagnosis not present

## 2024-02-27 DIAGNOSIS — Z9484 Stem cells transplant status: Secondary | ICD-10-CM | POA: Diagnosis not present

## 2024-02-27 DIAGNOSIS — D45 Polycythemia vera: Secondary | ICD-10-CM | POA: Diagnosis not present

## 2024-03-02 DIAGNOSIS — R4182 Altered mental status, unspecified: Secondary | ICD-10-CM | POA: Diagnosis not present

## 2024-03-02 DIAGNOSIS — J9601 Acute respiratory failure with hypoxia: Secondary | ICD-10-CM | POA: Diagnosis not present

## 2024-03-02 DIAGNOSIS — D509 Iron deficiency anemia, unspecified: Secondary | ICD-10-CM | POA: Diagnosis not present

## 2024-03-02 DIAGNOSIS — A4159 Other Gram-negative sepsis: Secondary | ICD-10-CM | POA: Diagnosis not present

## 2024-03-02 DIAGNOSIS — E46 Unspecified protein-calorie malnutrition: Secondary | ICD-10-CM | POA: Diagnosis not present

## 2024-03-02 DIAGNOSIS — D849 Immunodeficiency, unspecified: Secondary | ICD-10-CM | POA: Diagnosis not present

## 2024-03-02 DIAGNOSIS — G9341 Metabolic encephalopathy: Secondary | ICD-10-CM | POA: Diagnosis not present

## 2024-03-02 DIAGNOSIS — I4891 Unspecified atrial fibrillation: Secondary | ICD-10-CM | POA: Diagnosis not present

## 2024-03-02 DIAGNOSIS — R918 Other nonspecific abnormal finding of lung field: Secondary | ICD-10-CM | POA: Diagnosis not present

## 2024-03-02 DIAGNOSIS — Z9484 Stem cells transplant status: Secondary | ICD-10-CM | POA: Diagnosis not present

## 2024-03-02 DIAGNOSIS — E785 Hyperlipidemia, unspecified: Secondary | ICD-10-CM | POA: Diagnosis not present

## 2024-03-02 DIAGNOSIS — I5020 Unspecified systolic (congestive) heart failure: Secondary | ICD-10-CM | POA: Diagnosis not present

## 2024-03-02 DIAGNOSIS — I4892 Unspecified atrial flutter: Secondary | ICD-10-CM | POA: Diagnosis not present

## 2024-03-02 DIAGNOSIS — R000 Tachycardia, unspecified: Secondary | ICD-10-CM | POA: Diagnosis not present

## 2024-03-02 DIAGNOSIS — D696 Thrombocytopenia, unspecified: Secondary | ICD-10-CM | POA: Diagnosis not present

## 2024-03-02 DIAGNOSIS — K521 Toxic gastroenteritis and colitis: Secondary | ICD-10-CM | POA: Diagnosis not present

## 2024-03-02 DIAGNOSIS — I499 Cardiac arrhythmia, unspecified: Secondary | ICD-10-CM | POA: Diagnosis not present

## 2024-03-02 DIAGNOSIS — J189 Pneumonia, unspecified organism: Secondary | ICD-10-CM | POA: Diagnosis not present

## 2024-03-02 DIAGNOSIS — R079 Chest pain, unspecified: Secondary | ICD-10-CM | POA: Diagnosis not present

## 2024-03-02 DIAGNOSIS — Z9481 Bone marrow transplant status: Secondary | ICD-10-CM | POA: Diagnosis not present

## 2024-03-02 DIAGNOSIS — G4700 Insomnia, unspecified: Secondary | ICD-10-CM | POA: Diagnosis not present

## 2024-03-02 DIAGNOSIS — D7581 Myelofibrosis: Secondary | ICD-10-CM | POA: Diagnosis not present

## 2024-03-02 DIAGNOSIS — A419 Sepsis, unspecified organism: Secondary | ICD-10-CM | POA: Diagnosis not present

## 2024-03-02 DIAGNOSIS — D45 Polycythemia vera: Secondary | ICD-10-CM | POA: Diagnosis not present

## 2024-03-02 DIAGNOSIS — E860 Dehydration: Secondary | ICD-10-CM | POA: Diagnosis not present

## 2024-03-02 DIAGNOSIS — J90 Pleural effusion, not elsewhere classified: Secondary | ICD-10-CM | POA: Diagnosis not present

## 2024-03-02 DIAGNOSIS — D471 Chronic myeloproliferative disease: Secondary | ICD-10-CM | POA: Diagnosis not present

## 2024-03-02 DIAGNOSIS — Z431 Encounter for attention to gastrostomy: Secondary | ICD-10-CM | POA: Diagnosis not present

## 2024-03-02 DIAGNOSIS — I999 Unspecified disorder of circulatory system: Secondary | ICD-10-CM | POA: Diagnosis not present

## 2024-03-02 DIAGNOSIS — R768 Other specified abnormal immunological findings in serum: Secondary | ICD-10-CM | POA: Diagnosis not present

## 2024-03-02 DIAGNOSIS — R6521 Severe sepsis with septic shock: Secondary | ICD-10-CM | POA: Diagnosis not present

## 2024-03-02 DIAGNOSIS — J150 Pneumonia due to Klebsiella pneumoniae: Secondary | ICD-10-CM | POA: Diagnosis not present

## 2024-03-02 DIAGNOSIS — T50995A Adverse effect of other drugs, medicaments and biological substances, initial encounter: Secondary | ICD-10-CM | POA: Diagnosis not present

## 2024-03-02 DIAGNOSIS — R627 Adult failure to thrive: Secondary | ICD-10-CM | POA: Diagnosis not present

## 2024-03-02 DIAGNOSIS — E43 Unspecified severe protein-calorie malnutrition: Secondary | ICD-10-CM | POA: Diagnosis not present

## 2024-03-02 DIAGNOSIS — I491 Atrial premature depolarization: Secondary | ICD-10-CM | POA: Diagnosis not present

## 2024-03-02 DIAGNOSIS — R29898 Other symptoms and signs involving the musculoskeletal system: Secondary | ICD-10-CM | POA: Diagnosis not present

## 2024-03-02 DIAGNOSIS — R2681 Unsteadiness on feet: Secondary | ICD-10-CM | POA: Diagnosis not present

## 2024-03-02 DIAGNOSIS — E872 Acidosis, unspecified: Secondary | ICD-10-CM | POA: Diagnosis not present

## 2024-03-02 DIAGNOSIS — Z792 Long term (current) use of antibiotics: Secondary | ICD-10-CM | POA: Diagnosis not present

## 2024-03-02 DIAGNOSIS — R1031 Right lower quadrant pain: Secondary | ICD-10-CM | POA: Diagnosis not present

## 2024-03-03 DIAGNOSIS — E43 Unspecified severe protein-calorie malnutrition: Secondary | ICD-10-CM | POA: Diagnosis not present

## 2024-03-03 DIAGNOSIS — D7581 Myelofibrosis: Secondary | ICD-10-CM | POA: Diagnosis not present

## 2024-03-03 DIAGNOSIS — R4182 Altered mental status, unspecified: Secondary | ICD-10-CM | POA: Diagnosis not present

## 2024-03-05 DIAGNOSIS — D45 Polycythemia vera: Secondary | ICD-10-CM | POA: Diagnosis not present

## 2024-03-05 DIAGNOSIS — J90 Pleural effusion, not elsewhere classified: Secondary | ICD-10-CM | POA: Diagnosis not present

## 2024-03-05 DIAGNOSIS — A419 Sepsis, unspecified organism: Secondary | ICD-10-CM | POA: Diagnosis not present

## 2024-03-05 DIAGNOSIS — R6521 Severe sepsis with septic shock: Secondary | ICD-10-CM | POA: Diagnosis not present

## 2024-03-05 DIAGNOSIS — D7581 Myelofibrosis: Secondary | ICD-10-CM | POA: Diagnosis not present

## 2024-03-05 DIAGNOSIS — R4182 Altered mental status, unspecified: Secondary | ICD-10-CM | POA: Diagnosis not present

## 2024-03-05 DIAGNOSIS — Z9481 Bone marrow transplant status: Secondary | ICD-10-CM | POA: Diagnosis not present

## 2024-03-06 DIAGNOSIS — R6521 Severe sepsis with septic shock: Secondary | ICD-10-CM | POA: Diagnosis not present

## 2024-03-06 DIAGNOSIS — I5020 Unspecified systolic (congestive) heart failure: Secondary | ICD-10-CM | POA: Diagnosis not present

## 2024-03-06 DIAGNOSIS — I491 Atrial premature depolarization: Secondary | ICD-10-CM | POA: Diagnosis not present

## 2024-03-06 DIAGNOSIS — A419 Sepsis, unspecified organism: Secondary | ICD-10-CM | POA: Diagnosis not present

## 2024-03-06 DIAGNOSIS — R918 Other nonspecific abnormal finding of lung field: Secondary | ICD-10-CM | POA: Diagnosis not present

## 2024-03-07 DIAGNOSIS — R6521 Severe sepsis with septic shock: Secondary | ICD-10-CM | POA: Diagnosis not present

## 2024-03-07 DIAGNOSIS — A419 Sepsis, unspecified organism: Secondary | ICD-10-CM | POA: Diagnosis not present

## 2024-03-08 DIAGNOSIS — R6521 Severe sepsis with septic shock: Secondary | ICD-10-CM | POA: Diagnosis not present

## 2024-03-08 DIAGNOSIS — A419 Sepsis, unspecified organism: Secondary | ICD-10-CM | POA: Diagnosis not present

## 2024-03-08 DIAGNOSIS — R000 Tachycardia, unspecified: Secondary | ICD-10-CM | POA: Diagnosis not present

## 2024-03-08 DIAGNOSIS — R079 Chest pain, unspecified: Secondary | ICD-10-CM | POA: Diagnosis not present

## 2024-03-09 DIAGNOSIS — R6521 Severe sepsis with septic shock: Secondary | ICD-10-CM | POA: Diagnosis not present

## 2024-03-09 DIAGNOSIS — I4892 Unspecified atrial flutter: Secondary | ICD-10-CM | POA: Diagnosis not present

## 2024-03-09 DIAGNOSIS — A419 Sepsis, unspecified organism: Secondary | ICD-10-CM | POA: Diagnosis not present

## 2024-03-09 DIAGNOSIS — I499 Cardiac arrhythmia, unspecified: Secondary | ICD-10-CM | POA: Diagnosis not present

## 2024-03-10 DIAGNOSIS — A419 Sepsis, unspecified organism: Secondary | ICD-10-CM | POA: Diagnosis not present

## 2024-03-10 DIAGNOSIS — R1031 Right lower quadrant pain: Secondary | ICD-10-CM | POA: Diagnosis not present

## 2024-03-10 DIAGNOSIS — R6521 Severe sepsis with septic shock: Secondary | ICD-10-CM | POA: Diagnosis not present

## 2024-03-11 DIAGNOSIS — J90 Pleural effusion, not elsewhere classified: Secondary | ICD-10-CM | POA: Diagnosis not present

## 2024-03-11 DIAGNOSIS — R768 Other specified abnormal immunological findings in serum: Secondary | ICD-10-CM | POA: Diagnosis not present

## 2024-03-11 DIAGNOSIS — A419 Sepsis, unspecified organism: Secondary | ICD-10-CM | POA: Diagnosis not present

## 2024-03-11 DIAGNOSIS — R6521 Severe sepsis with septic shock: Secondary | ICD-10-CM | POA: Diagnosis not present

## 2024-03-12 DIAGNOSIS — I4892 Unspecified atrial flutter: Secondary | ICD-10-CM | POA: Diagnosis not present

## 2024-03-12 DIAGNOSIS — A419 Sepsis, unspecified organism: Secondary | ICD-10-CM | POA: Diagnosis not present

## 2024-03-12 DIAGNOSIS — R6521 Severe sepsis with septic shock: Secondary | ICD-10-CM | POA: Diagnosis not present

## 2024-03-12 DIAGNOSIS — R000 Tachycardia, unspecified: Secondary | ICD-10-CM | POA: Diagnosis not present

## 2024-03-13 DIAGNOSIS — D696 Thrombocytopenia, unspecified: Secondary | ICD-10-CM | POA: Diagnosis not present

## 2024-03-13 DIAGNOSIS — E785 Hyperlipidemia, unspecified: Secondary | ICD-10-CM | POA: Diagnosis not present

## 2024-03-13 DIAGNOSIS — D509 Iron deficiency anemia, unspecified: Secondary | ICD-10-CM | POA: Diagnosis not present

## 2024-03-13 DIAGNOSIS — D45 Polycythemia vera: Secondary | ICD-10-CM | POA: Diagnosis not present

## 2024-03-13 DIAGNOSIS — A419 Sepsis, unspecified organism: Secondary | ICD-10-CM | POA: Diagnosis not present

## 2024-03-13 DIAGNOSIS — E46 Unspecified protein-calorie malnutrition: Secondary | ICD-10-CM | POA: Diagnosis not present

## 2024-03-13 DIAGNOSIS — J9811 Atelectasis: Secondary | ICD-10-CM | POA: Diagnosis not present

## 2024-03-13 DIAGNOSIS — Z431 Encounter for attention to gastrostomy: Secondary | ICD-10-CM | POA: Diagnosis not present

## 2024-03-13 DIAGNOSIS — D72829 Elevated white blood cell count, unspecified: Secondary | ICD-10-CM | POA: Diagnosis not present

## 2024-03-13 DIAGNOSIS — I4891 Unspecified atrial fibrillation: Secondary | ICD-10-CM | POA: Diagnosis not present

## 2024-03-13 DIAGNOSIS — Z9481 Bone marrow transplant status: Secondary | ICD-10-CM | POA: Diagnosis not present

## 2024-03-13 DIAGNOSIS — Z792 Long term (current) use of antibiotics: Secondary | ICD-10-CM | POA: Diagnosis not present

## 2024-03-13 DIAGNOSIS — R6521 Severe sepsis with septic shock: Secondary | ICD-10-CM | POA: Diagnosis not present

## 2024-03-13 DIAGNOSIS — D7581 Myelofibrosis: Secondary | ICD-10-CM | POA: Diagnosis not present

## 2024-03-16 DIAGNOSIS — Z9481 Bone marrow transplant status: Secondary | ICD-10-CM | POA: Diagnosis not present

## 2024-03-17 DIAGNOSIS — D72829 Elevated white blood cell count, unspecified: Secondary | ICD-10-CM | POA: Diagnosis not present

## 2024-03-17 DIAGNOSIS — D7581 Myelofibrosis: Secondary | ICD-10-CM | POA: Diagnosis not present

## 2024-03-17 DIAGNOSIS — D45 Polycythemia vera: Secondary | ICD-10-CM | POA: Diagnosis not present

## 2024-03-17 DIAGNOSIS — G08 Intracranial and intraspinal phlebitis and thrombophlebitis: Secondary | ICD-10-CM | POA: Diagnosis not present

## 2024-03-18 DIAGNOSIS — D72829 Elevated white blood cell count, unspecified: Secondary | ICD-10-CM | POA: Diagnosis not present

## 2024-03-18 DIAGNOSIS — Z9481 Bone marrow transplant status: Secondary | ICD-10-CM | POA: Diagnosis not present

## 2024-03-18 DIAGNOSIS — Z9484 Stem cells transplant status: Secondary | ICD-10-CM | POA: Diagnosis not present

## 2024-03-18 DIAGNOSIS — D471 Chronic myeloproliferative disease: Secondary | ICD-10-CM | POA: Diagnosis not present

## 2024-03-18 DIAGNOSIS — E871 Hypo-osmolality and hyponatremia: Secondary | ICD-10-CM | POA: Diagnosis not present

## 2024-03-18 DIAGNOSIS — M25571 Pain in right ankle and joints of right foot: Secondary | ICD-10-CM | POA: Diagnosis not present

## 2024-03-18 DIAGNOSIS — J189 Pneumonia, unspecified organism: Secondary | ICD-10-CM | POA: Diagnosis not present

## 2024-03-19 DIAGNOSIS — E871 Hypo-osmolality and hyponatremia: Secondary | ICD-10-CM | POA: Diagnosis not present

## 2024-03-19 DIAGNOSIS — D72829 Elevated white blood cell count, unspecified: Secondary | ICD-10-CM | POA: Diagnosis not present

## 2024-03-19 DIAGNOSIS — Z9484 Stem cells transplant status: Secondary | ICD-10-CM | POA: Diagnosis not present

## 2024-03-19 DIAGNOSIS — D471 Chronic myeloproliferative disease: Secondary | ICD-10-CM | POA: Diagnosis not present

## 2024-03-19 DIAGNOSIS — M25571 Pain in right ankle and joints of right foot: Secondary | ICD-10-CM | POA: Diagnosis not present

## 2024-03-20 DIAGNOSIS — E871 Hypo-osmolality and hyponatremia: Secondary | ICD-10-CM | POA: Diagnosis not present

## 2024-03-20 DIAGNOSIS — M25471 Effusion, right ankle: Secondary | ICD-10-CM | POA: Diagnosis not present

## 2024-03-20 DIAGNOSIS — R079 Chest pain, unspecified: Secondary | ICD-10-CM | POA: Diagnosis not present

## 2024-03-20 DIAGNOSIS — D708 Other neutropenia: Secondary | ICD-10-CM | POA: Diagnosis not present

## 2024-03-20 DIAGNOSIS — Z9484 Stem cells transplant status: Secondary | ICD-10-CM | POA: Diagnosis not present

## 2024-03-20 DIAGNOSIS — S86311A Strain of muscle(s) and tendon(s) of peroneal muscle group at lower leg level, right leg, initial encounter: Secondary | ICD-10-CM | POA: Diagnosis not present

## 2024-03-20 DIAGNOSIS — D471 Chronic myeloproliferative disease: Secondary | ICD-10-CM | POA: Diagnosis not present

## 2024-03-20 DIAGNOSIS — D72829 Elevated white blood cell count, unspecified: Secondary | ICD-10-CM | POA: Diagnosis not present

## 2024-03-20 DIAGNOSIS — M25571 Pain in right ankle and joints of right foot: Secondary | ICD-10-CM | POA: Diagnosis not present

## 2024-03-20 DIAGNOSIS — M65861 Other synovitis and tenosynovitis, right lower leg: Secondary | ICD-10-CM | POA: Diagnosis not present

## 2024-03-21 DIAGNOSIS — D72829 Elevated white blood cell count, unspecified: Secondary | ICD-10-CM | POA: Diagnosis not present

## 2024-03-21 DIAGNOSIS — M25571 Pain in right ankle and joints of right foot: Secondary | ICD-10-CM | POA: Diagnosis not present

## 2024-03-21 DIAGNOSIS — Z9484 Stem cells transplant status: Secondary | ICD-10-CM | POA: Diagnosis not present

## 2024-03-21 DIAGNOSIS — D471 Chronic myeloproliferative disease: Secondary | ICD-10-CM | POA: Diagnosis not present

## 2024-03-21 DIAGNOSIS — E871 Hypo-osmolality and hyponatremia: Secondary | ICD-10-CM | POA: Diagnosis not present

## 2024-03-22 DIAGNOSIS — D72829 Elevated white blood cell count, unspecified: Secondary | ICD-10-CM | POA: Diagnosis not present

## 2024-03-23 DIAGNOSIS — D72829 Elevated white blood cell count, unspecified: Secondary | ICD-10-CM | POA: Diagnosis not present

## 2024-03-23 DIAGNOSIS — R918 Other nonspecific abnormal finding of lung field: Secondary | ICD-10-CM | POA: Diagnosis not present

## 2024-03-24 DIAGNOSIS — Z09 Encounter for follow-up examination after completed treatment for conditions other than malignant neoplasm: Secondary | ICD-10-CM | POA: Diagnosis not present

## 2024-03-24 DIAGNOSIS — E860 Dehydration: Secondary | ICD-10-CM | POA: Diagnosis not present

## 2024-03-24 DIAGNOSIS — I4891 Unspecified atrial fibrillation: Secondary | ICD-10-CM | POA: Diagnosis not present

## 2024-03-24 DIAGNOSIS — D649 Anemia, unspecified: Secondary | ICD-10-CM | POA: Diagnosis not present

## 2024-03-24 DIAGNOSIS — D696 Thrombocytopenia, unspecified: Secondary | ICD-10-CM | POA: Diagnosis not present

## 2024-03-24 DIAGNOSIS — I2510 Atherosclerotic heart disease of native coronary artery without angina pectoris: Secondary | ICD-10-CM | POA: Diagnosis not present

## 2024-03-24 DIAGNOSIS — D7581 Myelofibrosis: Secondary | ICD-10-CM | POA: Diagnosis not present

## 2024-03-24 DIAGNOSIS — R64 Cachexia: Secondary | ICD-10-CM | POA: Diagnosis not present

## 2024-03-24 DIAGNOSIS — E871 Hypo-osmolality and hyponatremia: Secondary | ICD-10-CM | POA: Diagnosis not present

## 2024-03-24 DIAGNOSIS — D72829 Elevated white blood cell count, unspecified: Secondary | ICD-10-CM | POA: Diagnosis not present

## 2024-03-24 DIAGNOSIS — E46 Unspecified protein-calorie malnutrition: Secondary | ICD-10-CM | POA: Diagnosis not present

## 2024-03-24 DIAGNOSIS — K219 Gastro-esophageal reflux disease without esophagitis: Secondary | ICD-10-CM | POA: Diagnosis not present

## 2024-03-24 DIAGNOSIS — E785 Hyperlipidemia, unspecified: Secondary | ICD-10-CM | POA: Diagnosis not present

## 2024-03-24 DIAGNOSIS — G9341 Metabolic encephalopathy: Secondary | ICD-10-CM | POA: Diagnosis not present

## 2024-03-24 DIAGNOSIS — G894 Chronic pain syndrome: Secondary | ICD-10-CM | POA: Diagnosis not present

## 2024-03-24 DIAGNOSIS — M6281 Muscle weakness (generalized): Secondary | ICD-10-CM | POA: Diagnosis not present

## 2024-03-24 DIAGNOSIS — N179 Acute kidney failure, unspecified: Secondary | ICD-10-CM | POA: Diagnosis not present

## 2024-03-24 DIAGNOSIS — Z9484 Stem cells transplant status: Secondary | ICD-10-CM | POA: Diagnosis not present

## 2024-03-24 DIAGNOSIS — I959 Hypotension, unspecified: Secondary | ICD-10-CM | POA: Diagnosis not present

## 2024-03-24 DIAGNOSIS — D471 Chronic myeloproliferative disease: Secondary | ICD-10-CM | POA: Diagnosis not present

## 2024-03-24 DIAGNOSIS — D6959 Other secondary thrombocytopenia: Secondary | ICD-10-CM | POA: Diagnosis not present

## 2024-03-24 DIAGNOSIS — R627 Adult failure to thrive: Secondary | ICD-10-CM | POA: Diagnosis not present

## 2024-03-24 DIAGNOSIS — I480 Paroxysmal atrial fibrillation: Secondary | ICD-10-CM | POA: Diagnosis not present

## 2024-03-24 DIAGNOSIS — D45 Polycythemia vera: Secondary | ICD-10-CM | POA: Diagnosis not present

## 2024-03-24 DIAGNOSIS — R5381 Other malaise: Secondary | ICD-10-CM | POA: Diagnosis not present

## 2024-03-24 DIAGNOSIS — J189 Pneumonia, unspecified organism: Secondary | ICD-10-CM | POA: Diagnosis not present

## 2024-03-26 DIAGNOSIS — R531 Weakness: Secondary | ICD-10-CM | POA: Diagnosis not present

## 2024-03-26 DIAGNOSIS — D471 Chronic myeloproliferative disease: Secondary | ICD-10-CM | POA: Diagnosis not present

## 2024-03-26 DIAGNOSIS — E861 Hypovolemia: Secondary | ICD-10-CM | POA: Diagnosis not present

## 2024-03-26 DIAGNOSIS — E46 Unspecified protein-calorie malnutrition: Secondary | ICD-10-CM | POA: Diagnosis not present

## 2024-03-26 DIAGNOSIS — D6959 Other secondary thrombocytopenia: Secondary | ICD-10-CM | POA: Diagnosis not present

## 2024-03-26 DIAGNOSIS — I480 Paroxysmal atrial fibrillation: Secondary | ICD-10-CM | POA: Diagnosis not present

## 2024-03-26 DIAGNOSIS — Z09 Encounter for follow-up examination after completed treatment for conditions other than malignant neoplasm: Secondary | ICD-10-CM | POA: Diagnosis not present

## 2024-03-26 DIAGNOSIS — R64 Cachexia: Secondary | ICD-10-CM | POA: Diagnosis not present

## 2024-03-26 DIAGNOSIS — G894 Chronic pain syndrome: Secondary | ICD-10-CM | POA: Diagnosis not present

## 2024-03-26 DIAGNOSIS — I2510 Atherosclerotic heart disease of native coronary artery without angina pectoris: Secondary | ICD-10-CM | POA: Diagnosis not present

## 2024-03-26 DIAGNOSIS — E871 Hypo-osmolality and hyponatremia: Secondary | ICD-10-CM | POA: Diagnosis not present

## 2024-03-26 DIAGNOSIS — I4891 Unspecified atrial fibrillation: Secondary | ICD-10-CM | POA: Diagnosis not present

## 2024-03-26 DIAGNOSIS — R627 Adult failure to thrive: Secondary | ICD-10-CM | POA: Diagnosis not present

## 2024-03-26 DIAGNOSIS — D45 Polycythemia vera: Secondary | ICD-10-CM | POA: Diagnosis not present

## 2024-03-26 DIAGNOSIS — I959 Hypotension, unspecified: Secondary | ICD-10-CM | POA: Diagnosis not present

## 2024-03-26 DIAGNOSIS — N179 Acute kidney failure, unspecified: Secondary | ICD-10-CM | POA: Diagnosis not present

## 2024-03-26 DIAGNOSIS — Z743 Need for continuous supervision: Secondary | ICD-10-CM | POA: Diagnosis not present

## 2024-03-26 DIAGNOSIS — E860 Dehydration: Secondary | ICD-10-CM | POA: Diagnosis not present

## 2024-03-26 DIAGNOSIS — Z9484 Stem cells transplant status: Secondary | ICD-10-CM | POA: Diagnosis not present

## 2024-03-26 DIAGNOSIS — G9341 Metabolic encephalopathy: Secondary | ICD-10-CM | POA: Diagnosis not present

## 2024-03-26 DIAGNOSIS — D72829 Elevated white blood cell count, unspecified: Secondary | ICD-10-CM | POA: Diagnosis not present

## 2024-03-26 DIAGNOSIS — R5381 Other malaise: Secondary | ICD-10-CM | POA: Diagnosis not present

## 2024-03-26 DIAGNOSIS — D7581 Myelofibrosis: Secondary | ICD-10-CM | POA: Diagnosis not present

## 2024-03-26 DIAGNOSIS — K219 Gastro-esophageal reflux disease without esophagitis: Secondary | ICD-10-CM | POA: Diagnosis not present

## 2024-03-26 DIAGNOSIS — D649 Anemia, unspecified: Secondary | ICD-10-CM | POA: Diagnosis not present

## 2024-03-26 DIAGNOSIS — D696 Thrombocytopenia, unspecified: Secondary | ICD-10-CM | POA: Diagnosis not present

## 2024-03-27 DIAGNOSIS — E871 Hypo-osmolality and hyponatremia: Secondary | ICD-10-CM | POA: Diagnosis not present

## 2024-03-27 DIAGNOSIS — D649 Anemia, unspecified: Secondary | ICD-10-CM | POA: Diagnosis not present

## 2024-03-27 DIAGNOSIS — N179 Acute kidney failure, unspecified: Secondary | ICD-10-CM | POA: Diagnosis not present

## 2024-03-27 DIAGNOSIS — I959 Hypotension, unspecified: Secondary | ICD-10-CM | POA: Diagnosis not present

## 2024-03-27 DIAGNOSIS — D696 Thrombocytopenia, unspecified: Secondary | ICD-10-CM | POA: Diagnosis not present

## 2024-03-28 DIAGNOSIS — D649 Anemia, unspecified: Secondary | ICD-10-CM | POA: Diagnosis not present

## 2024-03-28 DIAGNOSIS — E871 Hypo-osmolality and hyponatremia: Secondary | ICD-10-CM | POA: Diagnosis not present

## 2024-03-28 DIAGNOSIS — I959 Hypotension, unspecified: Secondary | ICD-10-CM | POA: Diagnosis not present

## 2024-03-28 DIAGNOSIS — N179 Acute kidney failure, unspecified: Secondary | ICD-10-CM | POA: Diagnosis not present

## 2024-03-28 DIAGNOSIS — D696 Thrombocytopenia, unspecified: Secondary | ICD-10-CM | POA: Diagnosis not present

## 2024-03-29 DIAGNOSIS — E861 Hypovolemia: Secondary | ICD-10-CM | POA: Diagnosis not present

## 2024-03-30 DIAGNOSIS — R059 Cough, unspecified: Secondary | ICD-10-CM | POA: Diagnosis not present

## 2024-03-30 DIAGNOSIS — R531 Weakness: Secondary | ICD-10-CM | POA: Diagnosis not present

## 2024-03-30 DIAGNOSIS — E871 Hypo-osmolality and hyponatremia: Secondary | ICD-10-CM | POA: Diagnosis not present

## 2024-03-30 DIAGNOSIS — D696 Thrombocytopenia, unspecified: Secondary | ICD-10-CM | POA: Diagnosis not present

## 2024-03-30 DIAGNOSIS — D6959 Other secondary thrombocytopenia: Secondary | ICD-10-CM | POA: Diagnosis not present

## 2024-03-30 DIAGNOSIS — E876 Hypokalemia: Secondary | ICD-10-CM | POA: Diagnosis not present

## 2024-03-30 DIAGNOSIS — Z743 Need for continuous supervision: Secondary | ICD-10-CM | POA: Diagnosis not present

## 2024-03-30 DIAGNOSIS — D72829 Elevated white blood cell count, unspecified: Secondary | ICD-10-CM | POA: Diagnosis not present

## 2024-03-30 DIAGNOSIS — R197 Diarrhea, unspecified: Secondary | ICD-10-CM | POA: Diagnosis not present

## 2024-03-30 DIAGNOSIS — D649 Anemia, unspecified: Secondary | ICD-10-CM | POA: Diagnosis not present

## 2024-03-30 DIAGNOSIS — E46 Unspecified protein-calorie malnutrition: Secondary | ICD-10-CM | POA: Diagnosis not present

## 2024-03-30 DIAGNOSIS — K219 Gastro-esophageal reflux disease without esophagitis: Secondary | ICD-10-CM | POA: Diagnosis not present

## 2024-03-30 DIAGNOSIS — D471 Chronic myeloproliferative disease: Secondary | ICD-10-CM | POA: Diagnosis not present

## 2024-03-30 DIAGNOSIS — I4891 Unspecified atrial fibrillation: Secondary | ICD-10-CM | POA: Diagnosis not present

## 2024-03-30 DIAGNOSIS — D6481 Anemia due to antineoplastic chemotherapy: Secondary | ICD-10-CM | POA: Diagnosis not present

## 2024-03-30 DIAGNOSIS — I82220 Acute embolism and thrombosis of inferior vena cava: Secondary | ICD-10-CM | POA: Diagnosis not present

## 2024-03-30 DIAGNOSIS — Z681 Body mass index (BMI) 19 or less, adult: Secondary | ICD-10-CM | POA: Diagnosis not present

## 2024-03-30 DIAGNOSIS — I480 Paroxysmal atrial fibrillation: Secondary | ICD-10-CM | POA: Diagnosis not present

## 2024-03-30 DIAGNOSIS — E861 Hypovolemia: Secondary | ICD-10-CM | POA: Diagnosis not present

## 2024-03-30 DIAGNOSIS — G9341 Metabolic encephalopathy: Secondary | ICD-10-CM | POA: Diagnosis not present

## 2024-03-30 DIAGNOSIS — R5381 Other malaise: Secondary | ICD-10-CM | POA: Diagnosis not present

## 2024-03-30 DIAGNOSIS — I2510 Atherosclerotic heart disease of native coronary artery without angina pectoris: Secondary | ICD-10-CM | POA: Diagnosis not present

## 2024-03-30 DIAGNOSIS — G894 Chronic pain syndrome: Secondary | ICD-10-CM | POA: Diagnosis not present

## 2024-03-30 DIAGNOSIS — N179 Acute kidney failure, unspecified: Secondary | ICD-10-CM | POA: Diagnosis not present

## 2024-03-30 DIAGNOSIS — E440 Moderate protein-calorie malnutrition: Secondary | ICD-10-CM | POA: Diagnosis not present

## 2024-03-30 DIAGNOSIS — R112 Nausea with vomiting, unspecified: Secondary | ICD-10-CM | POA: Diagnosis not present

## 2024-03-30 DIAGNOSIS — I959 Hypotension, unspecified: Secondary | ICD-10-CM | POA: Diagnosis not present

## 2024-03-30 DIAGNOSIS — D45 Polycythemia vera: Secondary | ICD-10-CM | POA: Diagnosis not present

## 2024-03-30 DIAGNOSIS — Z9484 Stem cells transplant status: Secondary | ICD-10-CM | POA: Diagnosis not present

## 2024-03-30 DIAGNOSIS — E860 Dehydration: Secondary | ICD-10-CM | POA: Diagnosis not present

## 2024-03-30 DIAGNOSIS — D7581 Myelofibrosis: Secondary | ICD-10-CM | POA: Diagnosis not present

## 2024-03-31 DIAGNOSIS — I2510 Atherosclerotic heart disease of native coronary artery without angina pectoris: Secondary | ICD-10-CM | POA: Diagnosis not present

## 2024-03-31 DIAGNOSIS — R5381 Other malaise: Secondary | ICD-10-CM | POA: Diagnosis not present

## 2024-03-31 DIAGNOSIS — D72829 Elevated white blood cell count, unspecified: Secondary | ICD-10-CM | POA: Diagnosis not present

## 2024-03-31 DIAGNOSIS — D471 Chronic myeloproliferative disease: Secondary | ICD-10-CM | POA: Diagnosis not present

## 2024-03-31 DIAGNOSIS — K219 Gastro-esophageal reflux disease without esophagitis: Secondary | ICD-10-CM | POA: Diagnosis not present

## 2024-03-31 DIAGNOSIS — G894 Chronic pain syndrome: Secondary | ICD-10-CM | POA: Diagnosis not present

## 2024-04-01 DIAGNOSIS — D471 Chronic myeloproliferative disease: Secondary | ICD-10-CM | POA: Diagnosis not present

## 2024-04-01 DIAGNOSIS — I4891 Unspecified atrial fibrillation: Secondary | ICD-10-CM | POA: Diagnosis not present

## 2024-04-01 DIAGNOSIS — Z9484 Stem cells transplant status: Secondary | ICD-10-CM | POA: Diagnosis not present

## 2024-04-01 DIAGNOSIS — E871 Hypo-osmolality and hyponatremia: Secondary | ICD-10-CM | POA: Diagnosis not present

## 2024-04-01 DIAGNOSIS — I82220 Acute embolism and thrombosis of inferior vena cava: Secondary | ICD-10-CM | POA: Diagnosis not present

## 2024-04-01 DIAGNOSIS — N179 Acute kidney failure, unspecified: Secondary | ICD-10-CM | POA: Diagnosis not present

## 2024-04-01 DIAGNOSIS — R112 Nausea with vomiting, unspecified: Secondary | ICD-10-CM | POA: Diagnosis not present

## 2024-04-02 DIAGNOSIS — I480 Paroxysmal atrial fibrillation: Secondary | ICD-10-CM | POA: Diagnosis not present

## 2024-04-02 DIAGNOSIS — R197 Diarrhea, unspecified: Secondary | ICD-10-CM | POA: Diagnosis not present

## 2024-04-02 DIAGNOSIS — E46 Unspecified protein-calorie malnutrition: Secondary | ICD-10-CM | POA: Diagnosis not present

## 2024-04-02 DIAGNOSIS — G9341 Metabolic encephalopathy: Secondary | ICD-10-CM | POA: Diagnosis not present

## 2024-04-02 DIAGNOSIS — Z681 Body mass index (BMI) 19 or less, adult: Secondary | ICD-10-CM | POA: Diagnosis not present

## 2024-04-02 DIAGNOSIS — D6959 Other secondary thrombocytopenia: Secondary | ICD-10-CM | POA: Diagnosis not present

## 2024-04-02 DIAGNOSIS — D7581 Myelofibrosis: Secondary | ICD-10-CM | POA: Diagnosis not present

## 2024-04-02 DIAGNOSIS — Z9484 Stem cells transplant status: Secondary | ICD-10-CM | POA: Diagnosis not present

## 2024-04-02 DIAGNOSIS — D471 Chronic myeloproliferative disease: Secondary | ICD-10-CM | POA: Diagnosis not present

## 2024-04-02 DIAGNOSIS — E440 Moderate protein-calorie malnutrition: Secondary | ICD-10-CM | POA: Diagnosis not present

## 2024-04-02 DIAGNOSIS — E860 Dehydration: Secondary | ICD-10-CM | POA: Diagnosis not present

## 2024-04-02 DIAGNOSIS — D6481 Anemia due to antineoplastic chemotherapy: Secondary | ICD-10-CM | POA: Diagnosis not present

## 2024-04-02 DIAGNOSIS — N179 Acute kidney failure, unspecified: Secondary | ICD-10-CM | POA: Diagnosis not present

## 2024-04-02 DIAGNOSIS — D45 Polycythemia vera: Secondary | ICD-10-CM | POA: Diagnosis not present

## 2024-04-02 DIAGNOSIS — I959 Hypotension, unspecified: Secondary | ICD-10-CM | POA: Diagnosis not present

## 2024-04-02 DIAGNOSIS — I491 Atrial premature depolarization: Secondary | ICD-10-CM | POA: Diagnosis not present

## 2024-04-02 DIAGNOSIS — R000 Tachycardia, unspecified: Secondary | ICD-10-CM | POA: Diagnosis not present

## 2024-04-02 DIAGNOSIS — R059 Cough, unspecified: Secondary | ICD-10-CM | POA: Diagnosis not present

## 2024-04-02 DIAGNOSIS — R112 Nausea with vomiting, unspecified: Secondary | ICD-10-CM | POA: Diagnosis not present

## 2024-04-02 DIAGNOSIS — D696 Thrombocytopenia, unspecified: Secondary | ICD-10-CM | POA: Diagnosis not present

## 2024-04-02 DIAGNOSIS — D649 Anemia, unspecified: Secondary | ICD-10-CM | POA: Diagnosis not present

## 2024-04-02 DIAGNOSIS — E876 Hypokalemia: Secondary | ICD-10-CM | POA: Diagnosis not present

## 2024-04-03 DIAGNOSIS — E860 Dehydration: Secondary | ICD-10-CM | POA: Diagnosis not present

## 2024-04-03 DIAGNOSIS — R112 Nausea with vomiting, unspecified: Secondary | ICD-10-CM | POA: Diagnosis not present

## 2024-04-03 DIAGNOSIS — Z9484 Stem cells transplant status: Secondary | ICD-10-CM | POA: Diagnosis not present

## 2024-04-03 DIAGNOSIS — E876 Hypokalemia: Secondary | ICD-10-CM | POA: Diagnosis not present

## 2024-04-03 DIAGNOSIS — D7581 Myelofibrosis: Secondary | ICD-10-CM | POA: Diagnosis not present

## 2024-04-03 DIAGNOSIS — I959 Hypotension, unspecified: Secondary | ICD-10-CM | POA: Diagnosis not present

## 2024-04-03 DIAGNOSIS — D471 Chronic myeloproliferative disease: Secondary | ICD-10-CM | POA: Diagnosis not present

## 2024-04-03 DIAGNOSIS — R197 Diarrhea, unspecified: Secondary | ICD-10-CM | POA: Diagnosis not present

## 2024-04-03 DIAGNOSIS — D6481 Anemia due to antineoplastic chemotherapy: Secondary | ICD-10-CM | POA: Diagnosis not present

## 2024-04-03 DIAGNOSIS — D696 Thrombocytopenia, unspecified: Secondary | ICD-10-CM | POA: Diagnosis not present

## 2024-04-04 DIAGNOSIS — R112 Nausea with vomiting, unspecified: Secondary | ICD-10-CM | POA: Diagnosis not present

## 2024-04-04 DIAGNOSIS — I959 Hypotension, unspecified: Secondary | ICD-10-CM | POA: Diagnosis not present

## 2024-04-04 DIAGNOSIS — Z9484 Stem cells transplant status: Secondary | ICD-10-CM | POA: Diagnosis not present

## 2024-04-04 DIAGNOSIS — D7581 Myelofibrosis: Secondary | ICD-10-CM | POA: Diagnosis not present

## 2024-04-04 DIAGNOSIS — E876 Hypokalemia: Secondary | ICD-10-CM | POA: Diagnosis not present

## 2024-04-04 DIAGNOSIS — E860 Dehydration: Secondary | ICD-10-CM | POA: Diagnosis not present

## 2024-04-04 DIAGNOSIS — R197 Diarrhea, unspecified: Secondary | ICD-10-CM | POA: Diagnosis not present

## 2024-04-04 DIAGNOSIS — D696 Thrombocytopenia, unspecified: Secondary | ICD-10-CM | POA: Diagnosis not present

## 2024-04-04 DIAGNOSIS — D6481 Anemia due to antineoplastic chemotherapy: Secondary | ICD-10-CM | POA: Diagnosis not present

## 2024-04-05 DIAGNOSIS — I959 Hypotension, unspecified: Secondary | ICD-10-CM | POA: Diagnosis not present

## 2024-04-05 DIAGNOSIS — D696 Thrombocytopenia, unspecified: Secondary | ICD-10-CM | POA: Diagnosis not present

## 2024-04-05 DIAGNOSIS — R112 Nausea with vomiting, unspecified: Secondary | ICD-10-CM | POA: Diagnosis not present

## 2024-04-05 DIAGNOSIS — R197 Diarrhea, unspecified: Secondary | ICD-10-CM | POA: Diagnosis not present

## 2024-04-05 DIAGNOSIS — Z9484 Stem cells transplant status: Secondary | ICD-10-CM | POA: Diagnosis not present

## 2024-04-05 DIAGNOSIS — D6481 Anemia due to antineoplastic chemotherapy: Secondary | ICD-10-CM | POA: Diagnosis not present

## 2024-04-05 DIAGNOSIS — D7581 Myelofibrosis: Secondary | ICD-10-CM | POA: Diagnosis not present

## 2024-04-05 DIAGNOSIS — E860 Dehydration: Secondary | ICD-10-CM | POA: Diagnosis not present

## 2024-04-05 DIAGNOSIS — E876 Hypokalemia: Secondary | ICD-10-CM | POA: Diagnosis not present

## 2024-04-06 DIAGNOSIS — D696 Thrombocytopenia, unspecified: Secondary | ICD-10-CM | POA: Diagnosis not present

## 2024-04-06 DIAGNOSIS — I491 Atrial premature depolarization: Secondary | ICD-10-CM | POA: Diagnosis not present

## 2024-04-06 DIAGNOSIS — D6481 Anemia due to antineoplastic chemotherapy: Secondary | ICD-10-CM | POA: Diagnosis not present

## 2024-04-06 DIAGNOSIS — Z9484 Stem cells transplant status: Secondary | ICD-10-CM | POA: Diagnosis not present

## 2024-04-06 DIAGNOSIS — R197 Diarrhea, unspecified: Secondary | ICD-10-CM | POA: Diagnosis not present

## 2024-04-06 DIAGNOSIS — R000 Tachycardia, unspecified: Secondary | ICD-10-CM | POA: Diagnosis not present

## 2024-04-06 DIAGNOSIS — R112 Nausea with vomiting, unspecified: Secondary | ICD-10-CM | POA: Diagnosis not present

## 2024-04-06 DIAGNOSIS — D7581 Myelofibrosis: Secondary | ICD-10-CM | POA: Diagnosis not present

## 2024-04-06 DIAGNOSIS — E860 Dehydration: Secondary | ICD-10-CM | POA: Diagnosis not present

## 2024-04-06 DIAGNOSIS — E876 Hypokalemia: Secondary | ICD-10-CM | POA: Diagnosis not present

## 2024-04-07 DIAGNOSIS — D649 Anemia, unspecified: Secondary | ICD-10-CM | POA: Diagnosis not present

## 2024-04-07 DIAGNOSIS — Z9481 Bone marrow transplant status: Secondary | ICD-10-CM | POA: Diagnosis not present

## 2024-04-07 DIAGNOSIS — I480 Paroxysmal atrial fibrillation: Secondary | ICD-10-CM | POA: Diagnosis not present

## 2024-04-07 DIAGNOSIS — I330 Acute and subacute infective endocarditis: Secondary | ICD-10-CM | POA: Diagnosis not present

## 2024-04-07 DIAGNOSIS — D45 Polycythemia vera: Secondary | ICD-10-CM | POA: Diagnosis not present

## 2024-04-07 DIAGNOSIS — Z9484 Stem cells transplant status: Secondary | ICD-10-CM | POA: Diagnosis not present

## 2024-04-07 DIAGNOSIS — D7581 Myelofibrosis: Secondary | ICD-10-CM | POA: Diagnosis not present

## 2024-04-07 DIAGNOSIS — R112 Nausea with vomiting, unspecified: Secondary | ICD-10-CM | POA: Diagnosis not present

## 2024-04-07 DIAGNOSIS — E46 Unspecified protein-calorie malnutrition: Secondary | ICD-10-CM | POA: Diagnosis not present

## 2024-04-07 DIAGNOSIS — R5381 Other malaise: Secondary | ICD-10-CM | POA: Diagnosis not present

## 2024-04-07 DIAGNOSIS — I4891 Unspecified atrial fibrillation: Secondary | ICD-10-CM | POA: Diagnosis not present

## 2024-04-07 DIAGNOSIS — D471 Chronic myeloproliferative disease: Secondary | ICD-10-CM | POA: Diagnosis not present

## 2024-04-07 DIAGNOSIS — G9341 Metabolic encephalopathy: Secondary | ICD-10-CM | POA: Diagnosis not present

## 2024-04-07 DIAGNOSIS — I959 Hypotension, unspecified: Secondary | ICD-10-CM | POA: Diagnosis not present

## 2024-04-07 DIAGNOSIS — D696 Thrombocytopenia, unspecified: Secondary | ICD-10-CM | POA: Diagnosis not present

## 2024-04-07 DIAGNOSIS — I420 Dilated cardiomyopathy: Secondary | ICD-10-CM | POA: Diagnosis not present

## 2024-04-07 DIAGNOSIS — R9439 Abnormal result of other cardiovascular function study: Secondary | ICD-10-CM | POA: Diagnosis not present

## 2024-04-09 DIAGNOSIS — D45 Polycythemia vera: Secondary | ICD-10-CM | POA: Diagnosis not present

## 2024-04-09 DIAGNOSIS — D7581 Myelofibrosis: Secondary | ICD-10-CM | POA: Diagnosis not present

## 2024-04-09 DIAGNOSIS — I4891 Unspecified atrial fibrillation: Secondary | ICD-10-CM | POA: Diagnosis not present

## 2024-04-09 DIAGNOSIS — D471 Chronic myeloproliferative disease: Secondary | ICD-10-CM | POA: Diagnosis not present

## 2024-04-09 DIAGNOSIS — R5381 Other malaise: Secondary | ICD-10-CM | POA: Diagnosis not present

## 2024-04-09 DIAGNOSIS — R112 Nausea with vomiting, unspecified: Secondary | ICD-10-CM | POA: Diagnosis not present

## 2024-04-09 DIAGNOSIS — Z9481 Bone marrow transplant status: Secondary | ICD-10-CM | POA: Diagnosis not present

## 2024-04-14 DIAGNOSIS — I420 Dilated cardiomyopathy: Secondary | ICD-10-CM | POA: Diagnosis not present

## 2024-04-14 DIAGNOSIS — I330 Acute and subacute infective endocarditis: Secondary | ICD-10-CM | POA: Diagnosis not present

## 2024-04-14 DIAGNOSIS — Z9481 Bone marrow transplant status: Secondary | ICD-10-CM | POA: Diagnosis not present

## 2024-04-14 DIAGNOSIS — R9439 Abnormal result of other cardiovascular function study: Secondary | ICD-10-CM | POA: Diagnosis not present

## 2024-04-14 DIAGNOSIS — D471 Chronic myeloproliferative disease: Secondary | ICD-10-CM | POA: Diagnosis not present

## 2024-04-14 DIAGNOSIS — D7581 Myelofibrosis: Secondary | ICD-10-CM | POA: Diagnosis not present

## 2024-04-14 DIAGNOSIS — D45 Polycythemia vera: Secondary | ICD-10-CM | POA: Diagnosis not present

## 2024-04-16 DIAGNOSIS — D45 Polycythemia vera: Secondary | ICD-10-CM | POA: Diagnosis not present

## 2024-04-16 DIAGNOSIS — D471 Chronic myeloproliferative disease: Secondary | ICD-10-CM | POA: Diagnosis not present

## 2024-04-16 DIAGNOSIS — D7581 Myelofibrosis: Secondary | ICD-10-CM | POA: Diagnosis not present

## 2024-04-16 DIAGNOSIS — Z9481 Bone marrow transplant status: Secondary | ICD-10-CM | POA: Diagnosis not present

## 2024-04-17 DIAGNOSIS — I4891 Unspecified atrial fibrillation: Secondary | ICD-10-CM | POA: Diagnosis not present

## 2024-04-17 DIAGNOSIS — R112 Nausea with vomiting, unspecified: Secondary | ICD-10-CM | POA: Diagnosis not present

## 2024-04-17 DIAGNOSIS — D471 Chronic myeloproliferative disease: Secondary | ICD-10-CM | POA: Diagnosis not present

## 2024-04-17 DIAGNOSIS — R5381 Other malaise: Secondary | ICD-10-CM | POA: Diagnosis not present

## 2024-04-20 DIAGNOSIS — D7581 Myelofibrosis: Secondary | ICD-10-CM | POA: Diagnosis not present

## 2024-04-20 DIAGNOSIS — D45 Polycythemia vera: Secondary | ICD-10-CM | POA: Diagnosis not present

## 2024-04-20 DIAGNOSIS — Z9481 Bone marrow transplant status: Secondary | ICD-10-CM | POA: Diagnosis not present

## 2024-04-20 DIAGNOSIS — D471 Chronic myeloproliferative disease: Secondary | ICD-10-CM | POA: Diagnosis not present

## 2024-04-20 DIAGNOSIS — L989 Disorder of the skin and subcutaneous tissue, unspecified: Secondary | ICD-10-CM | POA: Diagnosis not present

## 2024-04-22 DIAGNOSIS — Z9481 Bone marrow transplant status: Secondary | ICD-10-CM | POA: Diagnosis not present

## 2024-04-22 DIAGNOSIS — D7581 Myelofibrosis: Secondary | ICD-10-CM | POA: Diagnosis not present

## 2024-04-22 DIAGNOSIS — D45 Polycythemia vera: Secondary | ICD-10-CM | POA: Diagnosis not present

## 2024-04-23 DIAGNOSIS — R2681 Unsteadiness on feet: Secondary | ICD-10-CM | POA: Diagnosis not present

## 2024-04-23 DIAGNOSIS — Z9481 Bone marrow transplant status: Secondary | ICD-10-CM | POA: Diagnosis not present

## 2024-04-23 DIAGNOSIS — R29898 Other symptoms and signs involving the musculoskeletal system: Secondary | ICD-10-CM | POA: Diagnosis not present

## 2024-04-23 DIAGNOSIS — N179 Acute kidney failure, unspecified: Secondary | ICD-10-CM | POA: Diagnosis not present

## 2024-04-23 DIAGNOSIS — R627 Adult failure to thrive: Secondary | ICD-10-CM | POA: Diagnosis not present

## 2024-04-23 DIAGNOSIS — D5911 Warm autoimmune hemolytic anemia: Secondary | ICD-10-CM | POA: Diagnosis not present

## 2024-04-23 DIAGNOSIS — D45 Polycythemia vera: Secondary | ICD-10-CM | POA: Diagnosis not present

## 2024-04-23 DIAGNOSIS — D471 Chronic myeloproliferative disease: Secondary | ICD-10-CM | POA: Diagnosis not present

## 2024-04-23 DIAGNOSIS — D7581 Myelofibrosis: Secondary | ICD-10-CM | POA: Diagnosis not present

## 2024-04-27 DIAGNOSIS — R627 Adult failure to thrive: Secondary | ICD-10-CM | POA: Diagnosis not present

## 2024-04-27 DIAGNOSIS — D471 Chronic myeloproliferative disease: Secondary | ICD-10-CM | POA: Diagnosis not present

## 2024-04-27 DIAGNOSIS — D7581 Myelofibrosis: Secondary | ICD-10-CM | POA: Diagnosis not present

## 2024-04-27 DIAGNOSIS — D45 Polycythemia vera: Secondary | ICD-10-CM | POA: Diagnosis not present

## 2024-04-27 DIAGNOSIS — R2681 Unsteadiness on feet: Secondary | ICD-10-CM | POA: Diagnosis not present

## 2024-04-27 DIAGNOSIS — D5911 Warm autoimmune hemolytic anemia: Secondary | ICD-10-CM | POA: Diagnosis not present

## 2024-04-27 DIAGNOSIS — N179 Acute kidney failure, unspecified: Secondary | ICD-10-CM | POA: Diagnosis not present

## 2024-04-27 DIAGNOSIS — Z9481 Bone marrow transplant status: Secondary | ICD-10-CM | POA: Diagnosis not present

## 2024-04-27 DIAGNOSIS — R29898 Other symptoms and signs involving the musculoskeletal system: Secondary | ICD-10-CM | POA: Diagnosis not present

## 2024-04-28 DIAGNOSIS — E46 Unspecified protein-calorie malnutrition: Secondary | ICD-10-CM | POA: Diagnosis not present

## 2024-04-28 DIAGNOSIS — D471 Chronic myeloproliferative disease: Secondary | ICD-10-CM | POA: Diagnosis not present

## 2024-04-28 DIAGNOSIS — D45 Polycythemia vera: Secondary | ICD-10-CM | POA: Diagnosis not present

## 2024-04-28 DIAGNOSIS — Z742 Need for assistance at home and no other household member able to render care: Secondary | ICD-10-CM | POA: Diagnosis not present

## 2024-04-28 DIAGNOSIS — D7581 Myelofibrosis: Secondary | ICD-10-CM | POA: Diagnosis not present

## 2024-05-04 DIAGNOSIS — N179 Acute kidney failure, unspecified: Secondary | ICD-10-CM | POA: Diagnosis not present

## 2024-05-04 DIAGNOSIS — Z9481 Bone marrow transplant status: Secondary | ICD-10-CM | POA: Diagnosis not present

## 2024-05-04 DIAGNOSIS — R2681 Unsteadiness on feet: Secondary | ICD-10-CM | POA: Diagnosis not present

## 2024-05-04 DIAGNOSIS — D5911 Warm autoimmune hemolytic anemia: Secondary | ICD-10-CM | POA: Diagnosis not present

## 2024-05-04 DIAGNOSIS — D45 Polycythemia vera: Secondary | ICD-10-CM | POA: Diagnosis not present

## 2024-05-04 DIAGNOSIS — D471 Chronic myeloproliferative disease: Secondary | ICD-10-CM | POA: Diagnosis not present

## 2024-05-04 DIAGNOSIS — R627 Adult failure to thrive: Secondary | ICD-10-CM | POA: Diagnosis not present

## 2024-05-04 DIAGNOSIS — D7581 Myelofibrosis: Secondary | ICD-10-CM | POA: Diagnosis not present

## 2024-05-04 DIAGNOSIS — R29898 Other symptoms and signs involving the musculoskeletal system: Secondary | ICD-10-CM | POA: Diagnosis not present

## 2024-05-11 DIAGNOSIS — Z9481 Bone marrow transplant status: Secondary | ICD-10-CM | POA: Diagnosis not present

## 2024-05-11 DIAGNOSIS — D471 Chronic myeloproliferative disease: Secondary | ICD-10-CM | POA: Diagnosis not present

## 2024-05-11 DIAGNOSIS — D7581 Myelofibrosis: Secondary | ICD-10-CM | POA: Diagnosis not present

## 2024-05-11 DIAGNOSIS — D45 Polycythemia vera: Secondary | ICD-10-CM | POA: Diagnosis not present

## 2024-05-15 DIAGNOSIS — Z431 Encounter for attention to gastrostomy: Secondary | ICD-10-CM | POA: Diagnosis not present

## 2024-05-15 DIAGNOSIS — R627 Adult failure to thrive: Secondary | ICD-10-CM | POA: Diagnosis not present

## 2024-05-25 DIAGNOSIS — D7581 Myelofibrosis: Secondary | ICD-10-CM | POA: Diagnosis not present

## 2024-05-25 DIAGNOSIS — Z9481 Bone marrow transplant status: Secondary | ICD-10-CM | POA: Diagnosis not present

## 2024-05-25 DIAGNOSIS — D45 Polycythemia vera: Secondary | ICD-10-CM | POA: Diagnosis not present

## 2024-05-25 DIAGNOSIS — D471 Chronic myeloproliferative disease: Secondary | ICD-10-CM | POA: Diagnosis not present

## 2024-05-25 DIAGNOSIS — R197 Diarrhea, unspecified: Secondary | ICD-10-CM | POA: Diagnosis not present

## 2024-05-25 DIAGNOSIS — D849 Immunodeficiency, unspecified: Secondary | ICD-10-CM | POA: Diagnosis not present

## 2024-05-29 DIAGNOSIS — Z9481 Bone marrow transplant status: Secondary | ICD-10-CM | POA: Diagnosis not present

## 2024-05-29 DIAGNOSIS — R29898 Other symptoms and signs involving the musculoskeletal system: Secondary | ICD-10-CM | POA: Diagnosis not present

## 2024-05-29 DIAGNOSIS — N179 Acute kidney failure, unspecified: Secondary | ICD-10-CM | POA: Diagnosis not present

## 2024-05-29 DIAGNOSIS — D7581 Myelofibrosis: Secondary | ICD-10-CM | POA: Diagnosis not present

## 2024-05-29 DIAGNOSIS — R2681 Unsteadiness on feet: Secondary | ICD-10-CM | POA: Diagnosis not present

## 2024-05-29 DIAGNOSIS — D471 Chronic myeloproliferative disease: Secondary | ICD-10-CM | POA: Diagnosis not present

## 2024-05-29 DIAGNOSIS — R627 Adult failure to thrive: Secondary | ICD-10-CM | POA: Diagnosis not present

## 2024-05-29 DIAGNOSIS — D5911 Warm autoimmune hemolytic anemia: Secondary | ICD-10-CM | POA: Diagnosis not present

## 2024-05-29 DIAGNOSIS — D45 Polycythemia vera: Secondary | ICD-10-CM | POA: Diagnosis not present

## 2024-06-08 DIAGNOSIS — N179 Acute kidney failure, unspecified: Secondary | ICD-10-CM | POA: Diagnosis not present

## 2024-06-08 DIAGNOSIS — D5911 Warm autoimmune hemolytic anemia: Secondary | ICD-10-CM | POA: Diagnosis not present

## 2024-06-08 DIAGNOSIS — D471 Chronic myeloproliferative disease: Secondary | ICD-10-CM | POA: Diagnosis not present

## 2024-06-08 DIAGNOSIS — Z23 Encounter for immunization: Secondary | ICD-10-CM | POA: Diagnosis not present

## 2024-06-08 DIAGNOSIS — R197 Diarrhea, unspecified: Secondary | ICD-10-CM | POA: Diagnosis not present

## 2024-06-08 DIAGNOSIS — R29898 Other symptoms and signs involving the musculoskeletal system: Secondary | ICD-10-CM | POA: Diagnosis not present

## 2024-06-08 DIAGNOSIS — D45 Polycythemia vera: Secondary | ICD-10-CM | POA: Diagnosis not present

## 2024-06-08 DIAGNOSIS — D7581 Myelofibrosis: Secondary | ICD-10-CM | POA: Diagnosis not present

## 2024-06-08 DIAGNOSIS — D849 Immunodeficiency, unspecified: Secondary | ICD-10-CM | POA: Diagnosis not present

## 2024-06-08 DIAGNOSIS — R627 Adult failure to thrive: Secondary | ICD-10-CM | POA: Diagnosis not present

## 2024-06-08 DIAGNOSIS — Z9481 Bone marrow transplant status: Secondary | ICD-10-CM | POA: Diagnosis not present

## 2024-06-08 DIAGNOSIS — R2681 Unsteadiness on feet: Secondary | ICD-10-CM | POA: Diagnosis not present

## 2024-06-11 DIAGNOSIS — Z9481 Bone marrow transplant status: Secondary | ICD-10-CM | POA: Diagnosis not present

## 2024-06-11 DIAGNOSIS — N179 Acute kidney failure, unspecified: Secondary | ICD-10-CM | POA: Diagnosis not present

## 2024-06-16 DIAGNOSIS — Z9481 Bone marrow transplant status: Secondary | ICD-10-CM | POA: Diagnosis not present

## 2024-06-16 DIAGNOSIS — L989 Disorder of the skin and subcutaneous tissue, unspecified: Secondary | ICD-10-CM | POA: Diagnosis not present

## 2024-06-16 DIAGNOSIS — L858 Other specified epidermal thickening: Secondary | ICD-10-CM | POA: Diagnosis not present

## 2024-06-16 DIAGNOSIS — D471 Chronic myeloproliferative disease: Secondary | ICD-10-CM | POA: Diagnosis not present

## 2024-06-22 DIAGNOSIS — D45 Polycythemia vera: Secondary | ICD-10-CM | POA: Diagnosis not present

## 2024-06-22 DIAGNOSIS — Z7901 Long term (current) use of anticoagulants: Secondary | ICD-10-CM | POA: Diagnosis not present

## 2024-06-22 DIAGNOSIS — Z79899 Other long term (current) drug therapy: Secondary | ICD-10-CM | POA: Diagnosis not present

## 2024-06-22 DIAGNOSIS — N179 Acute kidney failure, unspecified: Secondary | ICD-10-CM | POA: Diagnosis not present

## 2024-06-22 DIAGNOSIS — D7581 Myelofibrosis: Secondary | ICD-10-CM | POA: Diagnosis not present

## 2024-06-22 DIAGNOSIS — D5911 Warm autoimmune hemolytic anemia: Secondary | ICD-10-CM | POA: Diagnosis not present

## 2024-06-22 DIAGNOSIS — R29898 Other symptoms and signs involving the musculoskeletal system: Secondary | ICD-10-CM | POA: Diagnosis not present

## 2024-06-22 DIAGNOSIS — Z79621 Long term (current) use of calcineurin inhibitor: Secondary | ICD-10-CM | POA: Diagnosis not present

## 2024-06-22 DIAGNOSIS — Z9481 Bone marrow transplant status: Secondary | ICD-10-CM | POA: Diagnosis not present

## 2024-06-22 DIAGNOSIS — D471 Chronic myeloproliferative disease: Secondary | ICD-10-CM | POA: Diagnosis not present

## 2024-06-22 DIAGNOSIS — R627 Adult failure to thrive: Secondary | ICD-10-CM | POA: Diagnosis not present

## 2024-06-22 DIAGNOSIS — R2681 Unsteadiness on feet: Secondary | ICD-10-CM | POA: Diagnosis not present

## 2024-06-24 DIAGNOSIS — Z87891 Personal history of nicotine dependence: Secondary | ICD-10-CM | POA: Diagnosis not present

## 2024-06-24 DIAGNOSIS — J984 Other disorders of lung: Secondary | ICD-10-CM | POA: Diagnosis not present

## 2024-07-06 DIAGNOSIS — D7581 Myelofibrosis: Secondary | ICD-10-CM | POA: Diagnosis not present

## 2024-07-06 DIAGNOSIS — D518 Other vitamin B12 deficiency anemias: Secondary | ICD-10-CM | POA: Diagnosis not present

## 2024-07-06 DIAGNOSIS — D89813 Graft-versus-host disease, unspecified: Secondary | ICD-10-CM | POA: Diagnosis not present

## 2024-07-06 DIAGNOSIS — D849 Immunodeficiency, unspecified: Secondary | ICD-10-CM | POA: Diagnosis not present

## 2024-07-06 DIAGNOSIS — N179 Acute kidney failure, unspecified: Secondary | ICD-10-CM | POA: Diagnosis not present

## 2024-07-06 DIAGNOSIS — Z9481 Bone marrow transplant status: Secondary | ICD-10-CM | POA: Diagnosis not present

## 2024-07-06 DIAGNOSIS — D45 Polycythemia vera: Secondary | ICD-10-CM | POA: Diagnosis not present

## 2024-07-06 DIAGNOSIS — D471 Chronic myeloproliferative disease: Secondary | ICD-10-CM | POA: Diagnosis not present

## 2024-07-13 DIAGNOSIS — D45 Polycythemia vera: Secondary | ICD-10-CM | POA: Diagnosis not present

## 2024-07-13 DIAGNOSIS — D518 Other vitamin B12 deficiency anemias: Secondary | ICD-10-CM | POA: Diagnosis not present

## 2024-07-13 DIAGNOSIS — D7581 Myelofibrosis: Secondary | ICD-10-CM | POA: Diagnosis not present

## 2024-07-13 DIAGNOSIS — Z9481 Bone marrow transplant status: Secondary | ICD-10-CM | POA: Diagnosis not present

## 2024-07-21 DIAGNOSIS — Z9481 Bone marrow transplant status: Secondary | ICD-10-CM | POA: Diagnosis not present

## 2024-07-21 DIAGNOSIS — D45 Polycythemia vera: Secondary | ICD-10-CM | POA: Diagnosis not present

## 2024-07-21 DIAGNOSIS — D7581 Myelofibrosis: Secondary | ICD-10-CM | POA: Diagnosis not present

## 2024-07-27 DIAGNOSIS — D45 Polycythemia vera: Secondary | ICD-10-CM | POA: Diagnosis not present

## 2024-07-27 DIAGNOSIS — D7581 Myelofibrosis: Secondary | ICD-10-CM | POA: Diagnosis not present

## 2024-07-27 DIAGNOSIS — Z9481 Bone marrow transplant status: Secondary | ICD-10-CM | POA: Diagnosis not present

## 2024-08-03 DIAGNOSIS — D471 Chronic myeloproliferative disease: Secondary | ICD-10-CM | POA: Diagnosis not present

## 2024-08-03 DIAGNOSIS — D5911 Warm autoimmune hemolytic anemia: Secondary | ICD-10-CM | POA: Diagnosis not present

## 2024-08-03 DIAGNOSIS — N179 Acute kidney failure, unspecified: Secondary | ICD-10-CM | POA: Diagnosis not present

## 2024-08-03 DIAGNOSIS — D7581 Myelofibrosis: Secondary | ICD-10-CM | POA: Diagnosis not present

## 2024-08-03 DIAGNOSIS — D45 Polycythemia vera: Secondary | ICD-10-CM | POA: Diagnosis not present

## 2024-08-03 DIAGNOSIS — Z23 Encounter for immunization: Secondary | ICD-10-CM | POA: Diagnosis not present

## 2024-08-03 DIAGNOSIS — R29898 Other symptoms and signs involving the musculoskeletal system: Secondary | ICD-10-CM | POA: Diagnosis not present

## 2024-08-03 DIAGNOSIS — R2681 Unsteadiness on feet: Secondary | ICD-10-CM | POA: Diagnosis not present

## 2024-08-03 DIAGNOSIS — Z9481 Bone marrow transplant status: Secondary | ICD-10-CM | POA: Diagnosis not present

## 2024-08-03 DIAGNOSIS — R627 Adult failure to thrive: Secondary | ICD-10-CM | POA: Diagnosis not present

## 2024-08-04 DIAGNOSIS — C44622 Squamous cell carcinoma of skin of right upper limb, including shoulder: Secondary | ICD-10-CM | POA: Diagnosis not present

## 2024-08-04 DIAGNOSIS — C44612 Basal cell carcinoma of skin of right upper limb, including shoulder: Secondary | ICD-10-CM | POA: Diagnosis not present

## 2024-08-10 DIAGNOSIS — D7581 Myelofibrosis: Secondary | ICD-10-CM | POA: Diagnosis not present

## 2024-08-10 DIAGNOSIS — D45 Polycythemia vera: Secondary | ICD-10-CM | POA: Diagnosis not present

## 2024-08-11 DIAGNOSIS — R2681 Unsteadiness on feet: Secondary | ICD-10-CM | POA: Diagnosis not present

## 2024-08-11 DIAGNOSIS — R627 Adult failure to thrive: Secondary | ICD-10-CM | POA: Diagnosis not present

## 2024-08-11 DIAGNOSIS — Z9481 Bone marrow transplant status: Secondary | ICD-10-CM | POA: Diagnosis not present

## 2024-08-11 DIAGNOSIS — D5911 Warm autoimmune hemolytic anemia: Secondary | ICD-10-CM | POA: Diagnosis not present

## 2024-08-11 DIAGNOSIS — D471 Chronic myeloproliferative disease: Secondary | ICD-10-CM | POA: Diagnosis not present

## 2024-08-11 DIAGNOSIS — D7581 Myelofibrosis: Secondary | ICD-10-CM | POA: Diagnosis not present

## 2024-08-11 DIAGNOSIS — N179 Acute kidney failure, unspecified: Secondary | ICD-10-CM | POA: Diagnosis not present

## 2024-08-11 DIAGNOSIS — D45 Polycythemia vera: Secondary | ICD-10-CM | POA: Diagnosis not present

## 2024-08-11 DIAGNOSIS — R29898 Other symptoms and signs involving the musculoskeletal system: Secondary | ICD-10-CM | POA: Diagnosis not present

## 2024-08-17 DIAGNOSIS — D7581 Myelofibrosis: Secondary | ICD-10-CM | POA: Diagnosis not present

## 2024-08-17 DIAGNOSIS — D45 Polycythemia vera: Secondary | ICD-10-CM | POA: Diagnosis not present

## 2024-08-18 DIAGNOSIS — Z4802 Encounter for removal of sutures: Secondary | ICD-10-CM | POA: Diagnosis not present

## 2024-08-24 DIAGNOSIS — D471 Chronic myeloproliferative disease: Secondary | ICD-10-CM | POA: Diagnosis not present

## 2024-08-24 DIAGNOSIS — R627 Adult failure to thrive: Secondary | ICD-10-CM | POA: Diagnosis not present

## 2024-08-24 DIAGNOSIS — D7581 Myelofibrosis: Secondary | ICD-10-CM | POA: Diagnosis not present

## 2024-08-24 DIAGNOSIS — N179 Acute kidney failure, unspecified: Secondary | ICD-10-CM | POA: Diagnosis not present

## 2024-08-24 DIAGNOSIS — R2681 Unsteadiness on feet: Secondary | ICD-10-CM | POA: Diagnosis not present

## 2024-08-24 DIAGNOSIS — Z9481 Bone marrow transplant status: Secondary | ICD-10-CM | POA: Diagnosis not present

## 2024-08-24 DIAGNOSIS — R29898 Other symptoms and signs involving the musculoskeletal system: Secondary | ICD-10-CM | POA: Diagnosis not present

## 2024-08-24 DIAGNOSIS — D5911 Warm autoimmune hemolytic anemia: Secondary | ICD-10-CM | POA: Diagnosis not present

## 2024-08-24 DIAGNOSIS — D45 Polycythemia vera: Secondary | ICD-10-CM | POA: Diagnosis not present

## 2024-08-31 DIAGNOSIS — D7581 Myelofibrosis: Secondary | ICD-10-CM | POA: Diagnosis not present

## 2024-08-31 DIAGNOSIS — Z9481 Bone marrow transplant status: Secondary | ICD-10-CM | POA: Diagnosis not present

## 2024-08-31 DIAGNOSIS — D471 Chronic myeloproliferative disease: Secondary | ICD-10-CM | POA: Diagnosis not present

## 2024-08-31 DIAGNOSIS — R627 Adult failure to thrive: Secondary | ICD-10-CM | POA: Diagnosis not present

## 2024-08-31 DIAGNOSIS — D5911 Warm autoimmune hemolytic anemia: Secondary | ICD-10-CM | POA: Diagnosis not present

## 2024-08-31 DIAGNOSIS — R2681 Unsteadiness on feet: Secondary | ICD-10-CM | POA: Diagnosis not present

## 2024-08-31 DIAGNOSIS — R29898 Other symptoms and signs involving the musculoskeletal system: Secondary | ICD-10-CM | POA: Diagnosis not present

## 2024-08-31 DIAGNOSIS — N179 Acute kidney failure, unspecified: Secondary | ICD-10-CM | POA: Diagnosis not present

## 2024-08-31 DIAGNOSIS — D45 Polycythemia vera: Secondary | ICD-10-CM | POA: Diagnosis not present

## 2024-09-03 DIAGNOSIS — Z13820 Encounter for screening for osteoporosis: Secondary | ICD-10-CM | POA: Diagnosis not present

## 2024-09-03 DIAGNOSIS — D7581 Myelofibrosis: Secondary | ICD-10-CM | POA: Diagnosis not present

## 2024-09-03 DIAGNOSIS — E785 Hyperlipidemia, unspecified: Secondary | ICD-10-CM | POA: Diagnosis not present

## 2024-09-03 DIAGNOSIS — Z1331 Encounter for screening for depression: Secondary | ICD-10-CM | POA: Diagnosis not present

## 2024-09-03 DIAGNOSIS — Z0000 Encounter for general adult medical examination without abnormal findings: Secondary | ICD-10-CM | POA: Diagnosis not present

## 2024-09-03 DIAGNOSIS — I10 Essential (primary) hypertension: Secondary | ICD-10-CM | POA: Diagnosis not present

## 2024-09-03 DIAGNOSIS — G3184 Mild cognitive impairment, so stated: Secondary | ICD-10-CM | POA: Diagnosis not present

## 2024-09-03 DIAGNOSIS — N400 Enlarged prostate without lower urinary tract symptoms: Secondary | ICD-10-CM | POA: Diagnosis not present

## 2024-09-03 DIAGNOSIS — Z1211 Encounter for screening for malignant neoplasm of colon: Secondary | ICD-10-CM | POA: Diagnosis not present

## 2024-09-07 DIAGNOSIS — Z9481 Bone marrow transplant status: Secondary | ICD-10-CM | POA: Diagnosis not present

## 2024-09-07 DIAGNOSIS — D7581 Myelofibrosis: Secondary | ICD-10-CM | POA: Diagnosis not present

## 2024-09-07 DIAGNOSIS — D471 Chronic myeloproliferative disease: Secondary | ICD-10-CM | POA: Diagnosis not present

## 2024-09-07 DIAGNOSIS — D45 Polycythemia vera: Secondary | ICD-10-CM | POA: Diagnosis not present

## 2024-09-11 ENCOUNTER — Other Ambulatory Visit: Payer: Self-pay | Admitting: Nurse Practitioner

## 2024-09-11 DIAGNOSIS — Z13820 Encounter for screening for osteoporosis: Secondary | ICD-10-CM

## 2024-09-14 DIAGNOSIS — Z7901 Long term (current) use of anticoagulants: Secondary | ICD-10-CM | POA: Diagnosis not present

## 2024-09-14 DIAGNOSIS — I480 Paroxysmal atrial fibrillation: Secondary | ICD-10-CM | POA: Diagnosis not present

## 2024-09-14 DIAGNOSIS — D5911 Warm autoimmune hemolytic anemia: Secondary | ICD-10-CM | POA: Diagnosis not present

## 2024-09-14 DIAGNOSIS — R2681 Unsteadiness on feet: Secondary | ICD-10-CM | POA: Diagnosis not present

## 2024-09-14 DIAGNOSIS — N179 Acute kidney failure, unspecified: Secondary | ICD-10-CM | POA: Diagnosis not present

## 2024-09-14 DIAGNOSIS — D471 Chronic myeloproliferative disease: Secondary | ICD-10-CM | POA: Diagnosis not present

## 2024-09-14 DIAGNOSIS — R627 Adult failure to thrive: Secondary | ICD-10-CM | POA: Diagnosis not present

## 2024-09-14 DIAGNOSIS — I5020 Unspecified systolic (congestive) heart failure: Secondary | ICD-10-CM | POA: Diagnosis not present

## 2024-09-14 DIAGNOSIS — D45 Polycythemia vera: Secondary | ICD-10-CM | POA: Diagnosis not present

## 2024-09-14 DIAGNOSIS — R29898 Other symptoms and signs involving the musculoskeletal system: Secondary | ICD-10-CM | POA: Diagnosis not present

## 2024-09-14 DIAGNOSIS — D7581 Myelofibrosis: Secondary | ICD-10-CM | POA: Diagnosis not present

## 2024-09-14 DIAGNOSIS — Z9481 Bone marrow transplant status: Secondary | ICD-10-CM | POA: Diagnosis not present

## 2024-09-15 DIAGNOSIS — I499 Cardiac arrhythmia, unspecified: Secondary | ICD-10-CM | POA: Diagnosis not present

## 2024-09-21 DIAGNOSIS — D7581 Myelofibrosis: Secondary | ICD-10-CM | POA: Diagnosis not present

## 2024-09-21 DIAGNOSIS — Z9481 Bone marrow transplant status: Secondary | ICD-10-CM | POA: Diagnosis not present

## 2024-09-21 DIAGNOSIS — D45 Polycythemia vera: Secondary | ICD-10-CM | POA: Diagnosis not present

## 2024-09-24 ENCOUNTER — Ambulatory Visit (HOSPITAL_BASED_OUTPATIENT_CLINIC_OR_DEPARTMENT_OTHER)
Admission: RE | Admit: 2024-09-24 | Discharge: 2024-09-24 | Disposition: A | Discharge status: Home / Self Care | Source: Ambulatory Visit | Attending: Nurse Practitioner | Admitting: Nurse Practitioner

## 2024-09-24 DIAGNOSIS — Z87310 Personal history of (healed) osteoporosis fracture: Secondary | ICD-10-CM | POA: Diagnosis not present

## 2024-09-24 DIAGNOSIS — Z13820 Encounter for screening for osteoporosis: Secondary | ICD-10-CM | POA: Insufficient documentation

## 2024-09-28 DIAGNOSIS — D471 Chronic myeloproliferative disease: Secondary | ICD-10-CM | POA: Diagnosis not present

## 2024-09-28 DIAGNOSIS — R627 Adult failure to thrive: Secondary | ICD-10-CM | POA: Diagnosis not present

## 2024-09-28 DIAGNOSIS — D5911 Warm autoimmune hemolytic anemia: Secondary | ICD-10-CM | POA: Diagnosis not present

## 2024-09-28 DIAGNOSIS — Z789 Other specified health status: Secondary | ICD-10-CM | POA: Diagnosis not present

## 2024-09-28 DIAGNOSIS — Z9481 Bone marrow transplant status: Secondary | ICD-10-CM | POA: Diagnosis not present

## 2024-10-01 DIAGNOSIS — Z87891 Personal history of nicotine dependence: Secondary | ICD-10-CM | POA: Diagnosis not present

## 2024-10-01 DIAGNOSIS — Z9481 Bone marrow transplant status: Secondary | ICD-10-CM | POA: Diagnosis not present

## 2024-10-01 DIAGNOSIS — D471 Chronic myeloproliferative disease: Secondary | ICD-10-CM | POA: Diagnosis not present
